# Patient Record
Sex: Female | Born: 1985
Health system: Southern US, Community
[De-identification: ages and names within clinical notes are randomized; demographics above are authoritative.]

## PROBLEM LIST (undated history)

## (undated) ENCOUNTER — Inpatient Hospital Stay (HOSPITAL_COMMUNITY): Payer: Self-pay

## (undated) DIAGNOSIS — E282 Polycystic ovarian syndrome: Secondary | ICD-10-CM

## (undated) DIAGNOSIS — D509 Iron deficiency anemia, unspecified: Secondary | ICD-10-CM

## (undated) DIAGNOSIS — D649 Anemia, unspecified: Secondary | ICD-10-CM

## (undated) DIAGNOSIS — Z9889 Other specified postprocedural states: Secondary | ICD-10-CM

## (undated) DIAGNOSIS — K5090 Crohn's disease, unspecified, without complications: Secondary | ICD-10-CM

## (undated) DIAGNOSIS — Z789 Other specified health status: Secondary | ICD-10-CM

## (undated) DIAGNOSIS — Z5189 Encounter for other specified aftercare: Secondary | ICD-10-CM

## (undated) DIAGNOSIS — F419 Anxiety disorder, unspecified: Secondary | ICD-10-CM

## (undated) HISTORY — PX: WISDOM TOOTH EXTRACTION: SHX21

## (undated) HISTORY — DX: Encounter for other specified aftercare: Z51.89

## (undated) HISTORY — DX: Anemia, unspecified: D64.9

## (undated) HISTORY — PX: DILATION AND CURETTAGE OF UTERUS: SHX78

## (undated) HISTORY — DX: Anxiety disorder, unspecified: F41.9

---
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
## 2003-05-03 ENCOUNTER — Inpatient Hospital Stay: Admission: AD | Admit: 2003-05-03 | Discharge: 2003-05-03 | Payer: Self-pay | Admitting: Obstetrics

## 2004-04-07 ENCOUNTER — Inpatient Hospital Stay (HOSPITAL_COMMUNITY): Admission: AD | Admit: 2004-04-07 | Discharge: 2004-04-07 | Payer: Self-pay | Admitting: Obstetrics

## 2005-12-08 ENCOUNTER — Emergency Department (HOSPITAL_COMMUNITY): Admission: EM | Admit: 2005-12-08 | Discharge: 2005-12-08 | Payer: Self-pay | Admitting: Family Medicine

## 2006-01-25 ENCOUNTER — Emergency Department (HOSPITAL_COMMUNITY): Admission: EM | Admit: 2006-01-25 | Discharge: 2006-01-25 | Payer: Self-pay | Admitting: Family Medicine

## 2008-01-28 ENCOUNTER — Other Ambulatory Visit: Payer: Self-pay | Admitting: Family Medicine

## 2008-01-28 ENCOUNTER — Other Ambulatory Visit: Payer: Self-pay | Admitting: Emergency Medicine

## 2008-01-28 ENCOUNTER — Inpatient Hospital Stay (HOSPITAL_COMMUNITY): Admission: AD | Admit: 2008-01-28 | Discharge: 2008-01-28 | Payer: Self-pay | Admitting: Obstetrics & Gynecology

## 2008-02-09 ENCOUNTER — Inpatient Hospital Stay (HOSPITAL_COMMUNITY): Admission: AD | Admit: 2008-02-09 | Discharge: 2008-02-09 | Payer: Self-pay | Admitting: Obstetrics & Gynecology

## 2008-04-25 ENCOUNTER — Ambulatory Visit (HOSPITAL_COMMUNITY): Admission: RE | Admit: 2008-04-25 | Discharge: 2008-04-25 | Payer: Self-pay | Admitting: Obstetrics

## 2008-05-21 ENCOUNTER — Inpatient Hospital Stay (HOSPITAL_COMMUNITY): Admission: AD | Admit: 2008-05-21 | Discharge: 2008-05-21 | Payer: Self-pay | Admitting: Obstetrics

## 2008-07-06 ENCOUNTER — Inpatient Hospital Stay (HOSPITAL_COMMUNITY): Admission: AD | Admit: 2008-07-06 | Discharge: 2008-07-06 | Payer: Self-pay | Admitting: Obstetrics & Gynecology

## 2008-07-06 ENCOUNTER — Ambulatory Visit: Payer: Self-pay | Admitting: Advanced Practice Midwife

## 2008-09-24 ENCOUNTER — Inpatient Hospital Stay (HOSPITAL_COMMUNITY): Admission: RE | Admit: 2008-09-24 | Discharge: 2008-09-29 | Payer: Self-pay | Admitting: Obstetrics

## 2009-11-20 ENCOUNTER — Encounter: Admission: RE | Admit: 2009-11-20 | Discharge: 2009-11-20 | Payer: Self-pay | Admitting: Infectious Diseases

## 2010-05-22 LAB — CROSSMATCH
ABO/RH(D): AB POS
Antibody Screen: NEGATIVE

## 2010-05-22 LAB — COMPREHENSIVE METABOLIC PANEL
ALT: 27 U/L (ref 0–35)
Alkaline Phosphatase: 140 U/L — ABNORMAL HIGH (ref 39–117)
BUN: 8 mg/dL (ref 6–23)
CO2: 20 mEq/L (ref 19–32)
GFR calc non Af Amer: >60 mL/min (ref >60)
Glucose, Bld: 95 mg/dL (ref 70–99)
Potassium: 4.2 mEq/L (ref 3.5–5.1)
Sodium: 137 mEq/L (ref 135–145)
Total Bilirubin: 0.5 mg/dL (ref 0.3–1.2)

## 2010-05-22 LAB — CBC
HCT: 21.2 % — ABNORMAL LOW (ref 36.0–46.0)
HCT: 34.1 % — ABNORMAL LOW (ref 36.0–46.0)
Hemoglobin: 11.5 g/dL — ABNORMAL LOW (ref 12.0–15.0)
Hemoglobin: 5.5 g/dL — CL (ref 12.0–15.0)
MCHC: 33.4 g/dL (ref 30.0–36.0)
MCHC: 34 g/dL (ref 30.0–36.0)
MCV: 89 fL (ref 78.0–100.0)
MCV: 86.8 fL (ref 78.0–100.0)
Platelets: 193 10*3/uL (ref 150–400)
Platelets: 187 10*3/uL (ref 150–400)
Platelets: 216 10*3/uL (ref 150–400)
RBC: 2.39 MIL/uL — ABNORMAL LOW (ref 3.87–5.11)
RBC: 1.88 MIL/uL — ABNORMAL LOW (ref 3.87–5.11)
RDW: 14.3 % (ref 11.5–15.5)
WBC: 13.6 10*3/uL — ABNORMAL HIGH (ref 4.0–10.5)
WBC: 15 10*3/uL — ABNORMAL HIGH (ref 4.0–10.5)
WBC: 6.8 10*3/uL (ref 4.0–10.5)

## 2010-05-22 LAB — APTT: aPTT: 26 seconds (ref 24–37)

## 2010-05-22 LAB — PROTIME-INR
INR: 0.9 (ref 0.00–1.49)
Prothrombin Time: 12.5 seconds (ref 11.6–15.2)

## 2010-05-22 LAB — LACTATE DEHYDROGENASE: LDH: 250 U/L (ref 94–250)

## 2010-05-22 LAB — URIC ACID: Uric Acid, Serum: 3.4 mg/dL (ref 2.4–7.0)

## 2010-05-25 LAB — URINALYSIS, ROUTINE W REFLEX MICROSCOPIC
Glucose, UA: NEGATIVE mg/dL
Protein, ur: NEGATIVE mg/dL
Specific Gravity, Urine: 1.01 (ref 1.005–1.030)
Urobilinogen, UA: 0.2 mg/dL (ref 0.0–1.0)

## 2010-05-25 LAB — WET PREP, GENITAL

## 2010-05-26 LAB — URINALYSIS, ROUTINE W REFLEX MICROSCOPIC
Ketones, ur: NEGATIVE mg/dL
Nitrite: NEGATIVE
Protein, ur: NEGATIVE mg/dL

## 2010-07-02 NOTE — Op Note (Signed)
[facility]  Patient:    Michelle Pacheco                       MRN: [id number] Attending:  Kathreen Cosier, M.D.                           Operative Report  PREOPERATIVE DIAGNOSIS:       Fetal demise at 8 weeks.  POSTOPERATIVE DIAGNOSIS:  PROCEDURE:                    Dilatation and evacuation.  SURGEON:                      Kathreen Cosier, M.D.  ANESTHESIA:                   MAC.  DESCRIPTION OF PROCEDURE:     Using MAC with the patient in lithotomy position, the perineum was prepped and draped and the bladder emptied with a straight catheter. Bimanual exam revealed the uterus to be 8-week size. A weighted speculum was placed in the vagina. The cervix was injected with 1% Xylocaine with a total of 7 cc at 3, 9, and 12 oclock. Anterior lip of the cervix was grasped with a tenaculum. The cervix was dilated to #30 Shawnie Pons and a #9 suction used to aspirate uterine contents until cavity was clean. The patient tolerated the procedure well and taken to the recovery room in good condition. DD:  05/16/00 TD:  05/16/00 Job: [id number]

## 2010-11-18 LAB — HCG, QUANTITATIVE, PREGNANCY: hCG, Beta Chain, Quant, S: 60824 m[IU]/mL — ABNORMAL HIGH (ref <5)

## 2010-11-18 LAB — WET PREP, GENITAL
Clue Cells Wet Prep HPF POC: NONE SEEN
Trich, Wet Prep: NONE SEEN

## 2010-11-18 LAB — GC/CHLAMYDIA PROBE AMP, GENITAL: GC Probe Amp, Genital: NEGATIVE

## 2010-11-18 LAB — CBC
MCHC: 33.9 g/dL (ref 30.0–36.0)
Platelets: 218 10*3/uL (ref 150–400)
RDW: 14.1 % (ref 11.5–15.5)

## 2010-11-19 LAB — HCG, QUANTITATIVE, PREGNANCY: hCG, Beta Chain, Quant, S: 59101 m[IU]/mL — ABNORMAL HIGH (ref <5)

## 2010-11-19 LAB — POCT URINALYSIS DIP (DEVICE)
Glucose, UA: NEGATIVE mg/dL
Nitrite: NEGATIVE
Urobilinogen, UA: 0.2 mg/dL (ref 0.0–1.0)

## 2010-11-19 LAB — GC/CHLAMYDIA PROBE AMP, GENITAL: GC Probe Amp, Genital: NEGATIVE

## 2010-11-19 LAB — POCT PREGNANCY, URINE: Preg Test, Ur: POSITIVE

## 2010-11-19 LAB — POCT I-STAT, CHEM 8
Chloride: 103 mEq/L (ref 96–112)
Glucose, Bld: 83 mg/dL (ref 70–99)
HCT: 45 % (ref 36.0–46.0)
Potassium: 4 mEq/L (ref 3.5–5.1)

## 2010-11-19 LAB — WET PREP, GENITAL

## 2011-03-10 ENCOUNTER — Other Ambulatory Visit (HOSPITAL_COMMUNITY)
Admission: RE | Admit: 2011-03-10 | Discharge: 2011-03-10 | Disposition: A | Discharge status: Home / Self Care | Payer: Commercial Managed Care - PPO | Source: Ambulatory Visit | Attending: Obstetrics and Gynecology | Admitting: Obstetrics and Gynecology

## 2011-03-10 ENCOUNTER — Other Ambulatory Visit: Payer: Self-pay | Admitting: Nurse Practitioner

## 2011-03-10 DIAGNOSIS — Z1159 Encounter for screening for other viral diseases: Secondary | ICD-10-CM | POA: Insufficient documentation

## 2011-03-10 DIAGNOSIS — Z113 Encounter for screening for infections with a predominantly sexual mode of transmission: Secondary | ICD-10-CM | POA: Insufficient documentation

## 2011-03-10 DIAGNOSIS — Z01419 Encounter for gynecological examination (general) (routine) without abnormal findings: Secondary | ICD-10-CM | POA: Insufficient documentation

## 2011-09-26 LAB — OB RESULTS CONSOLE HEPATITIS B SURFACE ANTIGEN: Hepatitis B Surface Ag: NEGATIVE

## 2011-10-31 ENCOUNTER — Other Ambulatory Visit: Payer: Self-pay | Admitting: Obstetrics

## 2011-10-31 DIAGNOSIS — Z0489 Encounter for examination and observation for other specified reasons: Secondary | ICD-10-CM

## 2011-10-31 DIAGNOSIS — IMO0002 Reserved for concepts with insufficient information to code with codable children: Secondary | ICD-10-CM

## 2011-11-14 ENCOUNTER — Ambulatory Visit (HOSPITAL_COMMUNITY)
Admission: RE | Admit: 2011-11-14 | Discharge: 2011-11-14 | Disposition: A | Discharge status: Home / Self Care | Payer: Commercial Managed Care - PPO | Source: Ambulatory Visit | Attending: Obstetrics | Admitting: Obstetrics

## 2011-11-14 DIAGNOSIS — Z1389 Encounter for screening for other disorder: Secondary | ICD-10-CM | POA: Insufficient documentation

## 2011-11-14 DIAGNOSIS — IMO0002 Reserved for concepts with insufficient information to code with codable children: Secondary | ICD-10-CM

## 2011-11-14 DIAGNOSIS — Z0489 Encounter for examination and observation for other specified reasons: Secondary | ICD-10-CM

## 2011-11-14 DIAGNOSIS — O358XX0 Maternal care for other (suspected) fetal abnormality and damage, not applicable or unspecified: Secondary | ICD-10-CM | POA: Insufficient documentation

## 2011-11-14 DIAGNOSIS — Z363 Encounter for antenatal screening for malformations: Secondary | ICD-10-CM | POA: Insufficient documentation

## 2012-01-23 ENCOUNTER — Other Ambulatory Visit: Payer: Self-pay | Admitting: Obstetrics

## 2012-01-23 DIAGNOSIS — IMO0002 Reserved for concepts with insufficient information to code with codable children: Secondary | ICD-10-CM

## 2012-01-23 DIAGNOSIS — O359XX0 Maternal care for (suspected) fetal abnormality and damage, unspecified, not applicable or unspecified: Secondary | ICD-10-CM

## 2012-01-30 ENCOUNTER — Ambulatory Visit (HOSPITAL_COMMUNITY): Payer: Commercial Managed Care - PPO

## 2012-02-06 ENCOUNTER — Ambulatory Visit (HOSPITAL_COMMUNITY)
Admission: RE | Admit: 2012-02-06 | Discharge: 2012-02-06 | Disposition: A | Discharge status: Home / Self Care | Payer: Commercial Managed Care - PPO | Source: Ambulatory Visit | Attending: Obstetrics | Admitting: Obstetrics

## 2012-02-06 DIAGNOSIS — O358XX0 Maternal care for other (suspected) fetal abnormality and damage, not applicable or unspecified: Secondary | ICD-10-CM | POA: Insufficient documentation

## 2012-02-06 DIAGNOSIS — IMO0002 Reserved for concepts with insufficient information to code with codable children: Secondary | ICD-10-CM

## 2012-02-06 DIAGNOSIS — O3660X0 Maternal care for excessive fetal growth, unspecified trimester, not applicable or unspecified: Secondary | ICD-10-CM | POA: Insufficient documentation

## 2012-02-06 DIAGNOSIS — O09299 Supervision of pregnancy with other poor reproductive or obstetric history, unspecified trimester: Secondary | ICD-10-CM | POA: Insufficient documentation

## 2012-02-15 NOTE — L&D Delivery Note (Signed)
Delivery Note At 11:55 PM a viable female was delivered via Vaginal, Spontaneous Delivery (Presentation: ROA ;  ).  APGAR: 9, 9; weight: 3250 grams.   Placenta status: Intact, Spontaneous.  Cord: 3 vessels with the following complications: None.  Cord pH: none  Anesthesia: None Episiotomy: None Lacerations: None Suture Repair: none Est. Blood Loss (mL): 400  Mom to postpartum.  Baby to nursery-stable.  HARPER,CHARLES A 04/18/2012, 12:29 AM

## 2012-03-09 LAB — OB RESULTS CONSOLE GBS: GBS: POSITIVE

## 2012-04-09 ENCOUNTER — Encounter (HOSPITAL_COMMUNITY): Payer: Self-pay | Admitting: *Deleted

## 2012-04-09 ENCOUNTER — Inpatient Hospital Stay (HOSPITAL_COMMUNITY)
Admission: AD | Admit: 2012-04-09 | Discharge: 2012-04-09 | Disposition: A | Discharge status: Home / Self Care | Payer: Commercial Managed Care - PPO | Source: Ambulatory Visit | Attending: Obstetrics & Gynecology | Admitting: Obstetrics & Gynecology

## 2012-04-09 DIAGNOSIS — O99891 Other specified diseases and conditions complicating pregnancy: Secondary | ICD-10-CM | POA: Insufficient documentation

## 2012-04-09 HISTORY — DX: Other specified health status: Z78.9

## 2012-04-09 NOTE — MAU Note (Signed)
Per Dorrene German, RN. Patient arrived in MAU with saturated clothing with SROM of clear fluid. Patient will be sent to 168. Report given to Earle Gell, CNM

## 2012-04-09 NOTE — Progress Notes (Signed)
Dr. Tamela Oddi notified that pt amni-sure negative.  Instructed to send pt home with labor precautions.

## 2012-04-11 ENCOUNTER — Telehealth (HOSPITAL_COMMUNITY): Payer: Self-pay | Admitting: *Deleted

## 2012-04-11 NOTE — Telephone Encounter (Signed)
Preadmission screen  

## 2012-04-12 ENCOUNTER — Encounter (HOSPITAL_COMMUNITY): Payer: Self-pay | Admitting: *Deleted

## 2012-04-12 ENCOUNTER — Telehealth (HOSPITAL_COMMUNITY): Payer: Self-pay | Admitting: *Deleted

## 2012-04-12 NOTE — Telephone Encounter (Signed)
Preadmission screen  

## 2012-04-17 ENCOUNTER — Inpatient Hospital Stay (HOSPITAL_COMMUNITY)
Admission: RE | Admit: 2012-04-17 | Discharge: 2012-04-19 | DRG: 775 | Disposition: A | Discharge status: Home / Self Care | Payer: Commercial Managed Care - PPO | Source: Ambulatory Visit | Attending: Obstetrics | Admitting: Obstetrics

## 2012-04-17 ENCOUNTER — Encounter (HOSPITAL_COMMUNITY): Payer: Self-pay

## 2012-04-17 DIAGNOSIS — Z22330 Carrier of Group B streptococcus: Secondary | ICD-10-CM

## 2012-04-17 DIAGNOSIS — O99892 Other specified diseases and conditions complicating childbirth: Secondary | ICD-10-CM | POA: Diagnosis present

## 2012-04-17 DIAGNOSIS — O480 Post-term pregnancy: Principal | ICD-10-CM | POA: Diagnosis present

## 2012-04-17 LAB — TYPE AND SCREEN: ABO/RH(D): AB POS

## 2012-04-17 LAB — CBC
MCH: 27.1 pg (ref 26.0–34.0)
MCHC: 32.9 g/dL (ref 30.0–36.0)
Platelets: 192 10*3/uL (ref 150–400)
RBC: 4.31 MIL/uL (ref 3.87–5.11)
RDW: 14.8 % (ref 11.5–15.5)

## 2012-04-17 LAB — RPR: RPR Ser Ql: NONREACTIVE

## 2012-04-17 MED ORDER — OXYTOCIN 40 UNITS IN LACTATED RINGERS INFUSION - SIMPLE MED
1.0000 m[IU]/min | INTRAVENOUS | Status: DC
  Administered 2012-04-17: 1 m[IU]/min via INTRAVENOUS
  Filled 2012-04-17: qty 1000

## 2012-04-17 MED ORDER — TERBUTALINE SULFATE 1 MG/ML IJ SOLN: 0.2500 mg | Freq: Once | INTRAMUSCULAR | Status: AC | PRN

## 2012-04-17 MED ORDER — LACTATED RINGERS IV SOLN: 500.0000 mL | INTRAVENOUS | Status: DC | PRN

## 2012-04-17 MED ORDER — CITRIC ACID-SODIUM CITRATE 334-500 MG/5ML PO SOLN: 30.0000 mL | ORAL | Status: DC | PRN

## 2012-04-17 MED ORDER — IBUPROFEN 600 MG PO TABS
600.0000 mg | ORAL_TABLET | Freq: Four times a day (QID) | ORAL | Status: DC | PRN
  Administered 2012-04-18: 600 mg via ORAL
  Filled 2012-04-17: qty 1

## 2012-04-17 MED ORDER — ONDANSETRON HCL 4 MG/2ML IJ SOLN: 4.0000 mg | Freq: Four times a day (QID) | INTRAMUSCULAR | Status: DC | PRN

## 2012-04-17 MED ORDER — OXYTOCIN 40 UNITS IN LACTATED RINGERS INFUSION - SIMPLE MED
62.5000 mL/h | INTRAVENOUS | Status: DC
  Administered 2012-04-18: 62.5 mL/h via INTRAVENOUS

## 2012-04-17 MED ORDER — BUTORPHANOL TARTRATE 1 MG/ML IJ SOLN
1.0000 mg | INTRAMUSCULAR | Status: DC | PRN
  Filled 2012-04-17: qty 1

## 2012-04-17 MED ORDER — HYDROXYZINE HCL 50 MG/ML IM SOLN: 50.0000 mg | Freq: Four times a day (QID) | INTRAMUSCULAR | Status: DC | PRN

## 2012-04-17 MED ORDER — OXYTOCIN BOLUS FROM INFUSION
500.0000 mL | INTRAVENOUS | Status: DC
  Administered 2012-04-18: 500 mL via INTRAVENOUS

## 2012-04-17 MED ORDER — DEXTROSE 5 % IV SOLN
5.0000 10*6.[IU] | Freq: Once | INTRAVENOUS | Status: AC
  Administered 2012-04-17: 5 10*6.[IU] via INTRAVENOUS
  Filled 2012-04-17: qty 5

## 2012-04-17 MED ORDER — LACTATED RINGERS IV SOLN
INTRAVENOUS | Status: DC
  Administered 2012-04-17: 125 mL via INTRAVENOUS

## 2012-04-17 MED ORDER — HYDROXYZINE HCL 50 MG PO TABS: 50.0000 mg | ORAL_TABLET | Freq: Four times a day (QID) | ORAL | Status: DC | PRN

## 2012-04-17 MED ORDER — ACETAMINOPHEN 325 MG PO TABS: 650.0000 mg | ORAL_TABLET | ORAL | Status: DC | PRN

## 2012-04-17 MED ORDER — MISOPROSTOL 25 MCG QUARTER TABLET
25.0000 ug | ORAL_TABLET | ORAL | Status: DC | PRN
  Administered 2012-04-17: 25 ug via VAGINAL
  Filled 2012-04-17: qty 0.25

## 2012-04-17 MED ORDER — LIDOCAINE HCL (PF) 1 % IJ SOLN
30.0000 mL | INTRAMUSCULAR | Status: DC | PRN
  Filled 2012-04-17: qty 30

## 2012-04-17 MED ORDER — PENICILLIN G POTASSIUM 5000000 UNITS IJ SOLR
2.5000 10*6.[IU] | INTRAVENOUS | Status: DC
  Administered 2012-04-17 (×3): 2.5 10*6.[IU] via INTRAVENOUS
  Filled 2012-04-17 (×8): qty 2.5

## 2012-04-17 MED ORDER — OXYCODONE-ACETAMINOPHEN 5-325 MG PO TABS
1.0000 | ORAL_TABLET | ORAL | Status: DC | PRN
  Filled 2012-04-17: qty 1

## 2012-04-17 NOTE — Progress Notes (Signed)
Told pt dr. Clearance Coots recommended starting low dose pitocin not to exceed 6mu for progression of labor. Pt. Would like to wait another hour or so and see if contractions increase in strength and frequency on their own, if not she is in agreement for pitocin.

## 2012-04-17 NOTE — Progress Notes (Signed)
Michelle Pacheco is a 27 y.o. [id number] at [redacted]w[redacted]d by LMP admitted for induction of labor due to Post dates. Due date 04-15-12.  Subjective:   Objective: BP 128/78  Pulse 80  Temp(Src) 98 F (36.7 C) (Oral)  Resp 20  Ht 5\' 3"  (1.6 m)  Wt 228 lb (103.42 kg)  BMI 40.4 kg/m2      FHT:  FHR: 150 bpm, variability: moderate,  accelerations:  Present,  decelerations:  Absent UC:   regular, every 3-4 minutes SVE:   Dilation: 5.5 Effacement (%): 70 Station: -1 Exam by:: T. Sprauge RN  Labs: Lab Results  Component Value Date   WBC 5.4 04/17/2012   HGB 11.7* 04/17/2012   HCT 35.6* 04/17/2012   MCV 82.6 04/17/2012   PLT 192 04/17/2012    Assessment / Plan: Induction of labor due to postdates,  progressing well on pitocin  Labor: Progressing normally Preeclampsia:  n/a Fetal Wellbeing:  Category I Pain Control:  Nubain I/D:  n/a Anticipated MOD:  NSVD  HARPER,CHARLES A 04/17/2012, 11:09 PM

## 2012-04-17 NOTE — Progress Notes (Signed)
Discussed options for progression of labor per pt request.  Discussed use of cytotec after SROM and the chance it may not work effectively due to leaking of fluid. Also discussed use of low dose pitocin to increase frequency and strength of uc's.  Will discuss poc with dr. Clearance Coots .

## 2012-04-17 NOTE — Progress Notes (Signed)
Michelle Pacheco is a 27 y.o. [id number] at [redacted]w[redacted]d by LMP admitted for induction of labor due to Post dates. Due date 04-15-12.  Subjective:   Objective: BP 123/69  Pulse 74  Temp(Src) 98.6 F (37 C) (Oral)  Ht 5\' 3"  (1.6 m)  Wt 228 lb (103.42 kg)  BMI 40.4 kg/m2      FHT:  FHR: 150-160 bpm, variability: moderate,  accelerations:  Present,  decelerations:  Absent UC:   regular, every 2-5 minutes SVE:   Dilation: 2.5 Effacement (%): 60 Station: -3 Exam by:: Algis Greenhouse, RN   Labs: Lab Results  Component Value Date   WBC 5.4 04/17/2012   HGB 11.7* 04/17/2012   HCT 35.6* 04/17/2012   MCV 82.6 04/17/2012   PLT 192 04/17/2012    Assessment / Plan: Induction of labor due to postdates,  progressing well on pitocin  Labor: Progressing normally Preeclampsia:  n/a Fetal Wellbeing:  Category I Pain Control:  Nubain I/D:  n/a Anticipated MOD:  NSVD  HARPER,CHARLES A 04/17/2012, 5:35 PM

## 2012-04-17 NOTE — H&P (Signed)
Michelle Pacheco is a 27 y.o. female presenting for IOL. Maternal Medical History:  Reason for admission: 43 y o G4 P42.  EDC  04-15-12.  Presents for IOL for postdates.  Fetal activity: Perceived fetal activity is normal.   Last perceived fetal movement was within the past hour.    Prenatal complications: no prenatal complications Prenatal Complications - Diabetes: none.    OB History   Grav Para Term Preterm Abortions TAB SAB Ect Mult Living   4 1 1  0 2     1     Past Medical History  Diagnosis Date  . Medical history non-contributory   . Blood transfusion without reported diagnosis    Past Surgical History  Procedure Laterality Date  . Dilation and curettage of uterus     Family History: family history includes Asthma in her brother; Cancer in her maternal grandfather; Diabetes in her father and maternal grandfather; Hypertension in her maternal grandfather; and Multiple sclerosis in her mother. Social History:  reports that she quit smoking about 7 months ago. She has never used smokeless tobacco. She reports that she does not drink alcohol or use illicit drugs.   Prenatal Transfer Tool  Maternal Diabetes: No Genetic Screening: Normal Maternal Ultrasounds/Referrals: Normal Fetal Ultrasounds or other Referrals:  None Maternal Substance Abuse:  No Significant Maternal Medications:  PNV Significant Maternal Lab Results:  GBS positive Other Comments:  None  ROS    Blood pressure 125/69, pulse 89. Maternal Exam:  Abdomen: Patient reports no abdominal tenderness. Fetal presentation: vertex  Introitus: Normal vulva. Normal vagina.  Pelvis: adequate for delivery.      Physical Exam  Nursing note and vitals reviewed. Constitutional: She is oriented to person, place, and time. She appears well-developed and well-nourished.  HENT:  Head: Normocephalic and atraumatic.  Eyes: Conjunctivae are normal. Pupils are equal, round, and reactive to light.  Neck: Normal range  of motion. Neck supple.  Cardiovascular: Normal rate and regular rhythm.   Respiratory: Effort normal and breath sounds normal.  GI: Soft.  Genitourinary: Vagina normal and uterus normal.  Musculoskeletal: Normal range of motion.  Neurological: She is alert and oriented to person, place, and time.  Skin: Skin is warm and dry.  Psychiatric: She has a normal mood and affect. Her behavior is normal. Judgment and thought content normal.    Prenatal labs: ABO, Rh:   Antibody:   Rubella: Immune (08/12 0000) RPR: Nonreactive (08/12 0000)  HBsAg: Negative (08/12 0000)  HIV: Non-reactive (08/12 0000)  GBS: Positive (01/24 0000)   Assessment/Plan: 40.2 weeks.  IOL.  Cytotec started.   HARPER,CHARLES A 04/17/2012, 9:45 AM

## 2012-04-17 NOTE — Progress Notes (Signed)
Michelle Pacheco is a 27 y.o. [id number] at [redacted]w[redacted]d by LMP admitted for induction of labor due to Post dates. Due date 04-15-12.  Subjective:   Objective: BP 125/69  Pulse 89      FHT:  FHR: 150 bpm, variability: moderate,  accelerations:  Present,  decelerations:  Absent UC:   none SVE:   Dilation: 2.5 Effacement (%): 60 Station: -3 Exam by:: Michelle Greenhouse, RN   Labs: Lab Results  Component Value Date   WBC 5.4 04/17/2012   HGB 11.7* 04/17/2012   HCT 35.6* 04/17/2012   MCV 82.6 04/17/2012   PLT 192 04/17/2012    Assessment / Plan: Postdates.  IOL.  Cytotec cervical ripening.  Labor: none Preeclampsia:  n/a Fetal Wellbeing:  Category I Pain Control:  Nubain I/D:  n/a Anticipated MOD:  NSVD  Michelle Pacheco A 04/17/2012, 9:56 AM

## 2012-04-18 ENCOUNTER — Encounter (HOSPITAL_COMMUNITY): Payer: Self-pay

## 2012-04-18 LAB — CBC
MCV: 82.2 fL (ref 78.0–100.0)
Platelets: 194 10*3/uL (ref 150–400)
RBC: 4.15 MIL/uL (ref 3.87–5.11)
RDW: 14.7 % (ref 11.5–15.5)
WBC: 10.1 10*3/uL (ref 4.0–10.5)

## 2012-04-18 LAB — CCBB MATERNAL DONOR DRAW

## 2012-04-18 MED ORDER — LANOLIN HYDROUS EX OINT: TOPICAL_OINTMENT | CUTANEOUS | Status: DC | PRN

## 2012-04-18 MED ORDER — TETANUS-DIPHTH-ACELL PERTUSSIS 5-2.5-18.5 LF-MCG/0.5 IM SUSP: 0.5000 mL | Freq: Once | INTRAMUSCULAR | Status: DC

## 2012-04-18 MED ORDER — METHYLERGONOVINE MALEATE 0.2 MG/ML IJ SOLN: 0.2000 mg | Freq: Three times a day (TID) | INTRAMUSCULAR | Status: DC

## 2012-04-18 MED ORDER — WITCH HAZEL-GLYCERIN EX PADS: 1.0000 "application " | MEDICATED_PAD | CUTANEOUS | Status: DC | PRN

## 2012-04-18 MED ORDER — SENNOSIDES-DOCUSATE SODIUM 8.6-50 MG PO TABS
2.0000 | ORAL_TABLET | Freq: Every day | ORAL | Status: DC
  Administered 2012-04-18: 2 via ORAL

## 2012-04-18 MED ORDER — ONDANSETRON HCL 4 MG PO TABS: 4.0000 mg | ORAL_TABLET | ORAL | Status: DC | PRN

## 2012-04-18 MED ORDER — DIPHENHYDRAMINE HCL 25 MG PO CAPS: 25.0000 mg | ORAL_CAPSULE | Freq: Four times a day (QID) | ORAL | Status: DC | PRN

## 2012-04-18 MED ORDER — OXYCODONE-ACETAMINOPHEN 5-325 MG PO TABS: 1.0000 | ORAL_TABLET | ORAL | Status: DC | PRN

## 2012-04-18 MED ORDER — SIMETHICONE 80 MG PO CHEW: 80.0000 mg | CHEWABLE_TABLET | ORAL | Status: DC | PRN

## 2012-04-18 MED ORDER — BENZOCAINE-MENTHOL 20-0.5 % EX AERO: 1.0000 "application " | INHALATION_SPRAY | CUTANEOUS | Status: DC | PRN

## 2012-04-18 MED ORDER — ONDANSETRON HCL 4 MG/2ML IJ SOLN: 4.0000 mg | INTRAMUSCULAR | Status: DC | PRN

## 2012-04-18 MED ORDER — ZOLPIDEM TARTRATE 5 MG PO TABS: 5.0000 mg | ORAL_TABLET | Freq: Every evening | ORAL | Status: DC | PRN

## 2012-04-18 MED ORDER — METHYLERGONOVINE MALEATE 0.2 MG/ML IJ SOLN
0.2000 mg | Freq: Once | INTRAMUSCULAR | Status: AC
  Administered 2012-04-18: 0.2 mg via INTRAMUSCULAR

## 2012-04-18 MED ORDER — OXYTOCIN 40 UNITS IN LACTATED RINGERS INFUSION - SIMPLE MED
62.5000 mL/h | INTRAVENOUS | Status: DC | PRN
  Administered 2012-04-18: 62.5 mL/h via INTRAVENOUS
  Filled 2012-04-18: qty 1000

## 2012-04-18 MED ORDER — METHYLERGONOVINE MALEATE 0.2 MG/ML IJ SOLN
INTRAMUSCULAR | Status: AC
  Filled 2012-04-18: qty 1

## 2012-04-18 MED ORDER — PRENATAL MULTIVITAMIN CH
1.0000 | ORAL_TABLET | Freq: Every day | ORAL | Status: DC
  Administered 2012-04-18 – 2012-04-19 (×2): 1 via ORAL
  Filled 2012-04-18 (×2): qty 1

## 2012-04-18 MED ORDER — DIBUCAINE 1 % RE OINT: 1.0000 "application " | TOPICAL_OINTMENT | RECTAL | Status: DC | PRN

## 2012-04-18 MED ORDER — METHYLERGONOVINE MALEATE 0.2 MG PO TABS
0.2000 mg | ORAL_TABLET | Freq: Three times a day (TID) | ORAL | Status: DC
  Administered 2012-04-18 (×3): 0.2 mg via ORAL
  Filled 2012-04-18 (×3): qty 1

## 2012-04-18 MED ORDER — IBUPROFEN 600 MG PO TABS
600.0000 mg | ORAL_TABLET | Freq: Four times a day (QID) | ORAL | Status: DC
  Administered 2012-04-18 – 2012-04-19 (×6): 600 mg via ORAL
  Filled 2012-04-18 (×5): qty 1

## 2012-04-19 MED ORDER — DOCUSATE SODIUM 283 MG RE ENEM: 1.0000 | ENEMA | Freq: Once | RECTAL | Status: DC

## 2012-04-19 MED ORDER — OXYCODONE-ACETAMINOPHEN 5-325 MG PO TABS: 1.0000 | ORAL_TABLET | ORAL | Status: DC | PRN

## 2012-04-19 MED ORDER — DIPHENHYDRAMINE HCL 25 MG PO CAPS: 25.0000 mg | ORAL_CAPSULE | Freq: Four times a day (QID) | ORAL | Status: DC | PRN

## 2012-04-19 MED ORDER — MAGNESIUM HYDROXIDE 400 MG/5ML PO SUSP
30.0000 mL | Freq: Once | ORAL | Status: AC
  Administered 2012-04-19: 30 mL via ORAL
  Filled 2012-04-19: qty 30

## 2012-04-19 MED ORDER — BISACODYL 10 MG RE SUPP: 10.0000 mg | Freq: Once | RECTAL | Status: DC

## 2012-04-19 NOTE — Progress Notes (Signed)
Post Partum Day 2 Subjective: no complaints  Objective: Blood pressure 127/83, pulse 66, temperature 97.2 F (36.2 C), temperature source Oral, resp. rate 18, height 5\' 3"  (1.6 m), weight 228 lb (103.42 kg), unknown if currently breastfeeding.  Physical Exam:  General: alert and no distress Lochia: appropriate Uterine Fundus: firm Incision: none DVT Evaluation: No evidence of DVT seen on physical exam.   Recent Labs  04/17/12 0917 04/18/12 0545  HGB 11.7* 11.6*  HCT 35.6* 34.1*    Assessment/Plan: Discharge home   LOS: 2 days   Michelle Pacheco A 04/19/2012, 8:41 AM

## 2012-04-20 NOTE — Discharge Summary (Signed)
Obstetric Discharge Summary Reason for Admission: induction of labor Prenatal Procedures: ultrasound Intrapartum Procedures: spontaneous vaginal delivery Postpartum Procedures: none Complications-Operative and Postpartum: none Hemoglobin  Date Value Range Status  04/18/2012 11.6* 12.0 - 15.0 g/dL Final     HCT  Date Value Range Status  04/18/2012 34.1* 36.0 - 46.0 % Final    Physical Exam:  General: alert and no distress Lochia: appropriate Uterine Fundus: firm Incision: healing well DVT Evaluation: No evidence of DVT seen on physical exam.  Discharge Diagnoses: Term Pregnancy-delivered  Discharge Information: Date: 04/20/2012 Activity: pelvic rest Diet: routine Medications: PNV, Ibuprofen, Colace and Percocet Condition: stable Instructions: refer to practice specific booklet Discharge to: home Follow-up Information   Follow up with HARPER,CHARLES A, MD. Schedule an appointment as soon as possible for a visit in 6 weeks.   Contact information:   [address] [place] [address] [phone]       Newborn Data: Live born female  Birth Weight: 7 lb 2.6 oz (3250 g) APGAR: 9, 9  Home with mother.  HARPER,CHARLES A 04/20/2012, 9:17 AM

## 2012-05-02 ENCOUNTER — Ambulatory Visit (HOSPITAL_COMMUNITY)
Admission: RE | Admit: 2012-05-02 | Discharge: 2012-05-02 | Disposition: A | Discharge status: Home / Self Care | Payer: Commercial Managed Care - PPO | Source: Ambulatory Visit | Attending: Obstetrics | Admitting: Obstetrics

## 2012-05-02 NOTE — Lactation Note (Signed)
Adult Lactation Consultation Outpatient Visit Note  Patient Name: Michelle Pacheco(mother)   BABY: [name] Date of Birth: 12[birth year]13/1983                                 DOB: [birth year] Gestational Age at Delivery: [redacted]w[redacted]d             BIRTH WEIGHT: 7-2.6 Type of Delivery: NVD                                  DISCHARGE WEIGHT: 6-13.2  WEIGHT 04/23/12 6-5                                                                       WEIGHT TODAY: 6-13.5 Breastfeeding History: Frequency of Breastfeeding: successful latch 2 times per day/attempts at other feedings Length of Feeding:  Voids: QS Stools: QS  Supplementing / Method:BOTTLE/EBM 2- 3 OZ EVERY 3 HOURS Pumping:  Type of Pump:PUMP IN STYLE   Frequency:EVERY 3 HOURS  Volume:  2 OZ FROM EACH BREAST  Comments:    Consultation Evaluation:  Mom and 77 week old baby here for feeding assist.  Mom states her milk came in on day 3 and she became very engorged and needed to start pumping.  Once baby started taking EBM in bottle she started having difficulty with latch and baby fighting at breast with attempts.  Positioned baby in football hold and mom and FOB shown how good breast compression will assist in latch.  Baby opened wide and latched easily.  Baby nursed actively x 20 minutes with audible swallows.  Baby finished first breast and mom independently latched baby to right breast with ease.  A lot of basic teaching done and questions answered.  Baby came off second side content and relaxed.  Mom very pleased and states this is the most content the baby has been after breastfeeding.  Encouraged to call [facility] office prn and attend support group if possible.  Initial Feeding Assessment:20 MINUTES ON BOTH RIGHT AND LEFT BREAST Pre-feed Weight:3105 Post-feed [id number] Amount Transferred:63 MLS Comments:mom pumped breasts 1 hour prior to appointment  Additional Feeding Assessment: Pre-feed Weight: Post-feed Weight: Amount  Transferred: Comments:  Additional Feeding Assessment: Pre-feed Weight: Post-feed Weight: Amount Transferred: Comments:  Total Breast milk Transferred this Visit: 63 MLS Total Supplement Given: NONE  Additional Interventions:   Follow-Up  WILL CALL PRN      Hansel Feinstein 05/02/2012, 3:02 PM

## 2012-05-07 ENCOUNTER — Telehealth: Payer: Self-pay | Admitting: *Deleted

## 2012-05-07 NOTE — Telephone Encounter (Signed)
Call requesting delivery date and type from insurance company- claim # [id number]. Phone contact # [phone]. Call to insurance information relayed. Vaginal delivery- 04/17/2012.

## 2012-05-22 ENCOUNTER — Ambulatory Visit: Payer: Self-pay | Admitting: Obstetrics

## 2012-05-29 ENCOUNTER — Ambulatory Visit (INDEPENDENT_AMBULATORY_CARE_PROVIDER_SITE_OTHER): Payer: Commercial Managed Care - PPO | Admitting: Obstetrics

## 2012-05-29 ENCOUNTER — Encounter: Payer: Self-pay | Admitting: Obstetrics

## 2012-05-29 VITALS — BP 113/73 | HR 67 | Temp 97.2°F | Wt 205.0 lb

## 2012-05-29 DIAGNOSIS — Z3202 Encounter for pregnancy test, result negative: Secondary | ICD-10-CM

## 2012-05-29 DIAGNOSIS — Z309 Encounter for contraceptive management, unspecified: Secondary | ICD-10-CM

## 2012-05-29 LAB — POCT URINE PREGNANCY: Preg Test, Ur: NEGATIVE

## 2012-05-29 MED ORDER — OB COMPLETE PETITE 35-5-1-200 MG PO CAPS: 1.0000 | ORAL_CAPSULE | Freq: Every day | ORAL | Status: DC

## 2012-05-29 MED ORDER — IBUPROFEN 800 MG PO TABS: 800.0000 mg | ORAL_TABLET | Freq: Three times a day (TID) | ORAL | Status: DC | PRN

## 2012-05-29 NOTE — Patient Instructions (Signed)
Management of hip pain.

## 2012-05-29 NOTE — Progress Notes (Signed)
Subjective:     Michelle Pacheco is a 27 y.o. female who presents for a postpartum visit. She is 6 weeks postpartum following a spontaneous vaginal delivery. I have fully reviewed the prenatal and intrapartum course. The delivery was at 40 gestational weeks. Outcome: spontaneous vaginal delivery. Anesthesia: none. Postpartum course has been normal. Baby's course has been normal. Baby is feeding by breast. Bleeding pink. Bowel function is abnormal: constipation. Bladder function is normal. Patient is sexually active. Contraception method is condoms. Postpartum depression screening: Negative.  The following portions of the patient's history were reviewed and updated as appropriate: allergies, current medications, past family history, past medical history, past social history, past surgical history and problem list.  Review of Systems Pertinent items are noted in HPI.  Objective:    BP 113/73  Pulse 67  Temp(Src) 97.2 F (36.2 C) (Oral)  Wt 205 lb (92.987 kg)  BMI 36.32 kg/m2  Breastfeeding? Yes  General:  alert and no distress   Breasts:  inspection negative, no nipple discharge or bleeding, no masses or nodularity palpable  Lungs: not examined  Heart:  not examined  Abdomen: normal findings: soft, non-tender   Vulva:  normal  Vagina: normal vagina  Cervix:  no lesions  Corpus: normal size, contour, position, consistency, mobility, non-tender  Adnexa:  normal adnexa  Rectal Exam: Not performed.       Assessment:   Normal  postpartum exam. Pap smear not done at today's visit.  Hip pain.  Plan:   1. Contraception: condoms 2. F/U in 6 weeks. 3. Ibuprofen Rx

## 2012-06-04 ENCOUNTER — Ambulatory Visit: Payer: Self-pay | Admitting: Obstetrics

## 2012-06-05 ENCOUNTER — Encounter: Payer: Self-pay | Admitting: Obstetrics

## 2012-07-05 ENCOUNTER — Ambulatory Visit: Payer: Commercial Managed Care - PPO | Admitting: Obstetrics

## 2012-07-23 ENCOUNTER — Other Ambulatory Visit: Payer: Self-pay | Admitting: *Deleted

## 2012-07-23 DIAGNOSIS — Z3480 Encounter for supervision of other normal pregnancy, unspecified trimester: Secondary | ICD-10-CM

## 2012-07-23 MED ORDER — OB COMPLETE PETITE 35-5-1-200 MG PO CAPS: 1.0000 | ORAL_CAPSULE | Freq: Every day | ORAL | Status: DC

## 2012-07-23 NOTE — Telephone Encounter (Signed)
Patient requested refill of PNV

## 2012-12-10 ENCOUNTER — Ambulatory Visit: Payer: 59 | Admitting: Obstetrics

## 2013-01-08 IMAGING — US US OB FOLLOW-UP
1 of 2 series · 12 of 28 positions shown · non-contrast
Comparison: none

[Series 1: us ob follow up · 12 of 55 slices shown]
[im 3/55]
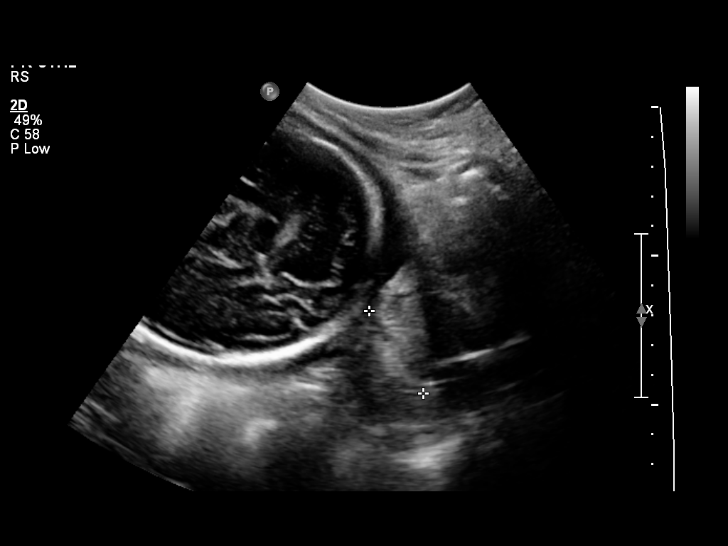
[im 7/55]
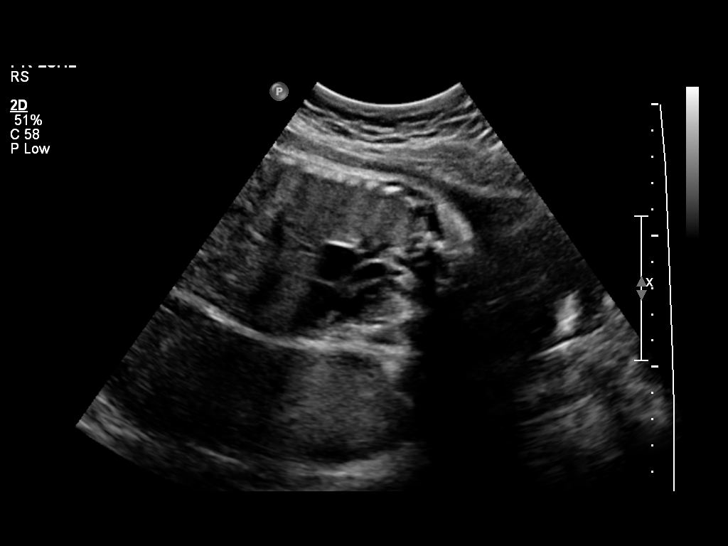
[im 11/55]
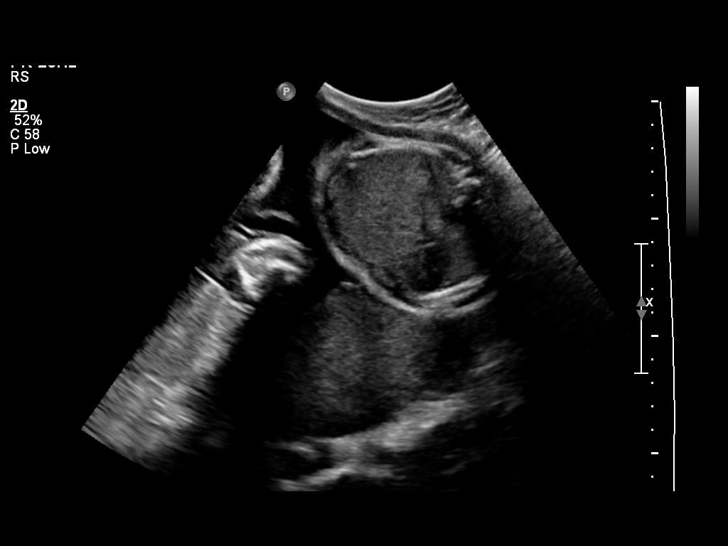
[im 17/55]
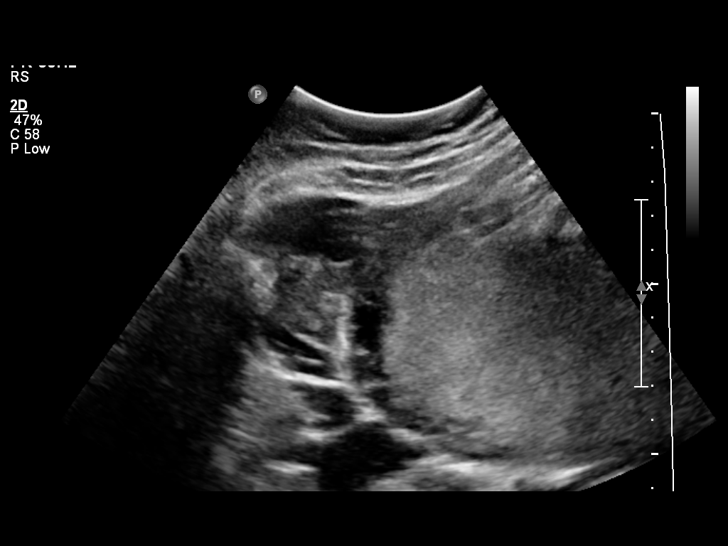
[im 21/55]
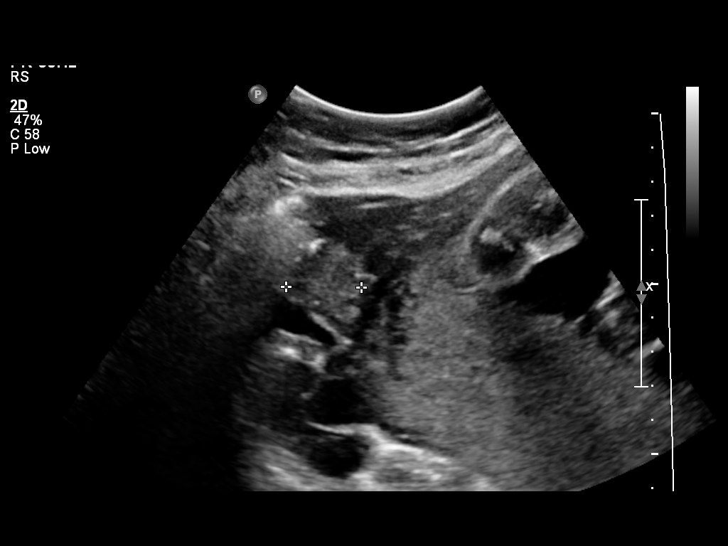
[im 25/55]
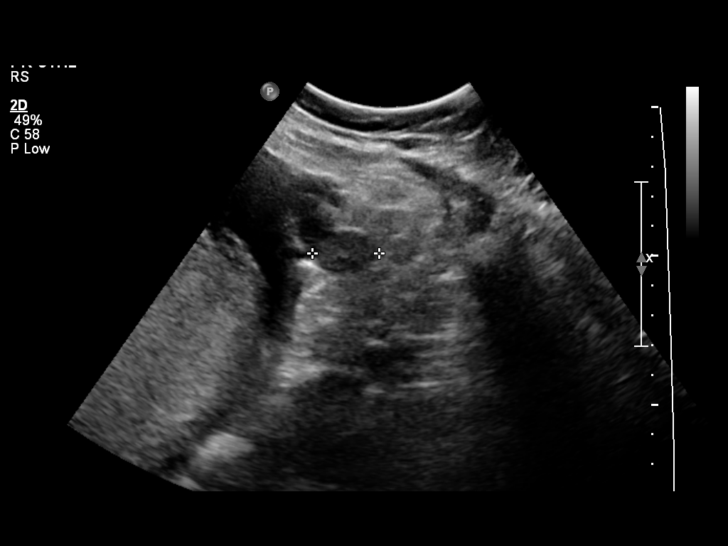
[im 32/55]
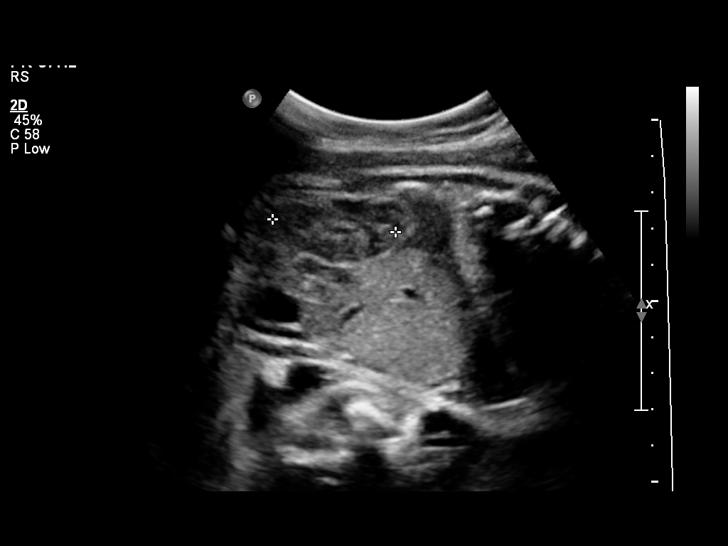
[im 36/55]
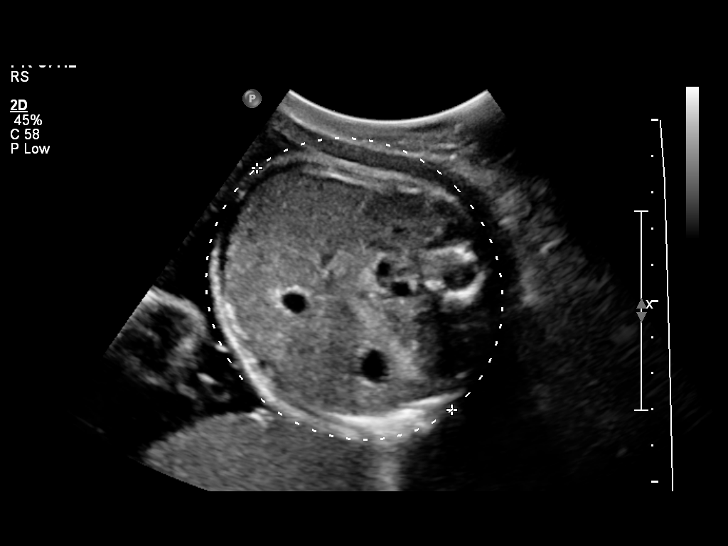
[im 40/55]
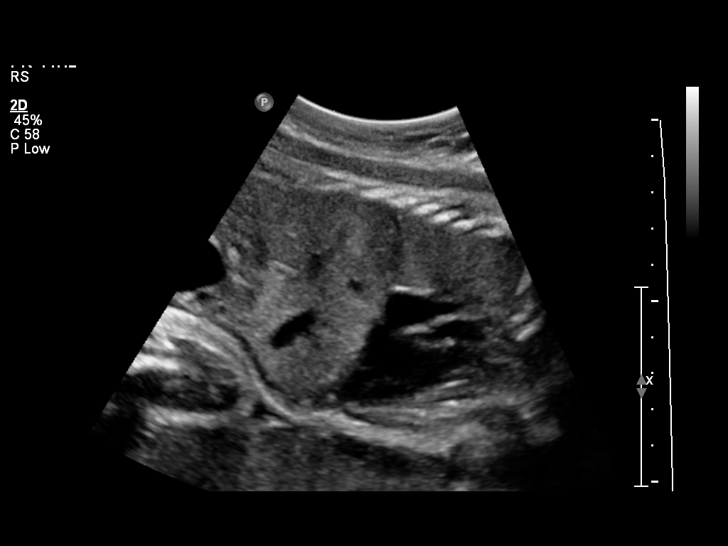
[im 46/55]
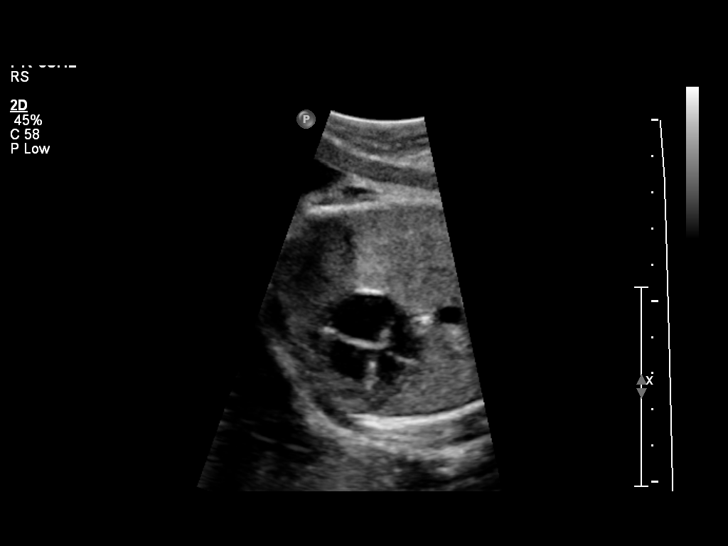
[im 50/55]
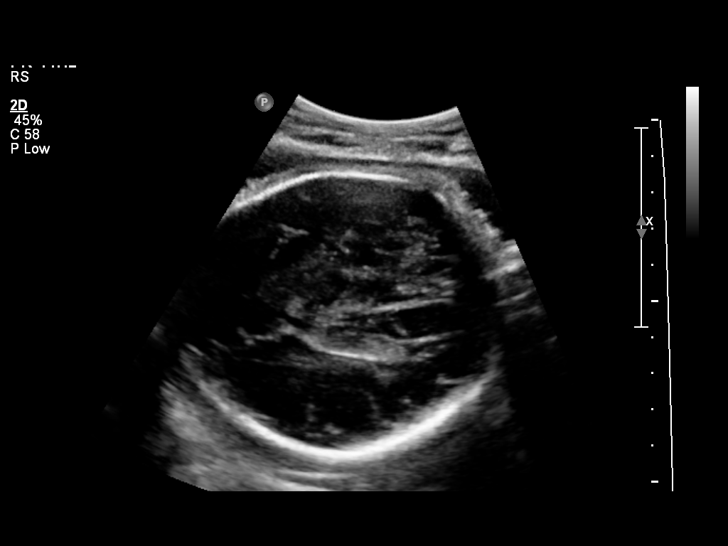
[im 55/55]
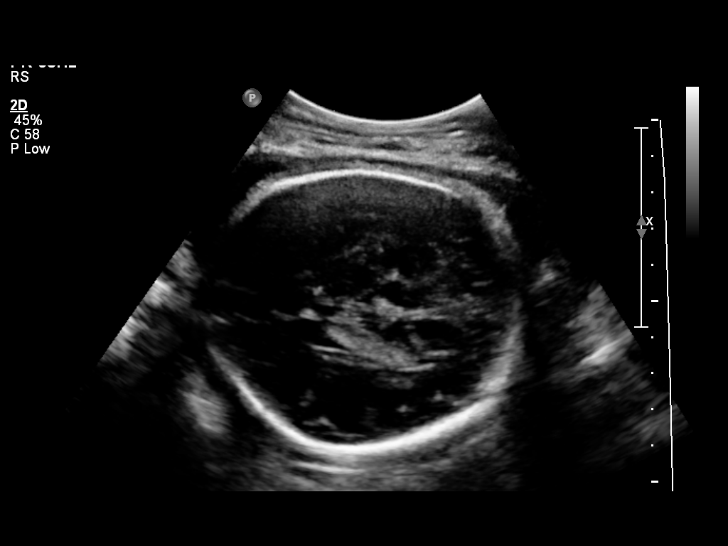

[12 of 28 positions shown; findings below may reference images not displayed]

OBSTETRICS REPORT
                      (Signed Final 02/06/2012 [DATE])

Service(s) Provided

 US OB FOLLOW UP                                       76816.1
Indications

 Size greater than dates (Large for gestational [AGE]
 Echogenic intracardiac focus of the heart  (EIF)
 Poor obstetric history: Previous preeclampsia
Fetal Evaluation

 Num Of Fetuses:    1
 Fetal Heart Rate:  132                         bpm
 Cardiac Activity:  Observed
 Presentation:      Cephalic
 Placenta:          Posterior, above cervical
                    os
 P. Cord            Previously Visualized
 Insertion:

 Amniotic Fluid
 AFI FV:      Subjectively within normal limits
 AFI Sum:     16.74   cm      61   %Tile     Larg Pckt:   4.94   cm
 RUQ:   3.27   cm    RLQ:    4.21   cm    LUQ:   4.32    cm   LLQ:    4.94   cm
Biometry

 BPD:     72.6  mm    G. Age:   29w 1d                CI:         68.7   70 - 86
                                                      FL/HC:      20.8   19.2 -

 HC:     279.9  mm    G. Age:   30w 5d       29  %    HC/AC:      1.08   0.99 -

 AC:     259.9  mm    G. Age:   30w 1d       45  %    FL/BPD:     80.0   71 - 87
 FL:      58.1  mm    G. Age:   30w 2d       42  %    FL/AC:      22.4   20 - 24
 HUM:     51.5  mm    G. Age:   30w 1d       52  %

 Est. FW:    3473  gm      3 lb 6 oz     54  %
Gestational Age

 LMP:           30w 1d       Date:   07/10/11                 EDD:   04/15/12
 U/S Today:     30w 0d                                        EDD:   04/16/12
 Best:          30w 1d    Det. By:   LMP  (07/10/11)          EDD:   04/15/12
Anatomy
 Cranium:          Appears normal         Aortic Arch:      Previously seen
 Fetal Cavum:      Previously seen        Ductal Arch:      Not well visualized
 Ventricles:       Appears normal         Diaphragm:        Appears normal
 Choroid Plexus:   Previously seen        Stomach:          Appears normal
 Cerebellum:       Previously seen        Abdomen:          Appears normal
 Posterior Fossa:  Previously seen        Abdominal Wall:   Previously seen
 Nuchal Fold:      Previously seen        Cord Vessels:     Previously seen
 Face:             Orbits appear          Kidneys:          Appear normal
                   normal
 Lips:             Previously seen        Bladder:          Appears normal
 Heart:            Appears normal         Spine:            Previously seen
                   (4CH, axis, and
                   situs)
 RVOT:             Previously seen        Lower             Previously seen
                                          Extremities:
 LVOT:             Previously seen        Upper             Previously seen
                                          Extremities:

 Other:  Heels and 5th digit previously visualized. Parents having gender
         reveal party on [REDACTED]. Fetus appears to be a female. patient
         given images in folder.
Targeted Anatomy

 Fetal Central Nervous System
 Lat. Ventricles:
Cervix Uterus Adnexa

 Cervical Length:   3.3       cm

 Cervix:       Normal appearance by transabdominal scan. Closed.
 Left Ovary:   Size(cm) L: 3.05 x W: 2.24 x H: 1.65  Volume(cc):
 Right Ovary:  Size(cm) L: 2.13 x W: 2.2 x H: 1.71  Volume(cc):
 Adnexa:     No abnormality visualized.
Impression

 Single live IUP in cephalic presentation.  Concordant
 measurements/assigned GA by LMP.
 No late-developing anomaly in visualized structures above.

## 2013-04-29 ENCOUNTER — Encounter: Payer: Self-pay | Admitting: Dietician

## 2013-04-29 ENCOUNTER — Encounter: Payer: Commercial Managed Care - PPO | Attending: Obstetrics and Gynecology | Admitting: Dietician

## 2013-04-29 VITALS — Ht 63.5 in | Wt 219.7 lb

## 2013-04-29 DIAGNOSIS — Z713 Dietary counseling and surveillance: Secondary | ICD-10-CM | POA: Insufficient documentation

## 2013-04-29 DIAGNOSIS — E669 Obesity, unspecified: Secondary | ICD-10-CM | POA: Insufficient documentation

## 2013-04-29 NOTE — Patient Instructions (Addendum)
-  Eat consistently; try not to skip meals -Drink more water -Mindful eating: eat slowly, enjoy your food and your family! -Exercise: walking around the neighborhood with the family, taking walks at work -Keep some healthy snacks handy: grapes, raw veggies with guacamole or hummus, light string cheese, yogurt -At restaurants: ask for the dressing on the side or ask for 1/2 dressing with salads; look up nutrition info before; get half your meal to go -Find your balance with carbohydrates   Motivation: -Energy for kids -Feeling comfortable in skin -Having fun shopping and trying on clothes -Diabetes prevention

## 2013-04-29 NOTE — Progress Notes (Signed)
  Medical Nutrition Therapy:  Appt start time: 1030 end time:  1130.   Assessment:  Primary concerns today: Michelle Pacheco is here today with her mom. She states she wants to lose weight, as she has had difficulty losing weight from her second pregnancy 1 year ago. She reports that she wants to change her lifestyle and wishes to discuss realistic goals. Michelle Pacheco lives with her fiance, 30-year old son, and 30-year old daughter. Michelle Pacheco is not sure if her fiance will be supportive of healthy lifestyle changes, although he has also recently gained some weight. 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 She reports that she rarely brings her own food to work. Drug reps often bring in junk food and hearty meals. She and her coworkers often go out to lunch where she will have pasta or salads with ranch or caesar dressing. Michelle Pacheco has recently started back to school to earn her [employer]. She reports that she will occasionally have regular Pepsi or Coke in the evenings while studying.    Preferred Learning Style:  No preference indicated   Learning Readiness:   Contemplating   MEDICATIONS: see list   DIETARY INTAKE:  Avoided foods include citrus and peanuts.    24-hr recall:  B ( AM): Microwavable Jimmy Dean Malaw[employer]urkey sausage breakfast sandwich and coffee  Snk ( AM): Nutrigrain bar L ( PM): Leftovers from the night before; Pharm rep food; [employer] salads with bread Snk ( PM): sometimes: fruit or granola D ( PM): baked chicken and vegetables and macaroni and bread Snk ( PM): none  Beverages: water, occasionally regular soda  Usual physical activity: Walking around the parking lot or neighborhood when she can  Estimated energy needs: 1500-2000 calories   Progress Towards Goal(s):  No progress.   Nutritional Diagnosis:  [place]-3.3 Overweight/obesity As related to excessive energy intake and physical inactivity.  As evidenced by BMI 38.    Intervention:  Nutrition counseling  provided. -Eat consistently; try not to skip meals -Drink more water -Mindful eating: eat slowly, enjoy your food and your family! -Exercise: walking around the neighborhood with the family, taking walks at work -Keep some healthy snacks handy: grapes, raw veggies with guacamole or hummus, light string cheese, yogurt -At restaurants: ask for the dressing on the side or ask for 1/2 dressing with salads; look up nutrition info before; get half your meal to go -Find your balance with carbohydrates (stick to 1-2 servings per meal)  Motivation: -Energy for kids -Feeling comfortable in skin -Having fun shopping and trying on clothes -Diabetes prevention  Teaching Method Utilized:  Visual Auditory Hands on  Handouts given during visit include:  15g CHO+protein snacks  MyPlate  Barriers to learning/adherence to lifestyle change: food preferences  Demonstrated degree of understanding via:  Teach Back   Monitoring/Evaluation:  Dietary intake, exercise, and body weight in 3 month(s).

## 2013-05-30 DIAGNOSIS — Z9109 Other allergy status, other than to drugs and biological substances: Secondary | ICD-10-CM | POA: Insufficient documentation

## 2013-07-29 ENCOUNTER — Ambulatory Visit: Payer: Commercial Managed Care - PPO | Admitting: Dietician

## 2013-09-09 ENCOUNTER — Ambulatory Visit: Payer: Commercial Managed Care - PPO | Admitting: Dietician

## 2013-11-21 DIAGNOSIS — Z833 Family history of diabetes mellitus: Secondary | ICD-10-CM | POA: Insufficient documentation

## 2013-11-21 DIAGNOSIS — E282 Polycystic ovarian syndrome: Secondary | ICD-10-CM | POA: Insufficient documentation

## 2013-12-16 ENCOUNTER — Encounter: Payer: Self-pay | Admitting: Dietician

## 2014-11-06 ENCOUNTER — Encounter (HOSPITAL_COMMUNITY): Payer: Self-pay | Admitting: Emergency Medicine

## 2014-11-06 ENCOUNTER — Emergency Department (HOSPITAL_COMMUNITY)
Admission: EM | Admit: 2014-11-06 | Discharge: 2014-11-06 | Discharge status: Against Medical Advice | Payer: Commercial Managed Care - PPO | Attending: Emergency Medicine | Admitting: Emergency Medicine

## 2014-11-06 DIAGNOSIS — R109 Unspecified abdominal pain: Secondary | ICD-10-CM | POA: Insufficient documentation

## 2014-11-06 LAB — COMPREHENSIVE METABOLIC PANEL
ALBUMIN: 3.8 g/dL (ref 3.5–5.0)
ALK PHOS: 45 U/L (ref 38–126)
ALT: 17 U/L (ref 14–54)
AST: 21 U/L (ref 15–41)
Anion gap: 8 (ref 5–15)
BUN: 10 mg/dL (ref 6–20)
CALCIUM: 9.1 mg/dL (ref 8.9–10.3)
CHLORIDE: 106 mmol/L (ref 101–111)
CO2: 25 mmol/L (ref 22–32)
CREATININE: 0.84 mg/dL (ref 0.44–1.00)
GFR calc Af Amer: >60 mL/min (ref >60)
GFR calc non Af Amer: >60 mL/min (ref >60)
GLUCOSE: 121 mg/dL — AB (ref 65–99)
Potassium: 3.9 mmol/L (ref 3.5–5.1)
SODIUM: 139 mmol/L (ref 135–145)
Total Bilirubin: 0.1 mg/dL — ABNORMAL LOW (ref 0.3–1.2)
Total Protein: 7.3 g/dL (ref 6.5–8.1)

## 2014-11-06 LAB — CBC
HCT: 35.8 % — ABNORMAL LOW (ref 36.0–46.0)
HEMOGLOBIN: 11.6 g/dL — AB (ref 12.0–15.0)
MCH: 27.5 pg (ref 26.0–34.0)
MCHC: 32.4 g/dL (ref 30.0–36.0)
MCV: 84.8 fL (ref 78.0–100.0)
PLATELETS: 256 10*3/uL (ref 150–400)
RBC: 4.22 MIL/uL (ref 3.87–5.11)
RDW: 14.6 % (ref 11.5–15.5)
WBC: 16.1 10*3/uL — ABNORMAL HIGH (ref 4.0–10.5)

## 2014-11-06 NOTE — ED Notes (Signed)
Pt denies N/V, c/o diarrhea. Tolerating PO fluids.

## 2014-11-06 NOTE — ED Notes (Signed)
Per pt, states she is being treated for strep throat-states she started having abdominal pain yesterday-called [facility] and they told her to come here

## 2014-11-06 NOTE — ED Notes (Signed)
pt LWBS. Stated she had to take children to their doctors appointment.

## 2014-11-06 NOTE — ED Notes (Signed)
Pt states she is unable to wait to be seen and needs to take children to doctor. Pt left without being seen by ED Provider.

## 2015-12-29 DIAGNOSIS — Z01419 Encounter for gynecological examination (general) (routine) without abnormal findings: Secondary | ICD-10-CM | POA: Diagnosis not present

## 2015-12-29 DIAGNOSIS — Z3041 Encounter for surveillance of contraceptive pills: Secondary | ICD-10-CM | POA: Diagnosis not present

## 2016-03-25 ENCOUNTER — Encounter: Payer: Self-pay | Admitting: Gastroenterology

## 2016-03-28 ENCOUNTER — Encounter (INDEPENDENT_AMBULATORY_CARE_PROVIDER_SITE_OTHER): Payer: Self-pay

## 2016-03-28 ENCOUNTER — Ambulatory Visit (INDEPENDENT_AMBULATORY_CARE_PROVIDER_SITE_OTHER): Payer: 59 | Admitting: Gastroenterology

## 2016-03-28 ENCOUNTER — Encounter: Payer: Self-pay | Admitting: Gastroenterology

## 2016-03-28 VITALS — BP 110/76 | HR 76 | Ht 63.25 in | Wt 196.0 lb

## 2016-03-28 DIAGNOSIS — K6289 Other specified diseases of anus and rectum: Secondary | ICD-10-CM

## 2016-03-28 DIAGNOSIS — K641 Second degree hemorrhoids: Secondary | ICD-10-CM

## 2016-03-28 NOTE — Progress Notes (Signed)
[facility] Consult Note:  History: Michelle Pacheco 03/28/2016  Referring physician: Vista Deck, NP  Reason for consult/chief complaint: Hemorrhoids (for 9 days); Rectal Pain (itching and discomfort); and change in bowel habits (diarrhea alternating with constipation, ? if from Sushi)   Subjective  HPI:  This is a 31 year old woman referred by primary care for recent onset rectal discomfort and suspected hemorrhoids. She was well until about 10 days ago, when she had the acute onset of diarrhea that she believes may have occurred after eating sushi. She then had prolapse of what she believed to be hemorrhoid tissue with pain and burning and itching. She was trying various creams and late last week sitz baths. Over this past weekend things seem to have subsided quite a lot. She still having some intermittent left lower quadrant pain that is nonradiating and her bowel habits are normalizing. She denies rectal bleeding.   ROS:  Review of Systems  Constitutional: Negative for appetite change and unexpected weight change.  HENT: Negative for mouth sores and voice change.   Eyes: Negative for pain and redness.  Respiratory: Negative for cough and shortness of breath.   Cardiovascular: Negative for chest pain and palpitations.  Genitourinary: Negative for dysuria and hematuria.  Musculoskeletal: Negative for arthralgias and myalgias.  Skin: Negative for pallor and rash.  Neurological: Negative for weakness and headaches.  Hematological: Negative for adenopathy.     Past Medical History: Past Medical History:  Diagnosis Date  . Anemia   . Anxiety   . Blood transfusion without reported diagnosis   . Medical history non-contributory      Past Surgical History: Past Surgical History:  Procedure Laterality Date  . DILATION AND CURETTAGE OF UTERUS    . WISDOM TOOTH EXTRACTION       Family History: Family History  Problem Relation Age of Onset  .  Multiple sclerosis Mother   . Diabetes Father   . Asthma Brother   . Diabetes Maternal Grandfather   . Hypertension Maternal Grandfather   . Prostate cancer Maternal Grandfather     Social History: Social History   Social History  . Marital status: Single    Spouse name: N/A  . Number of children: 2  . Years of education: N/A   Occupational History  . REGISTRATION [employer]   Social History Main Topics  . Smoking status: Former Smoker    Types: Cigarettes    Quit date: 09/07/2011  . Smokeless tobacco: Never Used  . Alcohol use Yes     Comment: rarely  . Drug use: No  . Sexual activity: Yes    Partners: Male   Other Topics Concern  . None   Social History Narrative  . None    Allergies: Allergies  Allergen Reactions  . Peanuts [Peanut Oil] Swelling  . Citrus Hives    Outpatient Meds: Current Outpatient Prescriptions  Medication Sig Dispense Refill  . cetirizine (ZYRTEC) 10 MG tablet Take 10 mg by mouth daily.    . SPRINTEC 28 0.25-35 MG-MCG tablet Take 1 tablet by mouth daily.    Marland Kitchen EPINEPHrine 0.3 mg/0.3 mL IJ SOAJ injection Use As Directed In Need of Emergency.     No current facility-administered medications for this visit.       ___________________________________________________________________ Objective   Exam:  BP 110/76 (BP Location: Left Arm, Patient Position: Sitting, Cuff Size: Normal)   Pulse 76   Ht 5' 3.25" (1.607 m) Comment: height measured without shoes  Wt 196  lb (88.9 kg)   LMP 03/03/2016   BMI 34.45 kg/m    General: this is a(n) Well-appearing woman   Eyes: sclera anicteric, no redness  ENT: oral mucosa moist without lesions, no cervical or supraclavicular lymphadenopathy, good dentition  CV: RRR without murmur, S1/S2, no JVD, no peripheral edema  Resp: clear to auscultation bilaterally, normal RR and effort noted  GI: soft, mild LLQ tenderness, with active bowel sounds. No guarding or palpable  organomegaly noted.  Skin; warm and dry, no rash or jaundice noted  Neuro: awake, alert and oriented x 3. Normal gross motor function and fluent speech Rectal: Normal external exam, no anal fissure, no tenderness or palpable internal lesions. Anoscopy reveals noninflamed internal hemorrhoids.  Exam chaperoned by our MA Sheralyn Boatman   Assessment: Encounter Diagnoses  Name Primary?  . Grade II internal hemorrhoids Yes  . Rectal or anal pain     She seems to have developed prolapsed internal hemorrhoids after an episode of diarrhea, all of which has improved at this point.  Plan:  She will continue to use witch hazel toilet wipes She does not appear to need hemorrhoid suppositories at this point She was instructed to call us in 2 weeks with an update in her condition if not improved, colonoscopy is most likely next step.   Thank you for the courtesy of this consult.  Please call me with any questions or concerns.  Charlie Pitter III  CC: Vista Deck, NP

## 2016-03-28 NOTE — Patient Instructions (Signed)
Follow up as needed.   If you are age 33 or older, your body mass index should be between 23-30. Your Body mass index is 34.45 kg/m. If this is out of the aforementioned range listed, please consider follow up with your Primary Care Provider.  If you are age 25 or younger, your body mass index should be between 19-25. Your Body mass index is 34.45 kg/m. If this is out of the aformentioned range listed, please consider follow up with your Primary Care Provider.   Thank you for choosing [facility]  Dr Amada Jupiter III

## 2016-03-30 ENCOUNTER — Ambulatory Visit: Payer: Commercial Managed Care - PPO | Admitting: Gastroenterology

## 2016-05-03 ENCOUNTER — Ambulatory Visit: Payer: Commercial Managed Care - PPO | Admitting: Gastroenterology

## 2016-09-09 DIAGNOSIS — K5900 Constipation, unspecified: Secondary | ICD-10-CM | POA: Diagnosis not present

## 2016-09-09 DIAGNOSIS — K649 Unspecified hemorrhoids: Secondary | ICD-10-CM | POA: Diagnosis not present

## 2016-09-09 DIAGNOSIS — M6208 Separation of muscle (nontraumatic), other site: Secondary | ICD-10-CM | POA: Diagnosis not present

## 2016-09-09 DIAGNOSIS — K429 Umbilical hernia without obstruction or gangrene: Secondary | ICD-10-CM | POA: Diagnosis not present

## 2016-09-23 ENCOUNTER — Emergency Department (HOSPITAL_BASED_OUTPATIENT_CLINIC_OR_DEPARTMENT_OTHER)
Admission: EM | Admit: 2016-09-23 | Discharge: 2016-09-23 | Disposition: A | Discharge status: Home / Self Care | Payer: 59 | Attending: Emergency Medicine | Admitting: Emergency Medicine

## 2016-09-23 ENCOUNTER — Emergency Department (HOSPITAL_BASED_OUTPATIENT_CLINIC_OR_DEPARTMENT_OTHER): Payer: 59

## 2016-09-23 ENCOUNTER — Encounter (HOSPITAL_BASED_OUTPATIENT_CLINIC_OR_DEPARTMENT_OTHER): Payer: Self-pay | Admitting: Emergency Medicine

## 2016-09-23 DIAGNOSIS — S93602A Unspecified sprain of left foot, initial encounter: Secondary | ICD-10-CM | POA: Insufficient documentation

## 2016-09-23 DIAGNOSIS — Y92019 Unspecified place in single-family (private) house as the place of occurrence of the external cause: Secondary | ICD-10-CM | POA: Insufficient documentation

## 2016-09-23 DIAGNOSIS — Z87891 Personal history of nicotine dependence: Secondary | ICD-10-CM | POA: Insufficient documentation

## 2016-09-23 DIAGNOSIS — Z79899 Other long term (current) drug therapy: Secondary | ICD-10-CM | POA: Insufficient documentation

## 2016-09-23 DIAGNOSIS — M25572 Pain in left ankle and joints of left foot: Secondary | ICD-10-CM | POA: Diagnosis not present

## 2016-09-23 DIAGNOSIS — Y9301 Activity, walking, marching and hiking: Secondary | ICD-10-CM | POA: Insufficient documentation

## 2016-09-23 DIAGNOSIS — Z91010 Allergy to peanuts: Secondary | ICD-10-CM | POA: Insufficient documentation

## 2016-09-23 DIAGNOSIS — Y999 Unspecified external cause status: Secondary | ICD-10-CM | POA: Diagnosis not present

## 2016-09-23 DIAGNOSIS — M79672 Pain in left foot: Secondary | ICD-10-CM | POA: Diagnosis not present

## 2016-09-23 DIAGNOSIS — W109XXA Fall (on) (from) unspecified stairs and steps, initial encounter: Secondary | ICD-10-CM | POA: Insufficient documentation

## 2016-09-23 DIAGNOSIS — S99912A Unspecified injury of left ankle, initial encounter: Secondary | ICD-10-CM | POA: Diagnosis not present

## 2016-09-23 DIAGNOSIS — S99922A Unspecified injury of left foot, initial encounter: Secondary | ICD-10-CM | POA: Diagnosis not present

## 2016-09-23 MED ORDER — IBUPROFEN 600 MG PO TABS: 600.0000 mg | ORAL_TABLET | Freq: Four times a day (QID) | ORAL | 0 refills | Status: DC | PRN

## 2016-09-23 MED ORDER — HYDROCODONE-ACETAMINOPHEN 5-325 MG PO TABS
1.0000 | ORAL_TABLET | Freq: Once | ORAL | Status: AC
  Administered 2016-09-23: 1 via ORAL
  Filled 2016-09-23: qty 1

## 2016-09-23 MED ORDER — IBUPROFEN 400 MG PO TABS
600.0000 mg | ORAL_TABLET | Freq: Once | ORAL | Status: AC
  Administered 2016-09-23: 600 mg via ORAL
  Filled 2016-09-23: qty 1

## 2016-09-23 MED ORDER — HYDROCODONE-ACETAMINOPHEN 5-325 MG PO TABS: 1.0000 | ORAL_TABLET | ORAL | 0 refills | Status: DC | PRN

## 2016-09-23 NOTE — ED Provider Notes (Signed)
[facility] Provider Note   CSN: [id number] Arrival date & time: 09/23/16  1905     History   Chief Complaint No chief complaint on file.   HPI Michelle Pacheco is a 31 y.o. female.  Pt presents to the ED today with left foot pain.  Pt just moved into a new home and is not used to the steps.  She fell and twisted her left foot.  She said it hurts to walk on it.      Past Medical History:  Diagnosis Date  . Anemia   . Anxiety   . Blood transfusion without reported diagnosis   . Medical history non-contributory     There are no active problems to display for this patient.   Past Surgical History:  Procedure Laterality Date  . DILATION AND CURETTAGE OF UTERUS    . WISDOM TOOTH EXTRACTION      OB History    Gravida Para Term Preterm AB Living   4 2 2  0 2 2   SAB TAB Ectopic Multiple Live Births           2       Home Medications    Prior to Admission medications   Medication Sig Start Date End Date Taking? Authorizing Provider  cetirizine (ZYRTEC) 10 MG tablet Take 10 mg by mouth daily.    [provider]  EPINEPHrine 0.3 mg/0.3 mL IJ SOAJ injection Use As Directed In Need of Emergency. 08/07/15   [provider]  HYDROcodone-acetaminophen (NORCO/VICODIN) 5-325 MG tablet Take 1 tablet by mouth every 4 (four) hours as needed. 09/23/16   Jacalyn Lefevre, MD  ibuprofen (ADVIL,MOTRIN) 600 MG tablet Take 1 tablet (600 mg total) by mouth every 6 (six) hours as needed. 09/23/16   Jacalyn Lefevre, MD  SPRINTEC 28 0.25-35 MG-MCG tablet Take 1 tablet by mouth daily. 03/11/16   [provider]    Family History Family History  Problem Relation Age of Onset  . Multiple sclerosis Mother   . Diabetes Father   . Asthma Brother   . Diabetes Maternal Grandfather   . Hypertension Maternal Grandfather   . Prostate cancer Maternal Grandfather     Social History Social History  Substance Use Topics  . Smoking status: Former Smoker      Types: Cigarettes    Quit date: 09/07/2011  . Smokeless tobacco: Never Used  . Alcohol use Yes     Comment: rarely     Allergies   Peanuts [peanut oil] and Citrus   Review of Systems Review of Systems  Musculoskeletal:       Left foot pain  All other systems reviewed and are negative.    Physical Exam Updated Vital Signs Pulse (!) 101   Temp 98.4 F (36.9 C) (Oral)   Resp 18   Ht 5\' 3"  (1.6 m)   Wt 94.3 kg (208 lb)   LMP 08/31/2016 (Within Days)   SpO2 100%   BMI 36.85 kg/m   Physical Exam  Constitutional: She is oriented to person, place, and time. She appears well-developed and well-nourished.  HENT:  Head: Normocephalic and atraumatic.  Right Ear: External ear normal.  Left Ear: External ear normal.  Nose: Nose normal.  Mouth/Throat: Oropharynx is clear and moist.  Eyes: Pupils are equal, round, and reactive to light. EOM are normal.  Neck: Normal range of motion. Neck supple.  Cardiovascular: Normal rate, regular rhythm, normal heart sounds and intact distal pulses.   Pulmonary/Chest:  Effort normal and breath sounds normal.  Abdominal: Soft. Bowel sounds are normal.  Musculoskeletal:       Feet:  Neurological: She is alert and oriented to person, place, and time.  Skin: Skin is warm.  Psychiatric: She has a normal mood and affect. Her behavior is normal. Judgment and thought content normal.  Nursing note and vitals reviewed.    ED Treatments / Results  Labs (all labs ordered are listed, but only abnormal results are displayed) Labs Reviewed - No data to display  EKG  EKG Interpretation None       Radiology Dg Ankle Complete Left  Result Date: 09/23/2016 CLINICAL DATA:  Lateral left foot and ankle pain following a fall today. EXAM: LEFT ANKLE COMPLETE - 3+ VIEW COMPARISON:  Left foot radiographs obtained at the same time. FINDINGS: Small inferior calcaneal spur. No fracture, dislocation or effusion. IMPRESSION: No fracture. Electronically  Signed   By: Beckie Salts M.D.   On: 09/23/2016 20:15   Dg Foot Complete Left  Result Date: 09/23/2016 CLINICAL DATA:  Lateral left foot and ankle pain and swelling following a fall today. EXAM: LEFT FOOT - COMPLETE 3+ VIEW COMPARISON:  Left ankle radiographs obtained at the same time. FINDINGS: Small inferior calcaneal spur.  No fracture or dislocation. IMPRESSION: No fracture. Electronically Signed   By: Beckie Salts M.D.   On: 09/23/2016 20:15    Procedures Procedures (including critical care time)  Medications Ordered in ED Medications  ibuprofen (ADVIL,MOTRIN) tablet 600 mg (not administered)  HYDROcodone-acetaminophen (NORCO/VICODIN) 5-325 MG per tablet 1 tablet (not administered)     Initial Impression / Assessment and Plan / ED Course  I have reviewed the triage vital signs and the nursing notes.  Pertinent labs & imaging results that were available during my care of the patient were reviewed by me and considered in my medical decision making (see chart for details).     Pt given post op shoe and crutches.  She is given the number of ortho to f/u.  She knows to return if worse.  Final Clinical Impressions(s) / ED Diagnoses   Final diagnoses:  Foot sprain, left, initial encounter    New Prescriptions New Prescriptions   HYDROCODONE-ACETAMINOPHEN (NORCO/VICODIN) 5-325 MG TABLET    Take 1 tablet by mouth every 4 (four) hours as needed.   IBUPROFEN (ADVIL,MOTRIN) 600 MG TABLET    Take 1 tablet (600 mg total) by mouth every 6 (six) hours as needed.     Jacalyn Lefevre, MD 09/23/16 2124

## 2016-09-23 NOTE — ED Triage Notes (Signed)
Patient states that she was moving furniture and fell down stairs and hurt her left foot

## 2016-09-23 NOTE — ED Notes (Signed)
Pt teaching provided on medications that may cause drowsiness. Pt instructed not to drive or operate heavy machinery while taking the prescribed medication. Pt verbalized understanding.   

## 2016-11-17 DIAGNOSIS — J301 Allergic rhinitis due to pollen: Secondary | ICD-10-CM | POA: Diagnosis not present

## 2016-11-17 DIAGNOSIS — J3081 Allergic rhinitis due to animal (cat) (dog) hair and dander: Secondary | ICD-10-CM | POA: Diagnosis not present

## 2016-11-17 DIAGNOSIS — J3089 Other allergic rhinitis: Secondary | ICD-10-CM | POA: Diagnosis not present

## 2016-11-17 DIAGNOSIS — R21 Rash and other nonspecific skin eruption: Secondary | ICD-10-CM | POA: Diagnosis not present

## 2017-02-14 NOTE — L&D Delivery Note (Signed)
Delivery Note  First Stage: Labor onset: 12/25/17 @ 2326 Augmentation : Pitocin and AROM Analgesia /Anesthesia intrapartum: none SROM noted at 0120, then AROM forebag at 0641AM for clear fluid  Second Stage: Complete dilation at 0650 Involuntary pushing at 0645AM FHR second stage Cat 2 - baseline 120bpm, occasional variable decels to 90 bpm with pushing and good return to baseline. She pushed for 13 minutes over an intact perineum.   Delivery of a viable, healthy female "Michelle Pacheco at [phone] by Carlean Jews, CNM in compound presentation LOA with right hand Loose nuchal cord x1 reduced over head prior to delivery of the body  Cord double clamped after cessation of pulsation, cut by FOB Cord blood sample collected   Third Stage: Retained placenta for 61 minutes. Initially, I performed active management with gentle uterine massage and gentle cord traction applied.  I discussed with Dr. Cherly Hensen and orders received to start Pitocin IV bolus given after 20 minutes. Placenta was still intact and adherent to the uterus and no bleeding was noted. Dr. Amado Nash in route to assess for placenta removal.  At 7:56am, partial separation was noted at the cord insertion, but unable to feel clear margins. The bleeding started to become brisk, and I asked [facility] attending MD, Dr. Alysia Penna to attend while Dr. Amado Nash was in route to assess. Anesthesia was also notified at the time to prepare for Nitroglycerin, however, it was removed before they arrived. Dr. Alysia Penna provided fundal massage and cord traction and the placenta delivered via Tomasa Blase intact with 3 VC @ 0804. rectal Cytotec given due to postpartum hemorrhage.  Blood loss at the time was and 2nd stage postpartum hemorrhage protocol was initiated. Uterus was firm at the umbilicus and bleeding was minimal. CBC was drawn after initial blood loss and Hgb was 11.4/ Hct 33.6. Vital signs were stable. At 9:00-9:06am, I was notified by  secretary to come to 166 because patient "passed out" and they were calling a Code Hemorrhage.  They notified me that Dr. Adrian Blackwater with [facility] was in the room managing.  See his note for details.  Dr. Amado Nash also notified to attend bedside. I arrived shortly after call, and pt. Was receiving IVF bolus, TXA, and starting 1 unit of PRBCs.  She was s/p Methergine 0.25mg  x1. Updated by Dr. Adrian Blackwater. Pt. Also Receiving phenylephrine PRN and improving BPs. Pt. Alert when I walked into the room.  Placenta disposition: hospital disposal   No laceration identified  Anesthesia for repair: Fentanyl given IVP for placental expulsion  Total Est. Blood Loss (mL):  Complications: postpartum hemorrhage   Mom to postpartum.  Baby to Couplet care / Skin to Skin.  Newborn: Birth Weight: 6#13.7oz Apgar Scores: 8, 9 Feeding planned: Breast  Carlean Jews, MSN, CNM [facility]

## 2017-02-20 ENCOUNTER — Ambulatory Visit (INDEPENDENT_AMBULATORY_CARE_PROVIDER_SITE_OTHER): Payer: 59 | Admitting: Family Medicine

## 2017-02-20 ENCOUNTER — Encounter: Payer: Self-pay | Admitting: Family Medicine

## 2017-02-20 ENCOUNTER — Other Ambulatory Visit: Payer: Self-pay

## 2017-02-20 VITALS — BP 129/86 | HR 102 | Temp 99.5°F | Resp 17 | Ht 65.0 in | Wt 223.0 lb

## 2017-02-20 DIAGNOSIS — M62838 Other muscle spasm: Secondary | ICD-10-CM

## 2017-02-20 MED ORDER — CYCLOBENZAPRINE HCL 5 MG PO TABS: 5.0000 mg | ORAL_TABLET | Freq: Three times a day (TID) | ORAL | 1 refills | Status: DC | PRN

## 2017-02-20 NOTE — Progress Notes (Signed)
Chief Complaint  Patient presents with  . New Patient (Initial Visit)    right shoulder pain x 1 week, pain radiating into neck and down arm, pain level 7/10, taking ibuprofen and tylenol, pain is sharp and throbbing.  Pain is in the shoulder blade.    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 She has pain with external rotation and reaching behind.  She reports that she was sick with a cough the week before Christmas.    Past Medical History:  Diagnosis Date  . Anemia   . Anxiety   . Blood transfusion without reported diagnosis   . Medical history non-contributory     Current Outpatient Medications  Medication Sig Dispense Refill  . cetirizine (ZYRTEC) 10 MG tablet Take 10 mg by mouth daily.    Marland Kitchen EPINEPHrine 0.3 mg/0.3 mL IJ SOAJ injection Use As Directed In Need of Emergency.    Marland Kitchen ibuprofen (ADVIL,MOTRIN) 600 MG tablet Take 1 tablet (600 mg total) by mouth every 6 (six) hours as needed. 30 tablet 0  . cyclobenzaprine (FLEXERIL) 5 MG tablet Take 1 tablet (5 mg total) by mouth 3 (three) times daily as needed for muscle spasms. 30 tablet 1  . HYDROcodone-acetaminophen (NORCO/VICODIN) 5-325 MG tablet Take 1 tablet by mouth every 4 (four) hours as needed. (Patient not taking: Reported on 02/20/2017)  10 tablet 0  . SPRINTEC 28 0.25-35 MG-MCG tablet Take 1 tablet by mouth daily.     No current facility-administered medications for this visit.     Allergies:  Allergies  Allergen Reactions  . Peanuts [Peanut Oil] Swelling  . Citrus Hives    Past Surgical History:  Procedure Laterality Date  . DILATION AND CURETTAGE OF UTERUS    . WISDOM TOOTH EXTRACTION      Social History   Socioeconomic History  . Marital status: Married    Spouse name: None  . Number of children: 2  . Years of education: None  . Highest education level: None  Social Needs  . Financial resource strain: None  . Food insecurity - worry: None  . Food insecurity - inability: None  . Transportation needs - medical: None  . Transportation needs - non-medical: None  Occupational History  . Occupation: REGISTRATION    Employer: [employer]  Tobacco Use  . Smoking status: Former Smoker    Types: Cigarettes    Last attempt to quit: 09/07/2011    Years since quitting: 5.4  . Smokeless tobacco: Never Used  Substance and Sexual Activity  . Alcohol use: Yes    Comment: rarely  . Drug use: No  . Sexual activity: Yes    Partners:  Male  Other Topics Concern  . None  Social History Narrative  . None    Family History  Problem Relation Age of Onset  . Multiple sclerosis Mother   . Diabetes Father   . Asthma Brother   . Diabetes Maternal Grandfather   . Hypertension Maternal Grandfather   . Prostate cancer Maternal Grandfather      ROS Review of Systems See HPI Constitution: No fevers or chills No malaise No diaphoresis Skin: No rash or itching Eyes: no blurry vision, no double vision GU: no dysuria or hematuria Neuro: no dizziness or headaches  all others reviewed and negative   Objective: Vitals:   02/20/17 1701  BP: 129/86  Pulse: (!) 102  Resp: 17  Temp: 99.5 F (37.5 C)  TempSrc: Oral  SpO2: 100%  Weight: 223 lb (101.2 kg)  Height: 5\' 5"  (1.651 m)     Physical Exam  Constitutional: She is oriented to person, place, and time. She appears well-developed and well-nourished.  HENT:  Head: Normocephalic and atraumatic.  Eyes: Conjunctivae and EOM are normal.  Cardiovascular: Normal rate, regular rhythm and normal heart sounds.  No murmur heard. Pulmonary/Chest: Effort normal and breath sounds normal. No stridor. No respiratory distress. She has no wheezes.  Musculoskeletal:       Right shoulder: She exhibits spasm. She exhibits normal range of motion, no tenderness, no bony tenderness, no swelling, no effusion, no crepitus, no deformity, no laceration, no pain, normal pulse and normal strength.  Neurological: She is alert and oriented to person, place, and time.  Skin: Skin is warm. Capillary refill takes less than 2 seconds.  Psychiatric: She has a normal mood and affect. Her behavior is normal. Judgment and thought content normal.  right trapezius muscle with spasm   Assessment and Plan Michelle Pacheco was seen today for new patient (initial visit).  Diagnoses and all orders for this visit:  Trapezius muscle spasm- discussed that she has muscle spasm She should avoid lifting Go to PT Continue nsaid Also use flexeril And topical aspercreme -     Ambulatory referral to Physical Therapy -     cyclobenzaprine (FLEXERIL) 5 MG tablet; Take 1 tablet (5 mg total) by mouth 3 (three) times daily as needed for muscle spasms.     Michelle Pacheco A Charletta Voight

## 2017-02-20 NOTE — Patient Instructions (Addendum)
[facility] [phone] Please call Julieanne Cotton, Office Manager to set up an appointment Office Hours: Weekdays [address] [place], [place] [address] (Inside the [facility])     Also try Aspercreme with lidocaine     IF you received an x-ray today, you will receive an invoice from [facility]. Please contact [facility] at [phone] with questions or concerns regarding your invoice.   IF you received labwork today, you will receive an invoice from [facility]. Please contact [facility] at [phone] with questions or concerns regarding your invoice.   Our billing staff will not be able to assist you with questions regarding bills from these companies.  You will be contacted with the lab results as soon as they are available. The fastest way to get your results is to activate your My Chart account. Instructions are located on the last page of this paperwork. If you have not heard from [place] regarding the results in 2 weeks, please contact this office.     Muscle Cramps and Spasms Muscle cramps and spasms occur when a muscle or muscles tighten and you have no control over this tightening (involuntary muscle contraction). They are a common problem and can develop in any muscle. The most common place is in the calf muscles of the leg. Muscle cramps and muscle spasms are both involuntary muscle contractions, but there are some differences between the two:  Muscle cramps are painful. They come and go and may last a few seconds to 15 minutes. Muscle cramps are often more forceful and last longer than muscle spasms.  Muscle spasms may or may not be painful. They may also last just a few seconds or much longer.  Certain medical conditions, such as diabetes or Parkinson disease, can make it more likely to develop cramps or spasms. However, cramps or spasms are usually not caused by a serious underlying problem. Common causes  include:  Overexertion.  Overuse from repetitive motions, or doing the same thing over and over.  Remaining in a certain position for a long period of time.  Improper preparation, form, or technique while playing a sport or doing an activity.  Dehydration.  Injury.  Side effects of some medicines.  Abnormally low levels of the salts and ions in your blood (electrolytes), especially potassium and calcium. This could happen if you are taking water pills (diuretics) or if you are pregnant.  In many cases, the cause of muscle cramps or spasms is unknown. Follow these instructions at home:  Stay well hydrated. Drink enough fluid to keep your urine clear or pale yellow.  Try massaging, stretching, and relaxing the affected muscle.  If directed, apply heat to tight or tense muscles as often as told by your health care provider. Use the heat source that your health care provider recommends, such as a moist heat pack or a heating pad. ? Place a towel between your skin and the heat source. ? Leave the heat on for 20-30 minutes. ? Remove the heat if your skin turns bright red. This is especially important if you are unable to feel pain, heat, or cold. You may have a greater risk of getting burned.  If directed, put ice on the affected area. This may help if you are sore or have pain after a cramp or spasm. ? Put ice in a plastic bag. ? Place a towel between your skin and the bag. ? Leavethe ice on for 20 minutes, 2-3 times a day.  Take over-the-counter and  prescription medicines only as told by your health care provider.  Pay attention to any changes in your symptoms. Contact a health care provider if:  Your cramps or spasms get more severe or happen more often.  Your cramps or spasms do not improve over time. This information is not intended to replace advice given to you by your health care provider. Make sure you discuss any questions you have with your health care  provider. Document Released: 07/23/2001 Document Revised: 03/04/2015 Document Reviewed: 11/04/2014 Elsevier Interactive Patient Education  2018 [employer].

## 2017-02-23 ENCOUNTER — Telehealth: Payer: Self-pay | Admitting: Family Medicine

## 2017-02-23 NOTE — Telephone Encounter (Signed)
Returned call from [name] and left a vm giving them pt's phone num so they can contact pt.Michelle Pacheco

## 2017-02-24 ENCOUNTER — Telehealth: Payer: Self-pay | Admitting: Family Medicine

## 2017-02-24 MED ORDER — PREDNISONE 20 MG PO TABS: 40.0000 mg | ORAL_TABLET | Freq: Every day | ORAL | 0 refills | Status: AC

## 2017-02-24 MED ORDER — MELOXICAM 7.5 MG PO TABS: 7.5000 mg | ORAL_TABLET | Freq: Every day | ORAL | 0 refills | Status: DC

## 2017-02-24 MED ORDER — HYDROCODONE-ACETAMINOPHEN 5-325 MG PO TABS: 1.0000 | ORAL_TABLET | ORAL | 0 refills | Status: DC | PRN

## 2017-02-24 NOTE — Telephone Encounter (Signed)
Copied from CRM [id number]. Topic: Quick Communication - See Telephone Encounter >> Feb 24, 2017  8:47 AM Diana Eves B wrote: CRM for notification. See Telephone encounter for:  Pt seen Dr. Eldred Manges on 02/20/17 and is wondering if something could be called in for pain that she can during the day. Pt states pain was about a 7 and now its about a 9 she doesn't know if she has made it worse being at work. She is having trouble getting through her daily routine 02/24/17.

## 2017-02-24 NOTE — Telephone Encounter (Signed)
Please notify the patient that I have sent in 2 medications.  One is predisone to help the inflammation in the muscle. She should take that with food starting today with lunch.  The other is a refill of norco which she has taken in the past. This should only be taking for pain that is 9/10 or greater. Follow up with physical therapy

## 2017-02-24 NOTE — Telephone Encounter (Signed)
Noted! Thank you

## 2017-02-24 NOTE — Telephone Encounter (Signed)
Patient was informed.

## 2017-02-28 ENCOUNTER — Ambulatory Visit: Payer: 59 | Attending: Family Medicine | Admitting: Physical Therapy

## 2017-02-28 ENCOUNTER — Encounter: Payer: Self-pay | Admitting: Physical Therapy

## 2017-02-28 DIAGNOSIS — M542 Cervicalgia: Secondary | ICD-10-CM | POA: Diagnosis not present

## 2017-02-28 DIAGNOSIS — M62838 Other muscle spasm: Secondary | ICD-10-CM | POA: Diagnosis not present

## 2017-02-28 NOTE — Therapy (Addendum)
[place], [place], [address] Phone: [phone]   Fax:  [phone]  Physical Therapy Evaluation/ Discharge  Patient Details  Name: Michelle Pacheco MRN: [id number] Date of Birth: 1985/08/25 Referring Provider: Dr Delia Chimes    Encounter Date: 02/28/2017  PT End of Session - 02/28/17 1436    Visit Number  1    Number of Visits  16    Date for PT Re-Evaluation  04/25/17    Authorization Type  [facility]     PT Start Time  3383    PT Stop Time  1240    PT Time Calculation (min)  55 min    Activity Tolerance  Patient tolerated treatment well    Behavior During Therapy  [facility] for tasks assessed/performed       Past Medical History:  Diagnosis Date  . Anemia   . Anxiety   . Blood transfusion without reported diagnosis   . Medical history non-contributory     Past Surgical History:  Procedure Laterality Date  . DILATION AND CURETTAGE OF UTERUS    . WISDOM TOOTH EXTRACTION      There were no vitals filed for this visit.   Subjective Assessment - 02/28/17 1200    Subjective  Patient reports around Christmas she was sick with a cough. After Christmas she began to have pain in her neck. The pain started on the right but now goes actross her shoulders. She has pain at times into her right arm.     Pertinent History  anxiety     Limitations  Standing;Walking    Patient Stated Goals  to have less pain in her neck     Currently in Pain?  Yes    Pain Score  5     Pain Location  Neck    Pain Orientation  Right    Pain Descriptors / Indicators  Aching    Pain Type  Acute pain    Pain Onset  1 to 4 weeks ago    Pain Frequency  Constant    Aggravating Factors   turning the head    Pain Relieving Factors  prednisone;     Effect of Pain on Daily Activities  pain with daily activity          [facility] PT Assessment - 02/28/17 0001      Assessment   Medical Diagnosis  Neck pain     Referring Provider  Dr  Delia Chimes     Onset Date/Surgical Date  -- Around December 25th     Next MD Visit  No follow up     Prior Therapy  None       Precautions   Precautions  None      Restrictions   Weight Bearing Restrictions  No      Balance Screen   Has the patient fallen in the past 6 months  No    Has the patient had a decrease in activity level because of a fear of falling?   No    Is the patient reluctant to leave their home because of a fear of falling?   No      Home Environment   Additional Comments  Has a 34 and an 34 year old       Prior Function   Level of Independence  Independent    Vocation  Full time employment    Biomedical scientist  Works for [employer]  Leisure  Would like to o to the gym       Observation/Other Assessments   Focus on Therapeutic Outcomes (FOTO)   54% limitation       Sensation   Additional Comments  radiating pain into the right upper extrmity       Coordination   Gross Motor Movements are Fluid and Coordinated  Yes    Fine Motor Movements are Fluid and Coordinated  Yes      Posture/Postural Control   Posture Comments  rounded shoulder; forward head mild       ROM / Strength   AROM / PROM / Strength  AROM;PROM;Strength      AROM   AROM Assessment Site  Cervical    Cervical Flexion  20    Cervical Extension  30    Cervical - Right Side Bend  Paint 50% restricted movement     Cervical - Right Rotation  38     Cervical - Left Rotation  50       Strength   Strength Assessment Site  Shoulder    Right/Left Shoulder  Right;Left    Right Shoulder Flexion  4/5    Right Shoulder External Rotation  4/5    Right Shoulder Horizontal ABduction  4+/5    Left Shoulder Flexion  5/5    Left Shoulder Internal Rotation  5/5    Left Shoulder External Rotation  5/5      Palpation   Palpation comment  significant spasming of the right upper trap. Mild in the left              Objective measurements completed on examination: See above findings.       [facility] PT Treatment/Exercise - 02/28/17 0001      Neck Exercises: Seated   Other Seated Exercise  scpa retraction x10;     Other Seated Exercise  cervical rotation x3 each way with cuing       Neck Exercises: Stretches   Upper Trapezius Stretch Limitations  2x20 sec hold for right     Levator Stretch Limitations  2x20 sec hold for right              PT Education - 02/28/17 1435    Education provided  Yes    Education Details  HEP; symptom management     Person(s) Educated  Patient    Methods  Explanation;Demonstration;Tactile cues;Verbal cues    Comprehension  Verbalized understanding;Returned demonstration;Verbal cues required;Tactile cues required       PT Short Term Goals - 02/28/17 1451      PT SHORT TERM GOAL #1   Title  Patient will increase cervical rotation to the right by 20 degrees     Time  4    Period  Weeks    Status  New    Target Date  03/28/17      PT SHORT TERM GOAL #2   Title  Patient will increase cervical extension by 10 degrees without pain     Time  4    Period  Weeks    Status  New    Target Date  03/28/17      PT SHORT TERM GOAL #3   Title  Patient will report decreased tenderness to palpation of the upper trap     Time  4    Period  Weeks    Status  New    Target Date  03/28/17  PT Long Term Goals - 02/28/17 1452      PT LONG TERM GOAL #1   Title  Patient will increase cervical rotation to 75 degrees bilateral without pain in order to perfrom work tasks     Time  8    Period  Weeks    Status  New    Target Date  04/25/17      PT LONG TERM GOAL #2   Title  Patient will demostreate 5/5 bilateral upper extremity strength in order to perfrom work tasks     Time  8    Period  Weeks    Status  New    Target Date  04/25/17      PT LONG TERM GOAL #3   Title  Patient will demsotrate a 31% limitation on FOTO     Time  8    Period  Weeks    Status  New    Target Date  04/25/17             Plan -  02/28/17 1437    Clinical Impression Statement  Patient is a 32 year old fmale who presents with cervical pain and upper trap tightness R> L. Sings and symptoms are consitent with facet joint dysfucntion. She has limited rotation and sidebending to the  right. She had improved motion with manual therapy. She would benefit from skilled therapy to improve her ability to function at work and improve her safety with driving.     History and Personal Factors relevant to plan of care:  anxiety     Clinical Presentation  Evolving    Clinical Decision Making  Moderate    Rehab Potential  Good    PT Frequency  2x / week    PT Duration  8 weeks    PT Treatment/Interventions  ADLs/Self Care Home Management;Cryotherapy;Electrical Stimulation;Iontophoresis 2m/ml Dexamethasone;Moist Heat;Ultrasound;Therapeutic exercise;Therapeutic activities;Gait training;Stair training;Neuromuscular re-education;Patient/family education;Passive range of motion;Manual techniques;Taping;Dry needling    PT Next Visit Plan  consider TPDN; continue with manual therapy for manual taction and upper trap trigger point release; consdier iastym add resisted retraction and extension.     PT Home Exercise Plan  cervical rotation; scpa retraction; upper trap stretch; levator stretch    Consulted and Agree with Plan of Care  Patient       Patient will benefit from skilled therapeutic intervention in order to improve the following deficits and impairments:  Pain, Increased muscle spasms, Decreased range of motion, Impaired UE functional use, Decreased activity tolerance, Decreased strength, Postural dysfunction, Improper body mechanics  Visit Diagnosis: Cervicalgia - Plan: PT plan of care cert/re-cert  Other muscle spasm - Plan: PT plan of care cert/re-cert   PHYSICAL THERAPY DISCHARGE SUMMARY  Visits from Start of Care: 1  Current functional level related to goals / functional outcomes: Did not come for follow up    Remaining  deficits: Did not come for follow up     Education / Equipment: Did not come for follow up  Plan: Patient agrees to discharge.  Patient goals were not met. Patient is being discharged due to not returning since the last visit.  ?????       Problem List There are no active problems to display for this patient.   DCarney LivingPT DPT  02/28/2017, 3:06 PM  [facility] [address]Phone: [phone]  Fax:  [phone] Name: CORLENE SALMONSMRN: [id number] of  Birth: 11-19-1985

## 2017-03-07 ENCOUNTER — Ambulatory Visit: Payer: 59

## 2017-03-09 ENCOUNTER — Encounter: Payer: 59 | Admitting: Physical Therapy

## 2017-03-14 ENCOUNTER — Ambulatory Visit: Payer: 59 | Admitting: Physical Therapy

## 2017-03-21 ENCOUNTER — Encounter: Payer: 59 | Admitting: Physical Therapy

## 2017-03-23 ENCOUNTER — Other Ambulatory Visit: Payer: Self-pay | Admitting: Family Medicine

## 2017-03-30 DIAGNOSIS — Z131 Encounter for screening for diabetes mellitus: Secondary | ICD-10-CM | POA: Diagnosis not present

## 2017-03-30 DIAGNOSIS — N920 Excessive and frequent menstruation with regular cycle: Secondary | ICD-10-CM | POA: Diagnosis not present

## 2017-03-30 DIAGNOSIS — Z114 Encounter for screening for human immunodeficiency virus [HIV]: Secondary | ICD-10-CM | POA: Diagnosis not present

## 2017-03-30 DIAGNOSIS — Z6838 Body mass index (BMI) 38.0-38.9, adult: Secondary | ICD-10-CM | POA: Diagnosis not present

## 2017-03-30 DIAGNOSIS — Z113 Encounter for screening for infections with a predominantly sexual mode of transmission: Secondary | ICD-10-CM | POA: Diagnosis not present

## 2017-03-30 DIAGNOSIS — L659 Nonscarring hair loss, unspecified: Secondary | ICD-10-CM | POA: Diagnosis not present

## 2017-03-30 DIAGNOSIS — Z1159 Encounter for screening for other viral diseases: Secondary | ICD-10-CM | POA: Diagnosis not present

## 2017-03-30 DIAGNOSIS — R5383 Other fatigue: Secondary | ICD-10-CM | POA: Diagnosis not present

## 2017-03-30 DIAGNOSIS — Z01419 Encounter for gynecological examination (general) (routine) without abnormal findings: Secondary | ICD-10-CM | POA: Diagnosis not present

## 2017-03-30 DIAGNOSIS — Z118 Encounter for screening for other infectious and parasitic diseases: Secondary | ICD-10-CM | POA: Diagnosis not present

## 2017-04-25 DIAGNOSIS — O26851 Spotting complicating pregnancy, first trimester: Secondary | ICD-10-CM | POA: Diagnosis not present

## 2017-04-25 DIAGNOSIS — Z3201 Encounter for pregnancy test, result positive: Secondary | ICD-10-CM | POA: Diagnosis not present

## 2017-04-25 DIAGNOSIS — O26859 Spotting complicating pregnancy, unspecified trimester: Secondary | ICD-10-CM | POA: Diagnosis not present

## 2017-04-25 DIAGNOSIS — Z3A01 Less than 8 weeks gestation of pregnancy: Secondary | ICD-10-CM | POA: Diagnosis not present

## 2017-04-27 DIAGNOSIS — Z3201 Encounter for pregnancy test, result positive: Secondary | ICD-10-CM | POA: Diagnosis not present

## 2017-05-12 DIAGNOSIS — Z3689 Encounter for other specified antenatal screening: Secondary | ICD-10-CM | POA: Diagnosis not present

## 2017-05-12 DIAGNOSIS — Z3481 Encounter for supervision of other normal pregnancy, first trimester: Secondary | ICD-10-CM | POA: Diagnosis not present

## 2017-05-12 LAB — OB RESULTS CONSOLE ANTIBODY SCREEN: ANTIBODY SCREEN: NEGATIVE

## 2017-05-12 LAB — OB RESULTS CONSOLE RUBELLA ANTIBODY, IGM: RUBELLA: IMMUNE

## 2017-05-12 LAB — OB RESULTS CONSOLE HIV ANTIBODY (ROUTINE TESTING): HIV: NONREACTIVE

## 2017-05-12 LAB — OB RESULTS CONSOLE RPR: RPR: NONREACTIVE

## 2017-05-12 LAB — OB RESULTS CONSOLE HEPATITIS B SURFACE ANTIGEN: Hepatitis B Surface Ag: NEGATIVE

## 2017-05-12 LAB — OB RESULTS CONSOLE ABO/RH: RH Type: POSITIVE

## 2017-05-24 DIAGNOSIS — Z3481 Encounter for supervision of other normal pregnancy, first trimester: Secondary | ICD-10-CM | POA: Diagnosis not present

## 2017-05-24 DIAGNOSIS — Z3689 Encounter for other specified antenatal screening: Secondary | ICD-10-CM | POA: Diagnosis not present

## 2017-05-24 LAB — OB RESULTS CONSOLE GC/CHLAMYDIA
Chlamydia: NEGATIVE
Gonorrhea: NEGATIVE

## 2017-06-09 MED FILL — CITRANATAL 90 DHA COMBO PAC: 90-1 & 300 | 30 days supply | Qty: 60 | Fill #0

## 2017-06-14 DIAGNOSIS — O418X10 Other specified disorders of amniotic fluid and membranes, first trimester, not applicable or unspecified: Secondary | ICD-10-CM | POA: Diagnosis not present

## 2017-06-14 DIAGNOSIS — Z3A13 13 weeks gestation of pregnancy: Secondary | ICD-10-CM | POA: Diagnosis not present

## 2017-07-05 DIAGNOSIS — R5383 Other fatigue: Secondary | ICD-10-CM | POA: Diagnosis not present

## 2017-07-05 DIAGNOSIS — Z361 Encounter for antenatal screening for raised alphafetoprotein level: Secondary | ICD-10-CM | POA: Diagnosis not present

## 2017-07-25 DIAGNOSIS — Z363 Encounter for antenatal screening for malformations: Secondary | ICD-10-CM | POA: Diagnosis not present

## 2017-07-27 ENCOUNTER — Encounter: Payer: 59 | Attending: Obstetrics and Gynecology | Admitting: Registered"

## 2017-07-27 ENCOUNTER — Encounter: Payer: Self-pay | Admitting: Registered"

## 2017-07-27 DIAGNOSIS — O2600 Excessive weight gain in pregnancy, unspecified trimester: Secondary | ICD-10-CM | POA: Diagnosis not present

## 2017-07-27 DIAGNOSIS — Z713 Dietary counseling and surveillance: Secondary | ICD-10-CM | POA: Diagnosis not present

## 2017-07-27 NOTE — Progress Notes (Signed)
Medical Nutrition Therapy:  Appt start time: 1620 end time:  1705.  Assessment:  Primary concerns today: Patient states she started gaining weight recently (before becoming pregnant) and wasn't sure why since she had pretty much the same eating routine as the year before when she was losing weight.  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 Has a hard time turning mind off.  Patient states she has a hard time drinking enough water, has tried setting alarm to drink water.   Pt states she has had a hard time eating enough food and first trimester was hard with nausea; it's getting better but still doesn't always eat enough. Pt states yesterday all she had was a smoothie she drank throughout the day and that happens often on the weekends because she is very busy taking care of 2 young children. Dr. Cherly Hensen recommended she use a journal make her aware of eating habits and to avoid skipping meals.   Pt states she doesn't get much exercise and work is sedentary. Pt states she was taking birth control to control periods and as soon as she came off she got pregnant. Pt reports that pregnancy was not planned and first trimester was difficult with nausea and had to be careful to not exert too much, could not do more than some walking.  Pt states she has had chronic constipation and anemic for a long time. Pt takes prenatal vitamin with iron and stool softeners. Pt states she also has vitamin D deficiency but didn't want to take another pill so has not been taking recently.  Preferred Learning Style:   No preference indicated   Learning Readiness:   Ready  MEDICATIONS: reviewed   DIETARY INTAKE:  Avoided foods include beef for health reasons husband recently dx HTN, pork religious.   Allergies: Peanuts, citrus  24-hr  recall:  B ( AM): oatmeal, banana OR smoothie- premier shake with frozen fruit Snk ( AM): fruit OR yogurt L ( PM): salad OR left overs Snk ( PM): cheezits OR goldfish OR apple D ( PM): chicken or fish (whiting, salmon, shrimp) 4x last week, vegetable, starch (rice, pasta or potato) Snk ( PM): none Beverages: water (not a fan), 1/2 c coffee   Usual physical activity: no restrictions. ADLs   Estimated energy needs: 2400 calories  Progress Towards Goal(s):  New goals.   Nutritional Diagnosis:  NI-5.10.1 Inadequate mineral intake (specify): Iron As related to not enough dietary iron to compensate for anemia.  As evidenced by inadequate caloric intake and fatigue     Intervention:  Nutrition Education. Discussed iron food sources and role of vit C in absorption. Began discussion of managing constipation which may be a side effect of iron supplementation. Discussed many causes of weight gain including PCOS.  Plan: Consider resuming the vitamin D supplementation as your doctor advised. Consider eating foods rich in iron paired with foods high in vitamin C to help absorption. Continue to find strategies to drink more water.  Teaching Method Utilized:  Visual Auditory  Handouts given during visit include:  Iron AND handout  Sleep Hygiene, self-assessment  Barriers to learning/adherence to lifestyle change: none  Demonstrated degree of understanding via:  Teach Back   Monitoring/Evaluation:  Dietary intake, exercise, energy level, and body weight in 2 week(s).

## 2017-07-27 NOTE — Patient Instructions (Addendum)
Consider resuming the vitamin D supplementation as your doctor advised. Consider eating foods rich in iron paired with foods high in vitamin C to help absorption. Continue to find strategies to drink more water.

## 2017-08-14 ENCOUNTER — Encounter: Payer: 59 | Attending: Obstetrics and Gynecology | Admitting: Registered"

## 2017-08-14 DIAGNOSIS — Z713 Dietary counseling and surveillance: Secondary | ICD-10-CM | POA: Insufficient documentation

## 2017-08-14 DIAGNOSIS — O2600 Excessive weight gain in pregnancy, unspecified trimester: Secondary | ICD-10-CM | POA: Diagnosis not present

## 2017-08-14 MED FILL — CITRANATAL 90 DHA COMBO PAC: 90-1 & 300 | 30 days supply | Qty: 60 | Fill #1

## 2017-08-14 NOTE — Patient Instructions (Addendum)
Ovasitol supplement can be taken during pregnancy Constipation: Sunsweet prunes come individually wrapped, apples, kiwis, pears, figs. Consider getting your Vitamin D prescription filled When packing snacks and water for your kids, pack for yourself. Add more veggies in your diet Protein with your breakfast is important

## 2017-08-14 NOTE — Progress Notes (Signed)
Medical Nutrition Therapy:  Appt start time: 1150 end time:  1225.  Follow-up Assessment:  Primary concerns today: Patient states it was hard to make sure she was eating often enough and has felt sick often when she skips meals or snacks. Pt states they are closing on their house and have been working on packing.   Pt states her doctor, Iran Planas, prescribed metformin for PCOS, but she was not able to tolerate it. Patient states she was breastfeeding with prior pregnancies but had a hard time producing enough milk. RD discussed that metformin has been shown to help with breastfeeding, but since she cannot tolerate it, inositol may have similar effect and can be taken during pregnancy.   Patient states she still is working on drinking enough water, has tried setting alarm to drink water, had a water container with her during visit.   Patient states she has not filled her vitamin D prescription yet, but will make it a priority.  Relevant Problem list:  PCOS, fatigue, hair loss, vit D deficiency, anemia, Hx pre-eclampsia, Labs 03/30/2017: A1c 5.2% (WNL), Glu 81 (WNL); Vit D 9.1 (L)  A1c 5.7% 3 yrs ago  Work: 8:30-5 - sendentary Sleep: about 6 hrs on a good day. Has a hard time turning mind off.  Preferred Learning Style:   No preference indicated   Learning Readiness:   Ready  MEDICATIONS: reviewed   DIETARY INTAKE:  Avoided foods include beef for health reasons husband recently dx HTN, pork religious.   Allergies: Peanuts, citrus  24-hr recall:  B ( AM): steel cut oatmeal  Snk ( AM): none L ( PM): bagel egg, diulted coffee Snk ( PM): none (busy yesterday)  D (8:30 PM): fried chicken thigh, mac & coleslaw,  Snk ( PM): none Beverages: water (not a fan), 1/2 c coffee   Usual physical activity: no restrictions. ADLs   Estimated energy needs: 2400 calories  Progress Towards Goal(s):  New goals.   Nutritional Diagnosis:  NI-5.10.1 Inadequate mineral intake (specify): Iron  As related to not enough dietary iron to compensate for anemia.  As evidenced by inadequate caloric intake and fatigue     Intervention:  Nutrition Education. Discussed inositol supplement. Discussed strategies for managing constipation Dicussed importance to include protein with meals, especially with breakfast.  Plan: Ovasitol supplement can be taken during pregnancy Constipation: Sunsweet prunes come individually wrapped, apples, kiwis, pears, figs. Consider getting your Vitamin D prescription filled When packing snacks and water for your kids, pack for yourself. Add more veggies in your diet Protein with your breakfast is important  Teaching Method Utilized:  Visual Auditory  Handouts given during visit include:  What is Inositol  Barriers to learning/adherence to lifestyle change: none  Demonstrated degree of understanding via:  Teach Back   Monitoring/Evaluation:  Dietary intake, exercise, energy level, and body weight prn.

## 2017-08-25 DIAGNOSIS — Z3A23 23 weeks gestation of pregnancy: Secondary | ICD-10-CM | POA: Diagnosis not present

## 2017-08-25 DIAGNOSIS — O26892 Other specified pregnancy related conditions, second trimester: Secondary | ICD-10-CM | POA: Diagnosis not present

## 2017-08-25 DIAGNOSIS — N898 Other specified noninflammatory disorders of vagina: Secondary | ICD-10-CM | POA: Diagnosis not present

## 2017-08-30 DIAGNOSIS — Z362 Encounter for other antenatal screening follow-up: Secondary | ICD-10-CM | POA: Diagnosis not present

## 2017-09-26 DIAGNOSIS — Z3689 Encounter for other specified antenatal screening: Secondary | ICD-10-CM | POA: Diagnosis not present

## 2017-10-24 MED FILL — CITRANATAL 90 DHA COMBO PAC: 90-1 & 300 | 30 days supply | Qty: 60 | Fill #2

## 2017-10-25 DIAGNOSIS — O3663X0 Maternal care for excessive fetal growth, third trimester, not applicable or unspecified: Secondary | ICD-10-CM | POA: Diagnosis not present

## 2017-10-25 DIAGNOSIS — Z23 Encounter for immunization: Secondary | ICD-10-CM | POA: Diagnosis not present

## 2017-10-25 DIAGNOSIS — Z3A32 32 weeks gestation of pregnancy: Secondary | ICD-10-CM | POA: Diagnosis not present

## 2017-11-01 DIAGNOSIS — Z3483 Encounter for supervision of other normal pregnancy, third trimester: Secondary | ICD-10-CM | POA: Diagnosis not present

## 2017-11-01 DIAGNOSIS — Z3482 Encounter for supervision of other normal pregnancy, second trimester: Secondary | ICD-10-CM | POA: Diagnosis not present

## 2017-11-14 DIAGNOSIS — Z23 Encounter for immunization: Secondary | ICD-10-CM | POA: Diagnosis not present

## 2017-11-14 DIAGNOSIS — Z3685 Encounter for antenatal screening for Streptococcus B: Secondary | ICD-10-CM | POA: Diagnosis not present

## 2017-11-14 LAB — OB RESULTS CONSOLE GBS: GBS: POSITIVE

## 2017-12-18 MED FILL — CITRANATAL 90 DHA COMBO PAC: 90-1 & 300 | 30 days supply | Qty: 60 | Fill #3

## 2017-12-25 ENCOUNTER — Inpatient Hospital Stay (HOSPITAL_COMMUNITY)
Admission: AD | Admit: 2017-12-25 | Discharge: 2017-12-28 | DRG: 806 | Disposition: A | Discharge status: Home / Self Care | Payer: 59 | Attending: Obstetrics and Gynecology | Admitting: Obstetrics and Gynecology

## 2017-12-25 ENCOUNTER — Encounter (HOSPITAL_COMMUNITY): Payer: Self-pay

## 2017-12-25 DIAGNOSIS — Z3A41 41 weeks gestation of pregnancy: Secondary | ICD-10-CM

## 2017-12-25 DIAGNOSIS — O9081 Anemia of the puerperium: Secondary | ICD-10-CM | POA: Diagnosis not present

## 2017-12-25 DIAGNOSIS — O326XX0 Maternal care for compound presentation, not applicable or unspecified: Secondary | ICD-10-CM | POA: Diagnosis present

## 2017-12-25 DIAGNOSIS — O480 Post-term pregnancy: Principal | ICD-10-CM | POA: Diagnosis present

## 2017-12-25 DIAGNOSIS — O99824 Streptococcus B carrier state complicating childbirth: Secondary | ICD-10-CM | POA: Diagnosis present

## 2017-12-25 DIAGNOSIS — Z87891 Personal history of nicotine dependence: Secondary | ICD-10-CM | POA: Diagnosis not present

## 2017-12-25 DIAGNOSIS — N852 Hypertrophy of uterus: Secondary | ICD-10-CM | POA: Diagnosis not present

## 2017-12-25 DIAGNOSIS — D62 Acute posthemorrhagic anemia: Secondary | ICD-10-CM | POA: Diagnosis not present

## 2017-12-25 DIAGNOSIS — O3663X0 Maternal care for excessive fetal growth, third trimester, not applicable or unspecified: Secondary | ICD-10-CM | POA: Diagnosis not present

## 2017-12-25 LAB — CBC
HCT: 39.4 % (ref 36.0–46.0)
HEMOGLOBIN: 12.7 g/dL (ref 12.0–15.0)
MCH: 27.9 pg (ref 26.0–34.0)
MCHC: 32.2 g/dL (ref 30.0–36.0)
MCV: 86.4 fL (ref 80.0–100.0)
Platelets: 212 10*3/uL (ref 150–400)
RBC: 4.56 MIL/uL (ref 3.87–5.11)
RDW: 14.8 % (ref 11.5–15.5)
WBC: 6.1 10*3/uL (ref 4.0–10.5)
nRBC: 0 % (ref 0.0–0.2)

## 2017-12-25 MED ORDER — OXYTOCIN 40 UNITS IN LACTATED RINGERS INFUSION - SIMPLE MED
1.0000 m[IU]/min | INTRAVENOUS | Status: DC
  Administered 2017-12-25: 2 m[IU]/min via INTRAVENOUS
  Filled 2017-12-25: qty 1000

## 2017-12-25 MED ORDER — SOD CITRATE-CITRIC ACID 500-334 MG/5ML PO SOLN: 30.0000 mL | ORAL | Status: DC | PRN

## 2017-12-25 MED ORDER — ONDANSETRON HCL 4 MG/2ML IJ SOLN
4.0000 mg | Freq: Four times a day (QID) | INTRAMUSCULAR | Status: DC | PRN
  Administered 2017-12-26: 4 mg via INTRAVENOUS
  Filled 2017-12-25: qty 2

## 2017-12-25 MED ORDER — OXYCODONE-ACETAMINOPHEN 5-325 MG PO TABS: 1.0000 | ORAL_TABLET | ORAL | Status: DC | PRN

## 2017-12-25 MED ORDER — ACETAMINOPHEN 325 MG PO TABS: 650.0000 mg | ORAL_TABLET | ORAL | Status: DC | PRN

## 2017-12-25 MED ORDER — LIDOCAINE HCL (PF) 1 % IJ SOLN
30.0000 mL | INTRAMUSCULAR | Status: DC | PRN
  Filled 2017-12-25: qty 30

## 2017-12-25 MED ORDER — OXYTOCIN 40 UNITS IN LACTATED RINGERS INFUSION - SIMPLE MED: 2.5000 [IU]/h | INTRAVENOUS | Status: DC

## 2017-12-25 MED ORDER — OXYTOCIN BOLUS FROM INFUSION
500.0000 mL | Freq: Once | INTRAVENOUS | Status: AC
  Administered 2017-12-26: 500 mL via INTRAVENOUS

## 2017-12-25 MED ORDER — PENICILLIN G 3 MILLION UNITS IVPB - SIMPLE MED
3.0000 10*6.[IU] | INTRAVENOUS | Status: DC
  Administered 2017-12-26 (×2): 3 10*6.[IU] via INTRAVENOUS
  Filled 2017-12-25 (×3): qty 100

## 2017-12-25 MED ORDER — TERBUTALINE SULFATE 1 MG/ML IJ SOLN
0.2500 mg | Freq: Once | INTRAMUSCULAR | Status: DC | PRN
  Filled 2017-12-25: qty 1

## 2017-12-25 MED ORDER — LACTATED RINGERS IV SOLN
INTRAVENOUS | Status: DC
  Administered 2017-12-25 – 2017-12-26 (×2): via INTRAVENOUS

## 2017-12-25 MED ORDER — OXYTOCIN 10 UNIT/ML IJ SOLN: 10.0000 [IU] | Freq: Once | INTRAMUSCULAR | Status: DC

## 2017-12-25 MED ORDER — LACTATED RINGERS IV SOLN: 500.0000 mL | INTRAVENOUS | Status: DC | PRN

## 2017-12-25 MED ORDER — OXYCODONE-ACETAMINOPHEN 5-325 MG PO TABS: 2.0000 | ORAL_TABLET | ORAL | Status: DC | PRN

## 2017-12-25 MED ORDER — SODIUM CHLORIDE 0.9 % IV SOLN
5.0000 10*6.[IU] | Freq: Once | INTRAVENOUS | Status: AC
  Administered 2017-12-25: 5 10*6.[IU] via INTRAVENOUS
  Filled 2017-12-25: qty 5

## 2017-12-25 NOTE — Anesthesia Pain Management Evaluation Note (Signed)
  CRNA Pain Management Visit Note  Patient: Michelle Pacheco, 32 y.o., female  "Hello I am a member of the anesthesia team at [facility]. We have an anesthesia team available at all times to provide care throughout the hospital, including epidural management and anesthesia for C-section. I don't know your plan for the delivery whether it a natural birth, water birth, IV sedation, nitrous supplementation, doula or epidural, but we want to meet your pain goals."   1.Was your pain managed to your expectations on prior hospitalizations?   Yes   2.What is your expectation for pain management during this hospitalization?     Labor support without medications  3.How can we help you reach that goal? unsure  Record the patient's initial score and the patient's pain goal.   Pain: 2  Pain Goal: 10 [facility] wants you to be able to say your pain was always managed very well.  Cephus Shelling 12/25/2017

## 2017-12-25 NOTE — Progress Notes (Signed)
Pt done walking in hallway

## 2017-12-25 NOTE — Progress Notes (Signed)
Pt done walking in hallway, back in bed

## 2017-12-26 ENCOUNTER — Encounter (HOSPITAL_COMMUNITY): Payer: Self-pay | Admitting: *Deleted

## 2017-12-26 ENCOUNTER — Inpatient Hospital Stay (HOSPITAL_COMMUNITY): Payer: 59

## 2017-12-26 ENCOUNTER — Other Ambulatory Visit: Payer: Self-pay

## 2017-12-26 DIAGNOSIS — D62 Acute posthemorrhagic anemia: Secondary | ICD-10-CM

## 2017-12-26 LAB — CBC
HCT: 33.6 % — ABNORMAL LOW (ref 36.0–46.0)
HEMATOCRIT: 31.8 % — AB (ref 36.0–46.0)
HEMATOCRIT: 35 % — AB (ref 36.0–46.0)
HEMOGLOBIN: 11.4 g/dL — AB (ref 12.0–15.0)
HEMOGLOBIN: 10.5 g/dL — AB (ref 12.0–15.0)
Hemoglobin: 11.7 g/dL — ABNORMAL LOW (ref 12.0–15.0)
MCH: 28.7 pg (ref 26.0–34.0)
MCH: 28.4 pg (ref 26.0–34.0)
MCH: 28.3 pg (ref 26.0–34.0)
MCHC: 33.9 g/dL (ref 30.0–36.0)
MCHC: 33 g/dL (ref 30.0–36.0)
MCHC: 33.4 g/dL (ref 30.0–36.0)
MCV: 84.6 fL (ref 80.0–100.0)
MCV: 85.9 fL (ref 80.0–100.0)
MCV: 84.7 fL (ref 80.0–100.0)
NRBC: 0 % (ref 0.0–0.2)
Platelets: 199 10*3/uL (ref 150–400)
Platelets: 200 10*3/uL (ref 150–400)
Platelets: 169 10*3/uL (ref 150–400)
RBC: 3.97 MIL/uL (ref 3.87–5.11)
RBC: 3.7 MIL/uL — ABNORMAL LOW (ref 3.87–5.11)
RBC: 4.13 MIL/uL (ref 3.87–5.11)
RDW: 14.8 % (ref 11.5–15.5)
RDW: 14.7 % (ref 11.5–15.5)
RDW: 14.6 % (ref 11.5–15.5)
WBC: 5.9 10*3/uL (ref 4.0–10.5)
WBC: 8.7 10*3/uL (ref 4.0–10.5)
WBC: 13.5 10*3/uL — AB (ref 4.0–10.5)
nRBC: 0 % (ref 0.0–0.2)
nRBC: 0 % (ref 0.0–0.2)

## 2017-12-26 LAB — POCT I-STAT EG7
Acid-base deficit: 7 mmol/L — ABNORMAL HIGH (ref 0.0–2.0)
Bicarbonate: 19.4 mmol/L — ABNORMAL LOW (ref 20.0–28.0)
Calcium, Ion: 1.21 mmol/L (ref 1.15–1.40)
HCT: 33 % — ABNORMAL LOW (ref 36.0–46.0)
Hemoglobin: 11.2 g/dL — ABNORMAL LOW (ref 12.0–15.0)
O2 Saturation: 47 %
PCO2 VEN: 40.4 mmHg — AB (ref 44.0–60.0)
PO2 VEN: 29 mmHg — AB (ref 32.0–45.0)
Potassium: 3.9 mmol/L (ref 3.5–5.1)
Sodium: 137 mmol/L (ref 135–145)
TCO2: 21 mmol/L — ABNORMAL LOW (ref 22–32)
pH, Ven: 7.289 (ref 7.250–7.430)

## 2017-12-26 LAB — COMPREHENSIVE METABOLIC PANEL
ALBUMIN: 2.7 g/dL — AB (ref 3.5–5.0)
ALT: 23 U/L (ref 0–44)
ANION GAP: 7 (ref 5–15)
AST: 26 U/L (ref 15–41)
Alkaline Phosphatase: 74 U/L (ref 38–126)
BUN: 7 mg/dL (ref 6–20)
CALCIUM: 8.6 mg/dL — AB (ref 8.9–10.3)
CO2: 22 mmol/L (ref 22–32)
Chloride: 108 mmol/L (ref 98–111)
Creatinine, Ser: 0.59 mg/dL (ref 0.44–1.00)
Glucose, Bld: 90 mg/dL (ref 70–99)
Potassium: 4.2 mmol/L (ref 3.5–5.1)
Sodium: 137 mmol/L (ref 135–145)
Total Bilirubin: 0.9 mg/dL (ref 0.3–1.2)
Total Protein: 5.1 g/dL — ABNORMAL LOW (ref 6.5–8.1)

## 2017-12-26 LAB — DIC (DISSEMINATED INTRAVASCULAR COAGULATION)PANEL
D-Dimer, Quant: 1.55 ug/mL-FEU — ABNORMAL HIGH (ref 0.00–0.50)
Fibrinogen: 215 mg/dL (ref 210–475)
INR: 1.27
Prothrombin Time: 15.8 seconds — ABNORMAL HIGH (ref 11.4–15.2)
Smear Review: NONE SEEN

## 2017-12-26 LAB — POSTPARTUM HEMORRHAGE PROTOCOL (BB NOTIFICATION)

## 2017-12-26 LAB — URIC ACID: URIC ACID, SERUM: 2.7 mg/dL (ref 2.5–7.1)

## 2017-12-26 LAB — RPR: RPR Ser Ql: NONREACTIVE

## 2017-12-26 LAB — DIC (DISSEMINATED INTRAVASCULAR COAGULATION) PANEL
APTT: 26 s (ref 24–36)
PLATELETS: 104 10*3/uL — AB (ref 150–400)

## 2017-12-26 MED ORDER — METHYLERGONOVINE MALEATE 0.2 MG/ML IJ SOLN: 0.2000 mg | INTRAMUSCULAR | Status: DC | PRN

## 2017-12-26 MED ORDER — TRANEXAMIC ACID-NACL 1000-0.7 MG/100ML-% IV SOLN
1000.0000 mg | Freq: Once | INTRAVENOUS | Status: AC
  Administered 2017-12-26: 1000 mg via INTRAVENOUS

## 2017-12-26 MED ORDER — BENZOCAINE-MENTHOL 20-0.5 % EX AERO
1.0000 "application " | INHALATION_SPRAY | CUTANEOUS | Status: DC | PRN
  Administered 2017-12-26: 1 via TOPICAL
  Filled 2017-12-26: qty 56

## 2017-12-26 MED ORDER — ZOLPIDEM TARTRATE 5 MG PO TABS: 5.0000 mg | ORAL_TABLET | Freq: Every evening | ORAL | Status: DC | PRN

## 2017-12-26 MED ORDER — METHYLERGONOVINE MALEATE 0.2 MG PO TABS
0.2000 mg | ORAL_TABLET | ORAL | Status: DC
  Administered 2017-12-26 – 2017-12-27 (×5): 0.2 mg via ORAL
  Filled 2017-12-26 (×5): qty 1

## 2017-12-26 MED ORDER — METHYLERGONOVINE MALEATE 0.2 MG PO TABS: 0.2000 mg | ORAL_TABLET | ORAL | Status: DC | PRN

## 2017-12-26 MED ORDER — PRENATAL MULTIVITAMIN CH
1.0000 | ORAL_TABLET | Freq: Every day | ORAL | Status: DC
  Administered 2017-12-27: 1 via ORAL
  Filled 2017-12-26: qty 1

## 2017-12-26 MED ORDER — DIBUCAINE 1 % RE OINT: 1.0000 "application " | TOPICAL_OINTMENT | RECTAL | Status: DC | PRN

## 2017-12-26 MED ORDER — METHYLERGONOVINE MALEATE 0.2 MG/ML IJ SOLN: 0.2000 mg | INTRAMUSCULAR | Status: DC

## 2017-12-26 MED ORDER — LACTATED RINGERS IV BOLUS: 1000.0000 mL | Freq: Once | INTRAVENOUS | Status: DC

## 2017-12-26 MED ORDER — SIMETHICONE 80 MG PO CHEW: 80.0000 mg | CHEWABLE_TABLET | ORAL | Status: DC | PRN

## 2017-12-26 MED ORDER — METHYLERGONOVINE MALEATE 0.2 MG/ML IJ SOLN
0.2000 mg | Freq: Once | INTRAMUSCULAR | Status: AC
  Administered 2017-12-26: 0.2 mg via INTRAMUSCULAR

## 2017-12-26 MED ORDER — IBUPROFEN 600 MG PO TABS
600.0000 mg | ORAL_TABLET | Freq: Four times a day (QID) | ORAL | Status: DC
  Administered 2017-12-26 – 2017-12-28 (×6): 600 mg via ORAL
  Filled 2017-12-26 (×6): qty 1

## 2017-12-26 MED ORDER — ACETAMINOPHEN 325 MG PO TABS
650.0000 mg | ORAL_TABLET | ORAL | Status: DC | PRN
  Administered 2017-12-27: 650 mg via ORAL
  Filled 2017-12-26 (×2): qty 2

## 2017-12-26 MED ORDER — SENNOSIDES-DOCUSATE SODIUM 8.6-50 MG PO TABS
2.0000 | ORAL_TABLET | ORAL | Status: DC
  Administered 2017-12-27 (×2): 2 via ORAL
  Filled 2017-12-26 (×2): qty 2

## 2017-12-26 MED ORDER — ONDANSETRON HCL 4 MG/2ML IJ SOLN: 4.0000 mg | INTRAMUSCULAR | Status: DC | PRN

## 2017-12-26 MED ORDER — FENTANYL CITRATE (PF) 100 MCG/2ML IJ SOLN
50.0000 ug | Freq: Once | INTRAMUSCULAR | Status: AC
  Administered 2017-12-26: 100 ug via INTRAVENOUS
  Filled 2017-12-26: qty 2

## 2017-12-26 MED ORDER — ONDANSETRON HCL 4 MG PO TABS: 4.0000 mg | ORAL_TABLET | ORAL | Status: DC | PRN

## 2017-12-26 MED ORDER — LACTATED RINGERS IV SOLN: INTRAVENOUS | Status: DC

## 2017-12-26 MED ORDER — COCONUT OIL OIL
1.0000 "application " | TOPICAL_OIL | Status: DC | PRN
  Administered 2017-12-26: 1 via TOPICAL
  Filled 2017-12-26: qty 120

## 2017-12-26 MED ORDER — WITCH HAZEL-GLYCERIN EX PADS: 1.0000 "application " | MEDICATED_PAD | CUTANEOUS | Status: DC | PRN

## 2017-12-26 MED ORDER — SODIUM CHLORIDE 0.9 % IV SOLN
INTRAVENOUS | Status: DC
  Administered 2017-12-26: 09:00:00 via INTRAVENOUS

## 2017-12-26 MED ORDER — PHENYLEPHRINE 40 MCG/ML (10ML) SYRINGE FOR IV PUSH (FOR BLOOD PRESSURE SUPPORT)
PREFILLED_SYRINGE | INTRAVENOUS | Status: AC
  Administered 2017-12-26: 120 ug
  Filled 2017-12-26: qty 10

## 2017-12-26 MED ORDER — FERROUS SULFATE 325 (65 FE) MG PO TABS: 325.0000 mg | ORAL_TABLET | Freq: Two times a day (BID) | ORAL | Status: DC

## 2017-12-26 MED ORDER — TRANEXAMIC ACID-NACL 1000-0.7 MG/100ML-% IV SOLN
INTRAVENOUS | Status: AC
  Filled 2017-12-26: qty 100

## 2017-12-26 MED ORDER — DIPHENHYDRAMINE HCL 25 MG PO CAPS: 25.0000 mg | ORAL_CAPSULE | Freq: Four times a day (QID) | ORAL | Status: DC | PRN

## 2017-12-26 MED ORDER — EPHEDRINE 5 MG/ML INJ
INTRAVENOUS | Status: AC
  Filled 2017-12-26: qty 8

## 2017-12-26 MED ORDER — MISOPROSTOL 200 MCG PO TABS
ORAL_TABLET | ORAL | Status: AC
  Administered 2017-12-26: 800 ug
  Filled 2017-12-26: qty 4

## 2017-12-26 MED ORDER — FERROUS SULFATE 325 (65 FE) MG PO TABS
325.0000 mg | ORAL_TABLET | Freq: Two times a day (BID) | ORAL | Status: DC
  Administered 2017-12-26 – 2017-12-28 (×4): 325 mg via ORAL
  Filled 2017-12-26 (×4): qty 1

## 2017-12-26 MED ORDER — LACTATED RINGERS IV SOLN
INTRAVENOUS | Status: DC
  Administered 2017-12-26: 10:00:00 via INTRAVENOUS

## 2017-12-26 NOTE — Lactation Note (Signed)
This note was copied from a baby's chart. Lactation Consultation Note  Patient Name: Michelle Pacheco Date: 12/26/2017 Reason for consult: Initial assessment;Term;Maternal endocrine disorder Type of Endocrine Disorder?: PCOS  10 hours old FT female who is being exclusively BF by his mother, she's a P3 and experienced BF. She was able to BF her first child for 5 months and her second one for 10 months before he milk supply started to dwindle after she went back to work. Mom has a Lansinoh DEBP at home; she used to have Medela pumps in the past but they're too expensive now, so she decided on Lansinoh. She's familiar with hand expression, per mom and also per RN Michelle Pacheco mom has been able to get some colostrum.  Mom is aware that her PCOS condition had "something to do" with her low supplies issues in the past, and she really wants to BF for as long as she can this time. LC set her up with a DEBP, pump instructions, cleaning and storage were reviewed. Mom was concerns about getting sore from all that "pumping". LC requested RN Michelle Pacheco some coconut oil, and she'll be bringing it to the room. Michelle Pacheco present and supportive, he wanted to learn everything about the pump parts. Parents were very engaged during [facility] consultation and had lots of questions.   Offered assistance with latch but mom politely declined stating that baby already fed, he was asleep and not showing any feeding cues. Asked mom to call for assistance when needed. Discussed the onset of lactogenesis II, cluster feeding and baby sleeping cycle. Mom also requested some information about galactagogues, and discussed some options with LC.  Feeding plan:  1. Encouraged mom to feed baby STS at least 8-12 times/24 hours or sooner if feeding cues are present 2. Mom will start pumping every 3 hours; a total of 6-8 pumping sessions/24 hours. Will apply coconut oil on nipple/areola complex prior pumping 3. Hand expression and spoon feeding was also  encouraged  BF brochure, BF resources, feeding diary, fenugreek and lactation cookies handout were discussed. Mom also asked for a card to schedule a LC OP appt if needed after her discharge. Parents reported all questions and concerns were answered, they're both aware of LC services and will call PRN.  Maternal Data Formula Feeding for Exclusion: No Has patient been taught Hand Expression?: Yes Does the patient have breastfeeding experience prior to this delivery?: Yes  Feeding Feeding Type: Breast Fed  Interventions Interventions: Breast feeding basics reviewed;DEBP  Lactation Tools Discussed/Used Tools: Pump;Coconut oil Breast pump type: Double-Electric Breast Pump WIC Program: No Pump Review: Setup, frequency, and cleaning Initiated by:: Michelle Pacheco Date initiated:: 12/26/17   Consult Status Consult Status: Follow-up Date: 12/27/17 Follow-up type: In-patient    Boden Stucky Venetia Constable 12/26/2017, 5:43 PM

## 2017-12-26 NOTE — Progress Notes (Signed)
RN spoke with MD Cousins letting her know the pt is 9cm/BBOW and involuntary pushing. MD will notify CNM and update RN afterwards.

## 2017-12-26 NOTE — Progress Notes (Signed)
Pt in bathroom from [phone]

## 2017-12-26 NOTE — Progress Notes (Signed)
RN received 2 units of blood from blood bank.  .  Unable to scan the unit of blood due to emergent release.  Blood bank called to determine reason and unable to clarify.  Patient educated about blood, VS taken, and blood was given.   Unit 1: RBCs [id number] A Rh negative expiration 01/07/18.  First unit was verified by Erven Colla, RNC and Sharon Seller, RNC.  Unit 1 was started at 0915 and completed at 0935 with no S/S of reaction.  Unit 2: RBCs [id number] A Rh negative expiration 01/07/18.  Second unit was verified by Herma Carson, RN and Filiberto Pinks, RNC.  Unit 2 was started under pressure bag at 0935 and completed at 0952 with no S/S of reaction.

## 2017-12-26 NOTE — Progress Notes (Signed)
Patient ID: Michelle Pacheco, female   DOB: April 14, 1985, 32 y.o.   MRN: [id number]  Responded to emergency call from patient's room. Patient had become unresponsive. Blood pressure was low 77/14. Fundal massage resulted in of clot and blood. Trailing membrane also removed. Code hemorrhage called. Second IV started, methergine given, TXA started with second liter of fluid. Anesthesia arrived after code hemorrhage called - phenylephrine given with improvement of BP. After trailing membrane removed, bleeding improved. Due to QBL from delivery of > , called for 2 units of PRBCs. Patient's CNM arrived. Patient stable at that point and care turned over to her.  Levie Heritage, DO 12/26/2017 9:24 AM

## 2017-12-26 NOTE — Progress Notes (Signed)
POC discussed with pt. Pt verbalizes understanding and will notify RN of discomfort, pain, ROM, and bleeding.   

## 2017-12-26 NOTE — Progress Notes (Signed)
S ; c/o leaking fluid Has been ambulating in hall  O: BP 116/67 (BP Location: Right Arm)   Pulse 87   Temp 97.8 F (36.6 C) (Oral)   Resp 17   Ht 5\' 4"  (1.626 m)   Wt 111.9 kg   LMP 03/13/2017   Breastfeeding? Unknown   BMI 42.33 kg/m  VE deferred  Tracing: baseline 120 Ctx q 3-5 mins  IMP: Postdates GBS cx (+) on IV PCN P0 continue present mgmt

## 2017-12-26 NOTE — H&P (Signed)
Michelle Pacheco is a 32 y.o. female presenting for IOL 2nd to postdates. GBS cx (+). sono today: BPP 8/8, nl fluid, 8lb 2 oz. NST NR min variability OB History    Gravida  5   Para  2   Term  2   Preterm  0   AB  2   Living  2     SAB      TAB      Ectopic      Multiple      Live Births  2          Past Medical History:  Diagnosis Date  . Anemia   . Anxiety   . Blood transfusion without reported diagnosis   . Medical history non-contributory    Past Surgical History:  Procedure Laterality Date  . DILATION AND CURETTAGE OF UTERUS    . WISDOM TOOTH EXTRACTION     Family History: family history includes Asthma in her brother; Diabetes in her father and maternal grandfather; Hypertension in her maternal grandfather; Multiple sclerosis in her mother; Prostate cancer in her maternal grandfather. Social History:  reports that she quit smoking about 6 years ago. Her smoking use included cigarettes. She has never used smokeless tobacco. She reports that she drinks alcohol. She reports that she does not use drugs.     Maternal Diabetes: No Genetic Screening: Normal Maternal Ultrasounds/Referrals: Normal Fetal Ultrasounds or other Referrals:  None Maternal Substance Abuse:  No Significant Maternal Medications:  None Significant Maternal Lab Results:  Lab values include: Group B Strep positive Other Comments:  None  ROS Maternal Medical History:  Fetal activity: Perceived fetal activity is decreased.    Prenatal complications: no prenatal complications Prenatal Complications - Diabetes: none.    Dilation: 4 Effacement (%): 60 Station: -3 Exam by:: Belinda Fisher, RN Blood pressure 116/67, pulse 87, temperature 97.8 F (36.6 C), temperature source Oral, resp. rate 17, height 5\' 4"  (1.626 m), weight 111.9 kg, last menstrual period 03/13/2017, unknown if currently breastfeeding. Exam Physical Exam  Constitutional: She is oriented to person, place, and time. She  appears well-developed and well-nourished.  Eyes: EOM are normal.  Neck: Neck supple.  Cardiovascular: Regular rhythm.  Respiratory: Breath sounds normal.  GI: Soft.  Musculoskeletal: She exhibits edema.  Neurological: She is alert and oriented to person, place, and time.  Skin: Skin is warm and dry.  Psychiatric: She has a normal mood and affect.    Prenatal labs: ABO, Rh: --/--/AB POS (11/11 2020) Antibody: NEG (11/11 2020) Rubella: Immune (03/29 0000) RPR: Nonreactive (03/29 0000)  HBsAg: Negative (03/29 0000)  HIV: Non-reactive (03/29 0000)  GBS: Positive (10/01 0000)   Assessment/Plan: Postdates Multiparity GBS cx (+) P) admit routine labs. IV PCN. Desires natural childbirth. pitocin induction   Michelle Pacheco 12/26/2017, 2:06 AM

## 2017-12-26 NOTE — Progress Notes (Signed)
Bleeding has remained scant and fundus firm, midline, U/1. Vitals have been stable and WDL. Attempted to ambulate to bathroom and patient became nauseous and lightheaded. Pericare and foley care performed at bedside. Will attempt again after eating dinner and resting. Will continue to monitor.

## 2017-12-26 NOTE — Progress Notes (Signed)
Called by L&D nursing staff while in surgery at [facility] - informed that patient passed out and that code hemorrhage was called; then informed that Dr Adrian Blackwater was in room and patient with 500ebl with clots expressed; CNM had also been called and was on her way; informed that I would be on my way  Arrived in room and patient stable, no active bleeding Good uterine tone CNM in room, Dr Adrian Blackwater had already left room S/p 1 dose methergine, TXA started, first unit blood being given  Patient Vitals for the past 24 hrs:  BP Temp Temp src Pulse Resp SpO2 Height Weight  12/26/17 1032 92/78 - - (!) 127 18 - - -  12/26/17 1026 - - - - - 99 % - -  12/26/17 1021 - - - - - 100 % - -  12/26/17 1017 104/65 - - 73 - - - -  12/26/17 1002 (!) 87/56 - - 79 - - - -  12/26/17 1000 (!) 103/54 - - 74 - 100 % - -  12/26/17 0957 (!) 110/54 - - 79 - - - -  12/26/17 0955 (!) 105/41 - - 76 - 100 % - -  12/26/17 0952 (!) 109/53 - - 75 - 100 % - -  12/26/17 0945 (!) 104/49 98 F (36.7 C) Oral 78 - 100 % - -  12/26/17 0940 (!) 96/58 - - (!) 286 - 100 % - -  12/26/17 0935 (!) 109/32 98.3 F (36.8 C) Oral 81 - (!) 85 % - -  12/26/17 0930 (!) 103/44 - - 83 - 100 % - -  12/26/17 0925 (!) 102/47 - - 78 - 100 % - -  12/26/17 0920 (!) 100/43 - - 78 - 100 % - -  12/26/17 0915 (!) 86/63 98.5 F (36.9 C) Oral (!) 130 - 99 % - -  12/26/17 0910 110/65 - - 69 - 98 % - -  12/26/17 0905 96/61 - - 94 - 100 % - -  12/26/17 0900 (!) 77/14 - - (!) 211 16 98 % - -  12/26/17 0846 103/65 98.6 F (37 C) Oral 89 18 - - -  12/26/17 0831 107/71 - - 88 18 - - -  12/26/17 0818 112/74 - - 84 20 - - -  12/26/17 0813 118/62 - - 89 - - - -  12/26/17 0807 112/68 - - 86 - - - -  12/26/17 0802 122/65 - - 77 - - - -  12/26/17 0753 131/70 - - 79 - - - -  12/26/17 0737 (!) 113/57 - - 81 20 - - -  12/26/17 0536 - 98.4 F (36.9 C) Axillary - - - - -  12/26/17 0513 129/69 - - 78 16 - - -  12/26/17 0350 - (!) 97.5 F (36.4 C) Oral - - - - -  12/26/17  0150 116/67 97.8 F (36.6 C) Oral 87 17 - - -  12/25/17 2340 113/64 97.6 F (36.4 C) Oral 73 18 - - -  12/25/17 2043 131/70 98.3 F (36.8 C) Oral 84 16 - 5\' 4"  (1.626 m) 111.9 kg    U/s performed with formal u/s (better quality of imaging) and endometrium clear and not thickend   Patient fatigued but alert and oriented Normal respirations Abd: soft, but fundus firm and 2cm; continued fundal massage and continued to be firm over 1 hour and no further bleeding bme performed after about 45 min after second hemorrhage episode and continued firmness and  no further bleeding  A/P  Pod 0 s/p svd with pph 1. Stable and no bleeding in >1 hr; continued good tone and good tone LUS, normal appearing endometrium; s/p 2units blood, h/h stable after transfusion, s/p cytotec,txa, methergine x1 dose, 40mu pitocin; plan repeat cbc in 6 hrs and again in am Hemorrhage, management, and current stable status all reviewed with patient and husband  Please also see notes by Dr Adrian Blackwater and Carlean Jews, CNM

## 2017-12-26 NOTE — Progress Notes (Signed)
Called by CNM that patient with retained placenta On way to evaluate and spoke with CNM again - bleeding had been stable but then she began to have heavy bleeding; plan to call faculty staff to assist until my arrival and manage hemorrhage  Arrived on unit and Dr Elroy Channel informed me that after quick uterine massage/traction the placenta removed and no manual removal; removed intact, spontaneously  Arrived in room and pt w/o bleeding Fundus firm and 2cm below umb No bleeding with massage  Pt stable; cbc pending pph but stable now, total ebl ; follow closely

## 2017-12-26 NOTE — Progress Notes (Signed)
Pt back in room after walking in hallways

## 2017-12-27 LAB — CBC
HEMATOCRIT: 30 % — AB (ref 36.0–46.0)
HEMOGLOBIN: 10 g/dL — AB (ref 12.0–15.0)
MCH: 28.2 pg (ref 26.0–34.0)
MCHC: 33.3 g/dL (ref 30.0–36.0)
MCV: 84.7 fL (ref 80.0–100.0)
Platelets: 159 10*3/uL (ref 150–400)
RBC: 3.54 MIL/uL — AB (ref 3.87–5.11)
RDW: 14.9 % (ref 11.5–15.5)
WBC: 9.8 10*3/uL (ref 4.0–10.5)
nRBC: 0 % (ref 0.0–0.2)

## 2017-12-27 NOTE — Progress Notes (Signed)
MOB was referred for history of depression/anxiety. * Referral screened out by Clinical Social Worker because none of the following criteria appear to apply: ~ History of anxiety/depression during this pregnancy, or of post-partum depression following prior delivery. Per chart review, no concerns of anxiety during pregnancy.  ~ Diagnosis of anxiety and/or depression within last 3 years OR * MOB's symptoms currently being treated with medication and/or therapy. Please contact the Clinical Social Worker if needs arise, by [facility] request, or if MOB scores greater than 9/yes to question 10 on Edinburgh Postpartum Depression Screen.  Celso Sickle, LCSWA Clinical Social Worker [facility] Cell#: [phone]

## 2017-12-27 NOTE — Progress Notes (Signed)
PPD #1, SVD with postpartum hemorrhage, baby boy "Turner Daniels"  S:  Reports feeling much better today; reports she does not remember much about after the delivery. We reviewed events that happened.  She expressed gratitude and appreciation for all staff involved that helped her.               Tolerating po/ No nausea or vomiting / Denies dizziness; occasional SOB on exertion and feels generalized weakness              Bleeding is light, no clots             Pain controlled with Motrin             Up ad lib / ambulatory / voiding QS  Newborn breast feeding with some formula supplementation, but plans to exclusively breastfeed and pump when her milk comes in  / Circumcision - planning outpatient   O:               VS: BP 114/68 (BP Location: Left Arm)   Pulse 79   Temp 98.5 F (36.9 C) (Oral)   Resp 18   Ht 5\' 4"  (1.626 m)   Wt 111.9 kg   LMP 03/13/2017   SpO2 100%   Breastfeeding? Unknown   BMI 42.33 kg/m    Patient Position (if appropriate)  SpO2  O2 Device  Weight Who             12/27/17 0500  98.5 F (36.9 C)  79  -  -  114/68  Semi-fowlers  -  -  - MH   12/26/17 2348  98.1 F (36.7 C)  85  -  18  115/73  Semi-fowlers  -  -  - MH   12/26/17 2049  98.1 F (36.7 C)  80  -  -  118/70  Semi-fowlers  -  -  - MH   12/26/17 1704  98.2 F (36.8 C)  81  -  18  119/71  Semi-fowlers  100 %  -  - CW   12/26/17 1300  98.3 F (36.8 C)  75  -  18  119/70                LABS:              Recent Labs    12/26/17 1346 12/27/17 0523  WBC 13.5* 9.8  HGB 11.7* 10.0*  PLT 169 159               Blood type: --/--/AB POS (11/11 2020)  Rubella: Immune (03/29 0000)                     I&O: Intake/Output      11/12 0701 - 11/13 0700 11/13 0701 - 11/14 0700   I.V. (mL/kg) 200 (1.8)    Blood 315    Total Intake(mL/kg) 515 (4.6)    Urine (mL/kg/hr) 3450 (1.3) 650 (1.8)   Blood 2207    Total Output 5657 650   Net -5142 -650                      Physical Exam:             Alert and  oriented X3  Lungs: Clear and unlabored  Heart: regular rate and rhythm / no murmurs  Abdomen: soft, non-tender, non-distended, umbilical hernia noted  Fundus: firm, mild tenderness, U-1  Perineum: intact, no edema  Lochia: small, no clots   Extremities: trace pedal edema, no calf pain or tenderness    A/P: PPD # 1, SVD  S/p Postpartum hemorrhage with ABL Anemia and hypovolemia    - Improved and stable    -  s/p 2units blood, h/h stable after transfusion, s/p cytotec,txa, methergine x1 dose, 40mu pitocin yesterday    - Continued Methergine 0.2mg  every 4 hrs x 6 doses - now complete   - Labs and VS stable    - Continue oral FE   - Repeat CBC in AM  Doing well - stable status  Routine post partum orders  Abdominal binder  Apply TED hose   Will likely be ready for discharge home tomorrow if continues to be stable  Carlean Jews, MSN, CNM [facility]

## 2017-12-28 LAB — BPAM RBC
BLOOD PRODUCT EXPIRATION DATE: 201911242359
BLOOD PRODUCT EXPIRATION DATE: 201911282359
BLOOD PRODUCT EXPIRATION DATE: 201911282359
Blood Product Expiration Date: 201911242359
ISSUE DATE / TIME: 201911120908
ISSUE DATE / TIME: 201911120908
UNIT TYPE AND RH: 600
UNIT TYPE AND RH: 600
UNIT TYPE AND RH: 6200
Unit Type and Rh: 6200

## 2017-12-28 LAB — TYPE AND SCREEN
ABO/RH(D): AB POS
Antibody Screen: NEGATIVE
UNIT DIVISION: 0
Unit division: 0
Unit division: 0
Unit division: 0

## 2017-12-28 LAB — CBC
HCT: 30.1 % — ABNORMAL LOW (ref 36.0–46.0)
HEMOGLOBIN: 10.1 g/dL — AB (ref 12.0–15.0)
MCH: 28.9 pg (ref 26.0–34.0)
MCHC: 33.6 g/dL (ref 30.0–36.0)
MCV: 86 fL (ref 80.0–100.0)
NRBC: 0 % (ref 0.0–0.2)
PLATELETS: 174 10*3/uL (ref 150–400)
RBC: 3.5 MIL/uL — AB (ref 3.87–5.11)
RDW: 15.2 % (ref 11.5–15.5)
WBC: 7.8 10*3/uL (ref 4.0–10.5)

## 2017-12-28 MED ORDER — FERROUS SULFATE 325 (65 FE) MG PO TABS: 325.0000 mg | ORAL_TABLET | Freq: Two times a day (BID) | ORAL | 3 refills | Status: DC

## 2017-12-28 MED ORDER — IBUPROFEN 600 MG PO TABS: 600.0000 mg | ORAL_TABLET | Freq: Four times a day (QID) | ORAL | 2 refills | Status: DC

## 2017-12-28 NOTE — Lactation Note (Signed)
This note was copied from a baby's chart. Lactation Consultation Note  Patient Name: Michelle Pacheco [id number] Date: 12/28/2017   Visited with P3 Mom on day of discharge, baby 86 hrs old.  Baby is at 3% weight loss.  Baby is breastfeeding and Mom is supplementing with formula.  DEBP set up and Mom encouraged to pump both breasts 15-20 mins every time baby is supplemented.   Mom offered an OP lactation follow-up appointment.  Mom declined and said she would call if needed. Engorgement prevention and treatment discussed. Mom aware of OP lactation support available.  Encouraged STS and feeding baby often when he cues he is hungry.  Mom to pump both breasts when baby is supplemented.   [name] 12/28/2017, 11:58 AM

## 2017-12-28 NOTE — Progress Notes (Signed)
PPD #2, SVD with postpartum hemorrhage, baby boy "Turner Daniels"  S:  Reports feeling much better             Tolerating po/ No nausea or vomiting / Denies dizziness; occasional SOB on exertion and feels generalized weakness              Bleeding is light, no clots             Pain controlled with Motrin             Up ad lib / ambulatory / voiding QS  Newborn breast feeding with some formula supplementation, but plans to exclusively breastfeed and pump when her milk comes in  / Circumcision - planning outpatient   O:               VS: BP 118/70   Pulse 79   Temp 98.3 F (36.8 C) (Oral)   Resp 15   Ht 5\' 4"  (1.626 m)   Wt 111.9 kg   LMP 03/13/2017   SpO2 100%   Breastfeeding? Unknown   BMI 42.33 kg/m    Patient Position (if appropriate)  SpO2  O2 Device  Weight Who             12/27/17 0500  98.5 F (36.9 C)  79  -  -  114/68  Semi-fowlers  -  -  - MH   12/26/17 2348  98.1 F (36.7 C)  85  -  18  115/73  Semi-fowlers  -  -  - MH   12/26/17 2049  98.1 F (36.7 C)  80  -  -  118/70  Semi-fowlers  -  -  - MH   12/26/17 1704  98.2 F (36.8 C)  81  -  18  119/71  Semi-fowlers  100 %  -  - CW   12/26/17 1300  98.3 F (36.8 C)  75  -  18  119/70                LABS:              Recent Labs    12/27/17 0523 12/28/17 0600  WBC 9.8 7.8  HGB 10.0* 10.1*  PLT 159 174               Blood type: --/--/AB POS (11/11 2020)  Rubella: Immune (03/29 0000)                     I&O: Intake/Output      11/13 0701 - 11/14 0700 11/14 0701 - 11/15 0700   I.V. (mL/kg)     Blood     Total Intake(mL/kg)     Urine (mL/kg/hr) 1050 (0.4)    Blood     Total Output 1050    Net -1050                       Physical Exam:             Alert and oriented X3  Lungs: Clear and unlabored  Heart: regular rate and rhythm / no murmurs  Abdomen: soft, non-tender, non-distended, umbilical hernia noted             Fundus: firm, mild tenderness, U-1  Perineum: intact, no edema  Lochia: small, no clots    Extremities: trace pedal edema, no calf pain or tenderness    A/P: PPD # 2 SVD  S/p Postpartum  hemorrhage with ABL Anemia and hypovolemia    - Improved and stable    -  s/p 2units blood, h/h stable after transfusion, s/p cytotec,txa, methergine x1 dose, 40mu pitocin yesterday    - Continued Methergine 0.2mg  every 4 hrs x 6 doses - now complete   - Labs and VS stable    - Continue oral FE  Doing well - stable status  Routine post partum discharge

## 2018-01-02 NOTE — Discharge Summary (Addendum)
Obstetric Discharge Summary   Patient Name: Michelle Pacheco DOB: 26-Jun-1985 MRN: [id number]  Date of Admission: 12/25/2017 Date of Discharge: 12/28/2017 Date of Delivery: 12/26/17 Gestational Age at Delivery:   Primary OB: [facility] - Dr. Cherly Hensen   Antepartum complications:  - Anemia - Hx. Of PPH with blood transfusion - GBS positive  Prenatal Labs:  ABO, Rh: --/--/AB POS (11/11 2020) Antibody: NEG (11/11 2020) Rubella: Immune (03/29 0000) RPR: Nonreactive (03/29 0000)  HBsAg: Negative (03/29 0000)  HIV: Non-reactive (03/29 0000)  GBS: Positive (10/01 0000)  Admitting Diagnosis: IOL for postdates   Secondary Diagnoses: Patient Active Problem List   Diagnosis Date Noted  . SVD (spontaneous vaginal delivery) 12/26/2017  . Postpartum care following vaginal delivery (11/12) 12/26/2017  . Postpartum hemorrhage, delivered 12/26/2017  . Acute blood loss anemia 12/26/2017  . Post-dates pregnancy 12/25/2017    Augmentation: AROM Complications: Hemorrhage>108mL  Date of Delivery: 12/26/17 Delivered By: Carlean Jews, CNM  Delivery Type: spontaneous vaginal delivery Anesthesia: none Placenta: sponatneous Laceration: none Episiotomy: none  Newborn Data: Live born female  Birth Weight: 6 lb 13.7 oz (3110 g) APGAR: 8, 9  Newborn Delivery   Birth date/time:  12/26/2017 07:03:00 Delivery type:  Vaginal, Spontaneous        Hospital/Postpartum Course  (Vaginal Delivery): Pt. Admitted in labor. See notes for details. Patient's postpartum course was complicated by retained placenta with spontaneous and manual removal resulting in postpartum hemorrhage requiring 2units blood, h/h stable after transfusion, s/p cytotec,txa, methergine x1 dose, 40mu pitocin. See notes for further details. [name], her pain was controlled on oral pain medications; she had appropriate lochia and was ambulating, voiding without difficulty and tolerating regular diet.   She was deemed stable for discharge to home.    Labs: CBC Latest Ref Rng & Units 12/28/2017 12/27/2017 12/26/2017  WBC 4.0 - 10.5 K/uL 7.8 9.8 13.5(H)  Hemoglobin 12.0 - 15.0 g/dL 10.1(L) 10.0(L) 11.7(L)  Hematocrit 36.0 - 46.0 % 30.1(L) 30.0(L) 35.0(L)  Platelets 150 - 400 K/uL 174 159 169   AB POS  Physical exam:  BP 118/70   Pulse 79   Temp 98.3 F (36.8 C) (Oral)   Resp 15   Ht 5\' 4"  (1.626 m)   Wt 111.9 kg   LMP 03/13/2017   SpO2 100%   Breastfeeding? Unknown   BMI 42.33 kg/m  General: alert and no distress Pulm: normal respiratory effort Lochia: appropriate Abdomen: soft, NT Uterine Fundus: firm, below umbilicus Perineum: healing well, no significant erythema, no significant edema Extremities: No evidence of DVT seen on physical exam. No lower extremity edema.   Disposition: stable, discharge to home Baby Feeding: breast milk Baby Disposition: home with mom  Contraception: not discussed   Rh Immune globulin given: N/A Rubella vaccine given: N/A Tdap vaccine given in AP or PP setting:  Flu vaccine given in AP or PP setting:    Plan:  Michelle Pacheco was discharged to home in good condition. Follow-up appointment at [facility] in 6 weeks.  Discharge Instructions: Per After Visit Summary. Refer to After Visit Summary and [facility] discharge booklet  Activity: Advance as tolerated. Pelvic rest for 6 weeks.   Diet: Regular, Heart Healthy Discharge Medications: Allergies as of 12/28/2017      Reactions   Peanuts [peanut Oil] Swelling   Citrus Hives   Latex Hives      Medication List    STOP taking these medications   cetirizine 10 MG  tablet Commonly known as:  ZYRTEC   CITRANATAL 90 DHA 90-1 & 300 MG Misc     TAKE these medications   ferrous sulfate 325 (65 FE) MG tablet Take 1 tablet (325 mg total) by mouth 2 (two) times daily with a meal.   ibuprofen 600 MG tablet Commonly known as:  ADVIL,MOTRIN Take 1 tablet (600 mg total) by  mouth every 6 (six) hours.      Outpatient follow up:   Signed:  Carlean Jews, MSN, CNM [facility]

## 2018-01-29 DIAGNOSIS — Z3483 Encounter for supervision of other normal pregnancy, third trimester: Secondary | ICD-10-CM | POA: Diagnosis not present

## 2018-01-29 DIAGNOSIS — Z3482 Encounter for supervision of other normal pregnancy, second trimester: Secondary | ICD-10-CM | POA: Diagnosis not present

## 2018-01-29 MED FILL — CITRANATAL 90 DHA COMBO PAC: 90-1 & 300 | 30 days supply | Qty: 60 | Fill #4

## 2018-02-05 DIAGNOSIS — Z130 Encounter for screening for diseases of the blood and blood-forming organs and certain disorders involving the immune mechanism: Secondary | ICD-10-CM | POA: Diagnosis not present

## 2018-02-05 DIAGNOSIS — Z1151 Encounter for screening for human papillomavirus (HPV): Secondary | ICD-10-CM | POA: Diagnosis not present

## 2018-03-13 MED FILL — CITRANATAL 90 DHA COMBO PAC: 90-1 & 300 | 30 days supply | Qty: 60 | Fill #5

## 2018-04-25 ENCOUNTER — Encounter: Payer: 59 | Attending: Obstetrics and Gynecology | Admitting: Registered"

## 2018-04-25 ENCOUNTER — Encounter: Payer: Self-pay | Admitting: Registered"

## 2018-04-25 ENCOUNTER — Other Ambulatory Visit: Payer: Self-pay

## 2018-04-25 VITALS — Ht 63.5 in | Wt 227.5 lb

## 2018-04-25 DIAGNOSIS — Z713 Dietary counseling and surveillance: Secondary | ICD-10-CM | POA: Diagnosis not present

## 2018-04-25 MED FILL — CITRANATAL 90 DHA COMBO PAC: 90-1 & 300 | 30 days supply | Qty: 60 | Fill #6

## 2018-04-25 NOTE — Patient Instructions (Addendum)
Before going to the grocery store create a list to help you stick to buying the foods you are after. As long as you are drinking mint tea, plantation mint has spearmint, may help with PCOS symptoms  Keep working on drinking water may help with milk production as well as keeping your bowels regular.  Aim to eat at least 15 g protein for breakfast and dinner and some for lunch. Bring you latest labs with you to your next visit

## 2018-04-25 NOTE — Progress Notes (Signed)
Medical Nutrition Therapy:  Appt start time: 1455 end time:  1540.   Assessment:  Primary concerns today: PCOS and alopecia. Low breast milk supply.   Pt states she is still very concerned about her hair falling out. Pt states she cannot take metformin due to side effects and is nervous about taking inositol while breastfeeding.  Pt states she wants to make sure she is eating enough carbs and protein for milk production. Pt states her son was born Nov 12 and would like to breastfeed as long as possible. Pt reports she has started supplementing feedings.  Pt states pregnancy went okay until delivery when she hemorrhaged requiring blood transfusions. Pt states she had dizzy spells for 2 weeks after transfusions, has since resolved. Pt states breastmilk production was delayed 11 days. Pt states she wants to eat enough to help her milk production but was concerned that she wasn't losing more weight. Pt also has questions about keto and Intermittent fasting.  RD followed-up visit sending information via email per patient's request: "I found the website below that has some legit information and I like that their resources and references are easy to identify. In addition to giving tips about milk supply and explaining why PCOS can affect it, it appears that inositol is "very low risk" but advised to use with caution I believe because there is not enough information on how much actually passes through the breastmilk. https://breastfeeding.support/polycystic-ovary-syndrome-breastfeeding/ I have reached out to a couple of other dietitians who are more specialized in the area of lactation."   Pt states her OBGYN, Dr. Cherly Hensen, is not familiar with inositol. RD will send Dr. Cherly Hensen research references/abstracts for use of inositol in PCOS.  Pt states she has gotten off track eating on a schedule and may just snack some during the day and have one big meal at night. Pt states she is eating less red meat and  after she ate a burger did not feel well for a couple of days and snacked on chips.  Pt states she has a hard time when grocery shopping and buys some things such as soda because it is on sale, but then ends up giving some things away.  Pt states her husband is the one who cooks for the family and lately just mainly eating [place], fish and chicken, big on pasta - chicken, broccoli alfredo, ground [place] spaghetti. Pt states they try to sneak vegetables in so their kids will eat more veggies.   Preferred Learning Style:  No preference indicated   Learning Readiness:   Ready   MEDICATIONS: reviewed   DIETARY INTAKE:  Avoided foods include nuts, citrus, avocado - loves these foods and sometime will eat some in small quantities, but states her doctor states she is playing with fire and to make sure she has an epi-pen.  24-hr recall:  B ( AM): 8 oz coffee, creamer  Snk ( AM): chips  L ( PM): snacking Snk ( PM):  D ( PM): crockpot [place] burger spaghetti Snank ( PM):  Beverages: ~64 oz water, coffee, soda occassionally  Usual physical activity:   Estimated energy needs: 2000 calories  Progress Towards Goal(s):  In progress.   Nutritional Diagnosis:  Inadequate fluid intake As related to increased needs during lactation.  As evidenced by dietary recall less than recommended wated intake and low breastmilk supply.    Intervention:  Nutrition Education. Importance of getting adequate nutrition and fluid, extra needed while lactating. Discussed inositol role in PCOS. Discussed anti-inflammatory  foods.  Plan: Before going to the grocery store create a list to help you stick to buying the foods you are after. As long as you are drinking mint tea, plantation mint has spearmint, may help with PCOS symptoms  Keep working on drinking water may help with milk production as well as keeping your bowels regular.  Aim to eat at least 15 g protein for breakfast and dinner and some for  lunch. Bring you latest labs with you to your next visit  Teaching Method Utilized:  Visual Auditory  Handouts given during visit include:  MyPlate Planner  Inositol for PCOS  PCOS Tips  PCOS resources  Barriers to learning/adherence to lifestyle change: none  Demonstrated degree of understanding via:  Teach Back   Monitoring/Evaluation:  Dietary intake, exercise, PCOS symptoms, and body weight in 2 month(s).

## 2018-04-26 DIAGNOSIS — Z713 Dietary counseling and surveillance: Secondary | ICD-10-CM | POA: Insufficient documentation

## 2018-06-15 ENCOUNTER — Other Ambulatory Visit: Payer: Self-pay

## 2018-06-15 DIAGNOSIS — Z299 Encounter for prophylactic measures, unspecified: Secondary | ICD-10-CM

## 2018-06-15 DIAGNOSIS — Z13220 Encounter for screening for lipoid disorders: Secondary | ICD-10-CM

## 2018-06-15 DIAGNOSIS — Z0000 Encounter for general adult medical examination without abnormal findings: Secondary | ICD-10-CM

## 2018-06-15 DIAGNOSIS — Z1329 Encounter for screening for other suspected endocrine disorder: Secondary | ICD-10-CM

## 2018-06-15 MED FILL — CITRANATAL 90 DHA COMBO PAC: 90-1 & 300 | 30 days supply | Qty: 60 | Fill #0

## 2018-06-19 ENCOUNTER — Other Ambulatory Visit: Payer: Self-pay

## 2018-06-19 ENCOUNTER — Ambulatory Visit (INDEPENDENT_AMBULATORY_CARE_PROVIDER_SITE_OTHER): Payer: 59 | Admitting: Family Medicine

## 2018-06-19 DIAGNOSIS — Z13220 Encounter for screening for lipoid disorders: Secondary | ICD-10-CM | POA: Diagnosis not present

## 2018-06-19 DIAGNOSIS — Z0000 Encounter for general adult medical examination without abnormal findings: Secondary | ICD-10-CM

## 2018-06-19 DIAGNOSIS — Z299 Encounter for prophylactic measures, unspecified: Secondary | ICD-10-CM | POA: Diagnosis not present

## 2018-06-19 DIAGNOSIS — Z1329 Encounter for screening for other suspected endocrine disorder: Secondary | ICD-10-CM | POA: Diagnosis not present

## 2018-06-19 LAB — POCT URINALYSIS DIP (MANUAL ENTRY)
Bilirubin, UA: NEGATIVE
Blood, UA: NEGATIVE
Glucose, UA: NEGATIVE mg/dL
Ketones, POC UA: NEGATIVE mg/dL
Leukocytes, UA: NEGATIVE
Nitrite, UA: NEGATIVE
Protein Ur, POC: NEGATIVE mg/dL
Spec Grav, UA: 1.025 (ref 1.010–1.025)
Urobilinogen, UA: 0.2 E.U./dL
pH, UA: 6.5 (ref 5.0–8.0)

## 2018-06-19 NOTE — Patient Instructions (Signed)
° ° ° °  If you have lab work done today you will be contacted with your lab results within the next 2 weeks.  If you have not heard from us then please contact us. The fastest way to get your results is to register for My Chart. ° ° °IF you received an x-ray today, you will receive an invoice from [facility]. Please contact [facility] at [phone] with questions or concerns regarding your invoice.  ° °IF you received labwork today, you will receive an invoice from [facility]. Please contact [facility] at [phone] with questions or concerns regarding your invoice.  ° °Our billing staff will not be able to assist you with questions regarding bills from these companies. ° °You will be contacted with the lab results as soon as they are available. The fastest way to get your results is to activate your My Chart account. Instructions are located on the last page of this paperwork. If you have not heard from us regarding the results in 2 weeks, please contact this office. °  ° ° ° °

## 2018-06-20 LAB — COMPREHENSIVE METABOLIC PANEL
ALT: 17 IU/L (ref 0–32)
AST: 19 IU/L (ref 0–40)
Albumin/Globulin Ratio: 1.6 (ref 1.2–2.2)
Albumin: 4.5 g/dL (ref 3.8–4.8)
Alkaline Phosphatase: 80 IU/L (ref 39–117)
BUN/Creatinine Ratio: 14 (ref 9–23)
BUN: 13 mg/dL (ref 6–20)
Bilirubin Total: 0.2 mg/dL (ref 0.0–1.2)
CO2: 20 mmol/L (ref 20–29)
Calcium: 9.6 mg/dL (ref 8.7–10.2)
Chloride: 103 mmol/L (ref 96–106)
Creatinine, Ser: 0.9 mg/dL (ref 0.57–1.00)
GFR calc Af Amer: 96 mL/min/{1.73_m2} (ref >59)
GFR calc non Af Amer: 84 mL/min/{1.73_m2} (ref >59)
Globulin, Total: 2.9 g/dL (ref 1.5–4.5)
Glucose: 86 mg/dL (ref 65–99)
Potassium: 4.3 mmol/L (ref 3.5–5.2)
Sodium: 140 mmol/L (ref 134–144)
Total Protein: 7.4 g/dL (ref 6.0–8.5)

## 2018-06-20 LAB — LIPID PANEL
Chol/HDL Ratio: 3 ratio (ref 0.0–4.4)
Cholesterol, Total: 182 mg/dL (ref 100–199)
HDL: 60 mg/dL (ref >39)
LDL Calculated: 107 mg/dL — ABNORMAL HIGH (ref 0–99)
Triglycerides: 77 mg/dL (ref 0–149)
VLDL Cholesterol Cal: 15 mg/dL (ref 5–40)

## 2018-06-20 LAB — TSH: TSH: 1.9 u[IU]/mL (ref 0.450–4.500)

## 2018-06-21 ENCOUNTER — Encounter: Payer: Self-pay | Admitting: Family Medicine

## 2018-06-21 ENCOUNTER — Ambulatory Visit (INDEPENDENT_AMBULATORY_CARE_PROVIDER_SITE_OTHER): Payer: 59 | Admitting: Family Medicine

## 2018-06-21 ENCOUNTER — Other Ambulatory Visit: Payer: Self-pay

## 2018-06-21 VITALS — BP 122/82 | HR 86 | Temp 98.8°F | Resp 17 | Ht 63.5 in | Wt 233.0 lb

## 2018-06-21 DIAGNOSIS — M6208 Separation of muscle (nontraumatic), other site: Secondary | ICD-10-CM

## 2018-06-21 DIAGNOSIS — D500 Iron deficiency anemia secondary to blood loss (chronic): Secondary | ICD-10-CM

## 2018-06-21 DIAGNOSIS — Z0000 Encounter for general adult medical examination without abnormal findings: Secondary | ICD-10-CM

## 2018-06-21 DIAGNOSIS — E282 Polycystic ovarian syndrome: Secondary | ICD-10-CM

## 2018-06-21 DIAGNOSIS — Z0001 Encounter for general adult medical examination with abnormal findings: Secondary | ICD-10-CM

## 2018-06-21 LAB — POCT CBC
Granulocyte percent: 60.6 %G (ref 37–80)
HCT, POC: 27.8 % — AB (ref 29–41)
Hemoglobin: 12.4 g/dL (ref 11–14.6)
Lymph, poc: 1.7 (ref 0.6–3.4)
MCH, POC: 26.9 pg — AB (ref 27–31.2)
MCHC: 32.8 g/dL (ref 31.8–35.4)
MCV: 81.8 fL (ref 76–111)
MID (cbc): 0.2 (ref 0–0.9)
MPV: 7.8 fL (ref 0–99.8)
POC Granulocyte: 2.8 (ref 2–6.9)
POC LYMPH PERCENT: 35.3 %L (ref 10–50)
POC MID %: 4.2 %M (ref 0–12)
Platelet Count, POC: 333 10*3/uL (ref 142–424)
RBC: 4.61 M/uL (ref 4.04–5.48)
RDW, POC: 15 %
WBC: 4.7 10*3/uL (ref 4.6–10.2)

## 2018-06-21 LAB — POCT GLYCOSYLATED HEMOGLOBIN (HGB A1C): Hemoglobin A1C: 5.2 % (ref 4.0–5.6)

## 2018-06-21 NOTE — Progress Notes (Signed)
Chief Complaint  Patient presents with   Annual Exam    CPE    Subjective:  Michelle Pacheco is a 32 y.o. female here for a health maintenance visit.  Patient is established pt  She is 6 months postpartum She had severe anemia due to [facility] She states that now she is with a nutritionist  She is breastfeeding and still on iron  She did not have GDM in her pregnancy   Patient Active Problem List   Diagnosis Date Noted   Nutritional counseling 04/26/2018   SVD (spontaneous vaginal delivery) 12/26/2017   Postpartum care following vaginal delivery (11/12) 12/26/2017   Postpartum hemorrhage, delivered 12/26/2017   Acute blood loss anemia 12/26/2017   Post-dates pregnancy 12/25/2017   PCOS (polycystic ovarian syndrome) 11/21/2013   Family history of diabetes mellitus 11/21/2013   Other allergy, other than to medicinal agents 05/30/2013    Past Medical History:  Diagnosis Date   Anemia    Anxiety    Blood transfusion without reported diagnosis    Medical history non-contributory     Past Surgical History:  Procedure Laterality Date   DILATION AND CURETTAGE OF UTERUS     WISDOM TOOTH EXTRACTION       Outpatient Medications Prior to Visit  Medication Sig Dispense Refill   Cetirizine HCl (ZYRTEC PO) Take by mouth.     Prenatal Vit-Fe Fumarate-FA (PRENATAL VITAMIN PO) Take by mouth.     ferrous sulfate 325 (65 FE) MG tablet Take 1 tablet (325 mg total) by mouth 2 (two) times daily with a meal. 30 tablet 3   ibuprofen (ADVIL,MOTRIN) 600 MG tablet Take 1 tablet (600 mg total) by mouth every 6 (six) hours. (Patient not taking: Reported on 06/21/2018) 30 tablet 2   Prenat w/o A-FeCbGl-DSS-FA-DHA (CITRANATAL 90 DHA) 90-1 & 300 MG MISC      No facility-administered medications prior to visit.     Allergies  Allergen Reactions   Bioflavonoids Hives   Peanuts [Peanut Oil] Swelling   Azithromycin Diarrhea   Citrus Hives   Latex Hives   Shellfish  Allergy      Family History  Problem Relation Age of Onset   Multiple sclerosis Mother    Diabetes Father    Asthma Brother    Diabetes Maternal Grandfather    Hypertension Maternal Grandfather    Prostate cancer Maternal Grandfather      Health Habits: Dental Exam: up to date Eye Exam: up to date   Social History   Socioeconomic History   Marital status: Married    Spouse name: Not on file   Number of children: 2   Years of education: Not on file   Highest education level: Not on file  Occupational History   Occupation: REGISTRATION    Employer: [employer]  Social Designer, fashion/clothing strain: Not hard at all   Food insecurity    Worry: Never true    Inability: Never true   Transportation needs    Medical: No    Non-medical: No  Tobacco Use   Smoking status: Former Smoker    Types: Cigarettes    Quit date: 09/07/2011    Years since quitting: 6.9   Smokeless tobacco: Never Used  Substance and Sexual Activity   Alcohol use: Yes    Comment: rarely   Drug use: No   Sexual activity: Yes    Partners: Male  Lifestyle   Physical activity    Days per week: 2  days    Minutes per session: 30 min   Stress: Not at all  Relationships   Social connections    Talks on phone: Three times a week    Gets together: Three times a week    Attends religious service: More than 4 times per year    Active member of club or organization: Yes    Attends meetings of clubs or organizations: More than 4 times per year    Relationship status: Married   Intimate partner violence    Fear of current or ex partner: No    Emotionally abused: No    Physically abused: No    Forced sexual activity: No  Other Topics Concern   Not on file  Social History Narrative   Not on file   Social History   Substance and Sexual Activity  Alcohol Use Yes   Comment: rarely   Social History   Tobacco Use  Smoking Status Former Smoker    Types: Cigarettes   Quit date: 09/07/2011   Years since quitting: 6.9  Smokeless Tobacco Never Used   Social History   Substance and Sexual Activity  Drug Use No    GYN: Sexual Health Menstrual status: regular menses LMP: Patient's last menstrual period was 06/13/2018 (approximate). Last pap smear: see HM section History of abnormal pap smears:  Sexually active: with female partner Current contraception: condoms  Health Maintenance: See under health Maintenance activity for review of completion dates as well. There is no immunization history for the selected administration types on file for this patient.    Depression Screen-PHQ2/9 Depression screen [facility] 2/9 06/21/2018 04/25/2018 07/27/2017  Decreased Interest 0 0 0  Down, Depressed, Hopeless 0 0 0  PHQ - 2 Score 0 0 0     Depression Severity and Treatment Recommendations:  0-4= None  5-9= Mild / Treatment: Support, educate to call if worse; return in one month  10-14= Moderate / Treatment: Support, watchful waiting; Antidepressant or Psycotherapy  15-19= Moderately severe / Treatment: Antidepressant OR Psychotherapy  >= 20 = Major depression, severe / Antidepressant AND Psychotherapy    Review of Systems   ROS  See HPI for ROS as well.   Review of Systems  Constitutional: Negative for activity change, appetite change, chills and fever.  HENT: Negative for congestion, nosebleeds, trouble swallowing and voice change.   Respiratory: Negative for cough, shortness of breath and wheezing.   Gastrointestinal: Negative for diarrhea, nausea and vomiting.  Genitourinary: Negative for difficulty urinating, dysuria, flank pain and hematuria.  Musculoskeletal: Negative for back pain, joint swelling and neck pain.  Neurological: Negative for dizziness, speech difficulty, light-headedness and numbness.  See HPI. All other review of systems negative.     Objective:   Vitals:   06/21/18 0812  BP: 122/82  Pulse: 86  Resp: 17   Temp: 98.8 F (37.1 C)  TempSrc: Oral  SpO2: 100%  Weight: 233 lb (105.7 kg)  Height: 5' 3.5" (1.613 m)    Body mass index is 40.63 kg/m.  Physical Exam Constitutional:      Appearance: Normal appearance.  HENT:     Head: Normocephalic and atraumatic.     Nose: Nose normal.     Mouth/Throat:     Mouth: Mucous membranes are moist.  Eyes:     Extraocular Movements: Extraocular movements intact.     Conjunctiva/sclera: Conjunctivae normal.  Cardiovascular:     Rate and Rhythm: Normal rate and regular rhythm.     Heart sounds:  No murmur.  Pulmonary:     Effort: Pulmonary effort is normal. No respiratory distress.     Breath sounds: Normal breath sounds. No wheezing.  Abdominal:     General: Abdomen is flat. Bowel sounds are normal.     Palpations: Abdomen is soft. There is no mass.     Comments: Palpable defect in the mid abdomen at the levels of the umbilicus  Skin:    General: Skin is warm.     Coloration: Skin is not jaundiced.     Findings: No bruising.  Neurological:     General: No focal deficit present.     Mental Status: She is alert and oriented to person, place, and time.  Psychiatric:        Mood and Affect: Mood normal.        Behavior: Behavior normal.        Thought Content: Thought content normal.        Judgment: Judgment normal.        Assessment/Plan:   Patient was seen for a health maintenance exam.  Counseled the patient on health maintenance issues. Reviewed her health mainteance schedule and ordered appropriate tests (see orders.) Counseled on regular exercise and weight management. Recommend regular eye exams and dental cleaning.   The following issues were addressed today for health maintenance:   Alezandra was seen today for annual exam.  Diagnoses and all orders for this visit:  Routine health maintenance- Women's Health Maintenance Plan Advised monthly breast exam and annual mammogram Advised dental exam every six  months Discussed stress management Discussed pap smear screening guidelines  Expressed how important exercise is to prevent diabetes and to also improve mood  Iron deficiency anemia due to chronic blood loss - hemoglobin is good  -     POCT CBC  PCOS (polycystic ovarian syndrome)- will screen for diabetes -     POCT glycosylated hemoglobin (Hb A1C)  Diastasis of rectus abdominis- advised pt to see physical therapy for evaluation -     Ambulatory referral to Physical Therapy    Return in about 6 months (around 12/22/2018) for obesity and PCOS.    Body mass index is 40.63 kg/m.:  Discussed the patient's BMI with patient. The BMI body mass index is 40.63 kg/m.     Future Appointments  Date Time Provider [place]  08/07/2018  2:30 PM Aliene Altes [facility]  08/28/2018  2:00 PM Christella Hartigan, RD [facility]  12/28/2018  8:40 AM Forrest Moron, MD [facility]    Patient Instructions       If you have lab work done today you will be contacted with your lab results within the next 2 weeks.  If you have not heard from [place] then please contact us. The fastest way to get your results is to register for My Chart.   IF you received an x-ray today, you will receive an invoice from [facility]. Please contact [facility] at [phone] with questions or concerns regarding your invoice.   IF you received labwork today, you will receive an invoice from [facility]. Please contact [facility] at [phone] with questions or concerns regarding your invoice.   Our billing staff will not be able to assist you with questions regarding bills from these companies.  You will be contacted with the lab results as soon as they are available. The fastest way to get your results is to activate your My Chart account. Instructions are located on  the last page of this paperwork. If you have not heard from [place] regarding the results in 2 weeks, please contact this  office.    Health Maintenance, Female Adopting a healthy lifestyle and getting preventive care can go a long way to promote health and wellness. Talk with your health care provider about what schedule of regular examinations is right for you. This is a good chance for you to check in with your provider about disease prevention and staying healthy. In between checkups, there are plenty of things you can do on your own. Experts have done a lot of research about which lifestyle changes and preventive measures are most likely to keep you healthy. Ask your health care provider for more information. Weight and diet Eat a healthy diet  Be sure to include plenty of vegetables, fruits, low-fat dairy products, and lean protein.  Do not eat a lot of foods high in solid fats, added sugars, or salt.  Get regular exercise. This is one of the most important things you can do for your health. ? Most adults should exercise for at least 150 minutes each week. The exercise should increase your heart rate and make you sweat (moderate-intensity exercise). ? Most adults should also do strengthening exercises at least twice a week. This is in addition to the moderate-intensity exercise. Maintain a healthy weight  Body mass index (BMI) is a measurement that can be used to identify possible weight problems. It estimates body fat based on height and weight. Your health care provider can help determine your BMI and help you achieve or maintain a healthy weight.  For females 2 years of age and older: ? A BMI below 18.5 is considered underweight. ? A BMI of 18.5 to 24.9 is normal. ? A BMI of 25 to 29.9 is considered overweight. ? A BMI of 30 and above is considered obese. Watch levels of cholesterol and blood lipids  You should start having your blood tested for lipids and cholesterol at 35 years of age, then have this test every 5 years.  You may need to have your cholesterol levels checked more often if: ? Your  lipid or cholesterol levels are high. ? You are older than 35 years of age. ? You are at high risk for heart disease. Cancer screening Lung Cancer  Lung cancer screening is recommended for adults 80-8 years old who are at high risk for lung cancer because of a history of smoking.  A yearly low-dose CT scan of the lungs is recommended for people who: ? Currently smoke. ? Have quit within the past 15 years. ? Have at least a 30-pack-year history of smoking. A pack year is smoking an average of one pack of cigarettes a day for 1 year.  Yearly screening should continue until it has been 15 years since you quit.  Yearly screening should stop if you develop a health problem that would prevent you from having lung cancer treatment. Breast Cancer  Practice breast self-awareness. This means understanding how your breasts normally appear and feel.  It also means doing regular breast self-exams. Let your health care provider know about any changes, no matter how small.  If you are in your 20s or 30s, you should have a clinical breast exam (CBE) by a health care provider every 1-3 years as part of a regular health exam.  If you are 66 or older, have a CBE every year. Also consider having a breast X-ray (mammogram) every year.  If you  have a family history of breast cancer, talk to your health care provider about genetic screening.  If you are at high risk for breast cancer, talk to your health care provider about having an MRI and a mammogram every year.  Breast cancer gene (BRCA) assessment is recommended for women who have family members with BRCA-related cancers. BRCA-related cancers include: ? Breast. ? Ovarian. ? Tubal. ? Peritoneal cancers.  Results of the assessment will determine the need for genetic counseling and BRCA1 and BRCA2 testing. Cervical Cancer Your health care provider may recommend that you be screened regularly for cancer of the pelvic organs (ovaries, uterus, and  vagina). This screening involves a pelvic examination, including checking for microscopic changes to the surface of your cervix (Pap test). You may be encouraged to have this screening done every 3 years, beginning at age 61.  For women ages 67-65, health care providers may recommend pelvic exams and Pap testing every 3 years, or they may recommend the Pap and pelvic exam, combined with testing for human papilloma virus (HPV), every 5 years. Some types of HPV increase your risk of cervical cancer. Testing for HPV may also be done on women of any age with unclear Pap test results.  Other health care providers may not recommend any screening for nonpregnant women who are considered low risk for pelvic cancer and who do not have symptoms. Ask your health care provider if a screening pelvic exam is right for you.  If you have had past treatment for cervical cancer or a condition that could lead to cancer, you need Pap tests and screening for cancer for at least 20 years after your treatment. If Pap tests have been discontinued, your risk factors (such as having a new sexual partner) need to be reassessed to determine if screening should resume. Some women have medical problems that increase the chance of getting cervical cancer. In these cases, your health care provider may recommend more frequent screening and Pap tests. Colorectal Cancer  This type of cancer can be detected and often prevented.  Routine colorectal cancer screening usually begins at 35 years of age and continues through 35 years of age.  Your health care provider may recommend screening at an earlier age if you have risk factors for colon cancer.  Your health care provider may also recommend using home test kits to check for hidden blood in the stool.  A small camera at the end of a tube can be used to examine your colon directly (sigmoidoscopy or colonoscopy). This is done to check for the earliest forms of colorectal  cancer.  Routine screening usually begins at age 59.  Direct examination of the colon should be repeated every 5-10 years through 35 years of age. However, you may need to be screened more often if early forms of precancerous polyps or small growths are found. Skin Cancer  Check your skin from head to toe regularly.  Tell your health care provider about any new moles or changes in moles, especially if there is a change in a mole's shape or color.  Also tell your health care provider if you have a mole that is larger than the size of a pencil eraser.  Always use sunscreen. Apply sunscreen liberally and repeatedly throughout the day.  Protect yourself by wearing long sleeves, pants, a wide-brimmed hat, and sunglasses whenever you are outside. Heart disease, diabetes, and high blood pressure  High blood pressure causes heart disease and increases the risk of stroke. High  blood pressure is more likely to develop in: ? People who have blood pressure in the high end of the normal range (130-139/85-89 mm Hg). ? People who are overweight or obese. ? People who are African American.  If you are 27-59 years of age, have your blood pressure checked every 3-5 years. If you are 37 years of age or older, have your blood pressure checked every year. You should have your blood pressure measured twice--once when you are at a hospital or clinic, and once when you are not at a hospital or clinic. Record the average of the two measurements. To check your blood pressure when you are not at a hospital or clinic, you can use: ? An automated blood pressure machine at a pharmacy. ? A home blood pressure monitor.  If you are between 15 years and 30 years old, ask your health care provider if you should take aspirin to prevent strokes.  Have regular diabetes screenings. This involves taking a blood sample to check your fasting blood sugar level. ? If you are at a normal weight and have a low risk for diabetes,  have this test once every three years after 35 years of age. ? If you are overweight and have a high risk for diabetes, consider being tested at a younger age or more often. Preventing infection Hepatitis B  If you have a higher risk for hepatitis B, you should be screened for this virus. You are considered at high risk for hepatitis B if: ? You were born in a country where hepatitis B is common. Ask your health care provider which countries are considered high risk. ? Your parents were born in a high-risk country, and you have not been immunized against hepatitis B (hepatitis B vaccine). ? You have HIV or AIDS. ? You use needles to inject street drugs. ? You live with someone who has hepatitis B. ? You have had sex with someone who has hepatitis B. ? You get hemodialysis treatment. ? You take certain medicines for conditions, including cancer, organ transplantation, and autoimmune conditions. Hepatitis C  Blood testing is recommended for: ? Everyone born from [birth year] through 1967. ? Anyone with known risk factors for hepatitis C. Sexually transmitted infections (STIs)  You should be screened for sexually transmitted infections (STIs) including gonorrhea and chlamydia if: ? You are sexually active and are younger than 35 years of age. ? You are older than 35 years of age and your health care provider tells you that you are at risk for this type of infection. ? Your sexual activity has changed since you were last screened and you are at an increased risk for chlamydia or gonorrhea. Ask your health care provider if you are at risk.  If you do not have HIV, but are at risk, it may be recommended that you take a prescription medicine daily to prevent HIV infection. This is called pre-exposure prophylaxis (PrEP). You are considered at risk if: ? You are sexually active and do not regularly use condoms or know the HIV status of your partner(s). ? You take drugs by injection. ? You are sexually  active with a partner who has HIV. Talk with your health care provider about whether you are at high risk of being infected with HIV. If you choose to begin PrEP, you should first be tested for HIV. You should then be tested every 3 months for as long as you are taking PrEP. Pregnancy  If you are premenopausal and you  may become pregnant, ask your health care provider about preconception counseling.  If you may become pregnant, take 400 to 800 micrograms (mcg) of folic acid every day.  If you want to prevent pregnancy, talk to your health care provider about birth control (contraception). Osteoporosis and menopause  Osteoporosis is a disease in which the bones lose minerals and strength with aging. This can result in serious bone fractures. Your risk for osteoporosis can be identified using a bone density scan.  If you are 28 years of age or older, or if you are at risk for osteoporosis and fractures, ask your health care provider if you should be screened.  Ask your health care provider whether you should take a calcium or vitamin D supplement to lower your risk for osteoporosis.  Menopause may have certain physical symptoms and risks.  Hormone replacement therapy may reduce some of these symptoms and risks. Talk to your health care provider about whether hormone replacement therapy is right for you. Follow these instructions at home:  Schedule regular health, dental, and eye exams.  Stay current with your immunizations.  Do not use any tobacco products including cigarettes, chewing tobacco, or electronic cigarettes.  If you are pregnant, do not drink alcohol.  If you are breastfeeding, limit how much and how often you drink alcohol.  Limit alcohol intake to no more than 1 drink per day for nonpregnant women. One drink equals 12 ounces of beer, 5 ounces of wine, or 1 ounces of hard liquor.  Do not use street drugs.  Do not share needles.  Ask your health care provider for  help if you need support or information about quitting drugs.  Tell your health care provider if you often feel depressed.  Tell your health care provider if you have ever been abused or do not feel safe at home. This information is not intended to replace advice given to you by your health care provider. Make sure you discuss any questions you have with your health care provider. Document Released: 08/16/2010 Document Revised: 07/09/2015 Document Reviewed: 11/04/2014 Elsevier Interactive Patient Education  2019 [place]  Diastasis recti is when the muscles of the abdomen (rectus abdominis muscles) become thin and separate. The result is a wider space between the right and left abdomen (abdominal) muscles. This wider space between the muscles may cause a bulge in the middle of your abdomen. You may notice this bulge when you are straining or when you sit up from a lying down position. Diastasis recti can affect men and women. It is most common among pregnant women, infants, people who are obese, and people who have had abdominal surgery. Exercise or surgical treatment may help correct it. What are the causes? Common causes of this condition include:  Pregnancy. The growing uterus puts pressure on the abdominal muscles, which causes the muscles to separate.  Obesity. Excess fat puts pressure on abdominal muscles.  Weightlifting.  Some abdomen exercises.  Advanced age.  Genetics.  Prior abdominal surgery. What increases the risk? This condition is more likely to develop in:  Women.  Newborns, especially newborns who are born early (prematurely). What are the signs or symptoms? Common symptoms of this condition include:  A bulge in the middle of the abdomen. You will notice it most when you sit up or strain.  Pain in the low back, pelvis, or hips.  Constipation.  Inability to control when you urinate (urinary incontinence).  Bloating.  Poor  posture. How is  this diagnosed? This condition is diagnosed with a physical exam. Your health care provider will ask you to lie flat on your back and do a crunch or half sit-up. If you have diastasis recti, a vertical bulge will appear between your abdominal muscles in the center of your abdomen. Your health care provider will measure the gap between your muscles with one of the following:  A medical device used to measure the space between two objects (caliper).  A tape measure.  CT scan.  Ultrasound.  Finger spaces. Your health care provider will measure the space using their fingers. How is this treated? If your muscle separation is not too large, you may not need treatment. However, if you are a woman who plans to become pregnant again, you should treat this condition before your next pregnancy. Treatment may include:  Physical therapy to strengthen and tighten your abdominal muscles.  Lifestyle changes such as weight loss and exercise.  Over-the-counter pain medicines as needed.  Surgery to correct the separation. Follow these instructions at home: Activity  Return to your normal activities as told by your health care provider. Ask your health care provider what activities are safe for you.  When lifting weights or doing exercises using your abdominal muscles or the muscles in the center of your body that give stability (core muscles), make sure you are doing your exercises and movements correctly. Proper form can help to prevent the condition from happening again. General instructions  If you are overweight, ask your health care provider for help with weight loss. Losing even a small amount of weight can help to improve your diastasis recti.  Take over-the-counter or prescription medicines only as told by your health care provider.  Do not strain. Straining can make the separation worse. Examples of straining include: ? Pushing hard to have a bowel movement, such as due to  constipation. ? Lifting heavy objects, including children. ? Standing up and sitting down.  Take steps to prevent constipation: ? Drink enough fluid to keep your urine clear or pale yellow. ? Take over-the-counter or prescription medicines only as directed. ? Eat foods that are high in fiber, such as fresh fruits and vegetables, whole grains, and beans. ? Limit foods that are high in fat and processed sugars, such as fried and sweet foods. Contact a health care provider if:  You notice a new bulge in your abdomen. Get help right away if:  You experience severe discomfort in your abdomen.  You develop severe abdominal pain along with nausea, vomiting, or fever. Summary  Diastasis recti is when the abdomen (abdominal) muscles become thin and separate. Your abdomen will stick out because the space between your right and left abdomen muscles has widened.  The most common symptom is a bulge in your abdomen. You will notice it most when you sit up or are straining.  This condition is diagnosed during a physical exam.  If the abdomen separation is not too big, you may choose not to have treatment. Otherwise, you may need to undergo physical therapy or surgery. This information is not intended to replace advice given to you by your health care provider. Make sure you discuss any questions you have with your health care provider. Document Released: 03/28/2016 Document Revised: 03/28/2016 Document Reviewed: 03/28/2016 Elsevier Interactive Patient Education  [employer].

## 2018-06-21 NOTE — Patient Instructions (Addendum)
If you have lab work done today you will be contacted with your lab results within the next 2 weeks.  If you have not heard from [place] then please contact us. The fastest way to get your results is to register for My Chart.   IF you received an x-ray today, you will receive an invoice from [facility]. Please contact [facility] at [phone] with questions or concerns regarding your invoice.   IF you received labwork today, you will receive an invoice from [place]. Please contact [facility] at [phone] with questions or concerns regarding your invoice.   Our billing staff will not be able to assist you with questions regarding bills from these companies.  You will be contacted with the lab results as soon as they are available. The fastest way to get your results is to activate your My Chart account. Instructions are located on the last page of this paperwork. If you have not heard from [place] regarding the results in 2 weeks, please contact this office.    Health Maintenance, Female Adopting a healthy lifestyle and getting preventive care can go a long way to promote health and wellness. Talk with your health care provider about what schedule of regular examinations is right for you. This is a good chance for you to check in with your provider about disease prevention and staying healthy. In between checkups, there are plenty of things you can do on your own. Experts have done a lot of research about which lifestyle changes and preventive measures are most likely to keep you healthy. Ask your health care provider for more information. Weight and diet Eat a healthy diet  Be sure to include plenty of vegetables, fruits, low-fat dairy products, and lean protein.  Do not eat a lot of foods high in solid fats, added sugars, or salt.  Get regular exercise. This is one of the most important things you can do for your health. ? Most adults should exercise for at least 150  minutes each week. The exercise should increase your heart rate and make you sweat (moderate-intensity exercise). ? Most adults should also do strengthening exercises at least twice a week. This is in addition to the moderate-intensity exercise. Maintain a healthy weight  Body mass index (BMI) is a measurement that can be used to identify possible weight problems. It estimates body fat based on height and weight. Your health care provider can help determine your BMI and help you achieve or maintain a healthy weight.  For females 12 years of age and older: ? A BMI below 18.5 is considered underweight. ? A BMI of 18.5 to 24.9 is normal. ? A BMI of 25 to 29.9 is considered overweight. ? A BMI of 30 and above is considered obese. Watch levels of cholesterol and blood lipids  You should start having your blood tested for lipids and cholesterol at 35 years of age, then have this test every 5 years.  You may need to have your cholesterol levels checked more often if: ? Your lipid or cholesterol levels are high. ? You are older than 35 years of age. ? You are at high risk for heart disease. Cancer screening Lung Cancer  Lung cancer screening is recommended for adults 58-36 years old who are at high risk for lung cancer because of a history of smoking.  A yearly low-dose CT scan of the lungs is recommended for people who: ? Currently smoke. ? Have quit within the past 15  years. ? Have at least a 30-pack-year history of smoking. A pack year is smoking an average of one pack of cigarettes a day for 1 year.  Yearly screening should continue until it has been 15 years since you quit.  Yearly screening should stop if you develop a health problem that would prevent you from having lung cancer treatment. Breast Cancer  Practice breast self-awareness. This means understanding how your breasts normally appear and feel.  It also means doing regular breast self-exams. Let your health care provider  know about any changes, no matter how small.  If you are in your 20s or 30s, you should have a clinical breast exam (CBE) by a health care provider every 1-3 years as part of a regular health exam.  If you are 3 or older, have a CBE every year. Also consider having a breast X-ray (mammogram) every year.  If you have a family history of breast cancer, talk to your health care provider about genetic screening.  If you are at high risk for breast cancer, talk to your health care provider about having an MRI and a mammogram every year.  Breast cancer gene (BRCA) assessment is recommended for women who have family members with BRCA-related cancers. BRCA-related cancers include: ? Breast. ? Ovarian. ? Tubal. ? Peritoneal cancers.  Results of the assessment will determine the need for genetic counseling and BRCA1 and BRCA2 testing. Cervical Cancer Your health care provider may recommend that you be screened regularly for cancer of the pelvic organs (ovaries, uterus, and vagina). This screening involves a pelvic examination, including checking for microscopic changes to the surface of your cervix (Pap test). You may be encouraged to have this screening done every 3 years, beginning at age 63.  For women ages 71-65, health care providers may recommend pelvic exams and Pap testing every 3 years, or they may recommend the Pap and pelvic exam, combined with testing for human papilloma virus (HPV), every 5 years. Some types of HPV increase your risk of cervical cancer. Testing for HPV may also be done on women of any age with unclear Pap test results.  Other health care providers may not recommend any screening for nonpregnant women who are considered low risk for pelvic cancer and who do not have symptoms. Ask your health care provider if a screening pelvic exam is right for you.  If you have had past treatment for cervical cancer or a condition that could lead to cancer, you need Pap tests and  screening for cancer for at least 20 years after your treatment. If Pap tests have been discontinued, your risk factors (such as having a new sexual partner) need to be reassessed to determine if screening should resume. Some women have medical problems that increase the chance of getting cervical cancer. In these cases, your health care provider may recommend more frequent screening and Pap tests. Colorectal Cancer  This type of cancer can be detected and often prevented.  Routine colorectal cancer screening usually begins at 35 years of age and continues through 35 years of age.  Your health care provider may recommend screening at an earlier age if you have risk factors for colon cancer.  Your health care provider may also recommend using home test kits to check for hidden blood in the stool.  A small camera at the end of a tube can be used to examine your colon directly (sigmoidoscopy or colonoscopy). This is done to check for the earliest forms of colorectal cancer.  Routine screening usually begins at age 50.  Direct examination of the colon should be repeated every 5-10 years through 35 years of age. However, you may need to be screened more often if early forms of precancerous polyps or small growths are found. Skin Cancer  Check your skin from head to toe regularly.  Tell your health care provider about any new moles or changes in moles, especially if there is a change in a mole's shape or color.  Also tell your health care provider if you have a mole that is larger than the size of a pencil eraser.  Always use sunscreen. Apply sunscreen liberally and repeatedly throughout the day.  Protect yourself by wearing long sleeves, pants, a wide-brimmed hat, and sunglasses whenever you are outside. Heart disease, diabetes, and high blood pressure  High blood pressure causes heart disease and increases the risk of stroke. High blood pressure is more likely to develop in: ? People who  have blood pressure in the high end of the normal range (130-139/85-89 mm Hg). ? People who are overweight or obese. ? People who are African American.  If you are 18-39 years of age, have your blood pressure checked every 3-5 years. If you are 40 years of age or older, have your blood pressure checked every year. You should have your blood pressure measured twice-once when you are at a hospital or clinic, and once when you are not at a hospital or clinic. Record the average of the two measurements. To check your blood pressure when you are not at a hospital or clinic, you can use: ? An automated blood pressure machine at a pharmacy. ? A home blood pressure monitor.  If you are between 55 years and 79 years old, ask your health care provider if you should take aspirin to prevent strokes.  Have regular diabetes screenings. This involves taking a blood sample to check your fasting blood sugar level. ? If you are at a normal weight and have a low risk for diabetes, have this test once every three years after 35 years of age. ? If you are overweight and have a high risk for diabetes, consider being tested at a younger age or more often. Preventing infection Hepatitis B  If you have a higher risk for hepatitis B, you should be screened for this virus. You are considered at high risk for hepatitis B if: ? You were born in a country where hepatitis B is common. Ask your health care provider which countries are considered high risk. ? Your parents were born in a high-risk country, and you have not been immunized against hepatitis B (hepatitis B vaccine). ? You have HIV or AIDS. ? You use needles to inject street drugs. ? You live with someone who has hepatitis B. ? You have had sex with someone who has hepatitis B. ? You get hemodialysis treatment. ? You take certain medicines for conditions, including cancer, organ transplantation, and autoimmune conditions. Hepatitis C  Blood testing is  recommended for: ? Everyone born from 1945 through 1965. ? Anyone with known risk factors for hepatitis C. Sexually transmitted infections (STIs)  You should be screened for sexually transmitted infections (STIs) including gonorrhea and chlamydia if: ? You are sexually active and are younger than 35 years of age. ? You are older than 35 years of age and your health care provider tells you that you are at risk for this type of infection. ? Your sexual activity has changed since you   were last screened and you are at an increased risk for chlamydia or gonorrhea. Ask your health care provider if you are at risk.  If you do not have HIV, but are at risk, it may be recommended that you take a prescription medicine daily to prevent HIV infection. This is called pre-exposure prophylaxis (PrEP). You are considered at risk if: ? You are sexually active and do not regularly use condoms or know the HIV status of your partner(s). ? You take drugs by injection. ? You are sexually active with a partner who has HIV. Talk with your health care provider about whether you are at high risk of being infected with HIV. If you choose to begin PrEP, you should first be tested for HIV. You should then be tested every 3 months for as long as you are taking PrEP. Pregnancy  If you are premenopausal and you may become pregnant, ask your health care provider about preconception counseling.  If you may become pregnant, take 400 to 800 micrograms (mcg) of folic acid every day.  If you want to prevent pregnancy, talk to your health care provider about birth control (contraception). Osteoporosis and menopause  Osteoporosis is a disease in which the bones lose minerals and strength with aging. This can result in serious bone fractures. Your risk for osteoporosis can be identified using a bone density scan.  If you are 37 years of age or older, or if you are at risk for osteoporosis and fractures, ask your health care  provider if you should be screened.  Ask your health care provider whether you should take a calcium or vitamin D supplement to lower your risk for osteoporosis.  Menopause may have certain physical symptoms and risks.  Hormone replacement therapy may reduce some of these symptoms and risks. Talk to your health care provider about whether hormone replacement therapy is right for you. Follow these instructions at home:  Schedule regular health, dental, and eye exams.  Stay current with your immunizations.  Do not use any tobacco products including cigarettes, chewing tobacco, or electronic cigarettes.  If you are pregnant, do not drink alcohol.  If you are breastfeeding, limit how much and how often you drink alcohol.  Limit alcohol intake to no more than 1 drink per day for nonpregnant women. One drink equals 12 ounces of beer, 5 ounces of wine, or 1 ounces of hard liquor.  Do not use street drugs.  Do not share needles.  Ask your health care provider for help if you need support or information about quitting drugs.  Tell your health care provider if you often feel depressed.  Tell your health care provider if you have ever been abused or do not feel safe at home. This information is not intended to replace advice given to you by your health care provider. Make sure you discuss any questions you have with your health care provider. Document Released: 08/16/2010 Document Revised: 07/09/2015 Document Reviewed: 11/04/2014 Elsevier Interactive Patient Education  2019 [employer]  Diastasis recti is when the muscles of the abdomen (rectus abdominis muscles) become thin and separate. The result is a wider space between the right and left abdomen (abdominal) muscles. This wider space between the muscles may cause a bulge in the middle of your abdomen. You may notice this bulge when you are straining or when you sit up from a lying down position. Diastasis recti can  affect men and women. It is most common among pregnant women,  infants, people who are obese, and people who have had abdominal surgery. Exercise or surgical treatment may help correct it. What are the causes? Common causes of this condition include:  Pregnancy. The growing uterus puts pressure on the abdominal muscles, which causes the muscles to separate.  Obesity. Excess fat puts pressure on abdominal muscles.  Weightlifting.  Some abdomen exercises.  Advanced age.  Genetics.  Prior abdominal surgery. What increases the risk? This condition is more likely to develop in:  Women.  Newborns, especially newborns who are born early (prematurely). What are the signs or symptoms? Common symptoms of this condition include:  A bulge in the middle of the abdomen. You will notice it most when you sit up or strain.  Pain in the low back, pelvis, or hips.  Constipation.  Inability to control when you urinate (urinary incontinence).  Bloating.  Poor posture. How is this diagnosed? This condition is diagnosed with a physical exam. Your health care provider will ask you to lie flat on your back and do a crunch or half sit-up. If you have diastasis recti, a vertical bulge will appear between your abdominal muscles in the center of your abdomen. Your health care provider will measure the gap between your muscles with one of the following:  A medical device used to measure the space between two objects (caliper).  A tape measure.  CT scan.  Ultrasound.  Finger spaces. Your health care provider will measure the space using their fingers. How is this treated? If your muscle separation is not too large, you may not need treatment. However, if you are a woman who plans to become pregnant again, you should treat this condition before your next pregnancy. Treatment may include:  Physical therapy to strengthen and tighten your abdominal muscles.  Lifestyle changes such as weight loss  and exercise.  Over-the-counter pain medicines as needed.  Surgery to correct the separation. Follow these instructions at home: Activity  Return to your normal activities as told by your health care provider. Ask your health care provider what activities are safe for you.  When lifting weights or doing exercises using your abdominal muscles or the muscles in the center of your body that give stability (core muscles), make sure you are doing your exercises and movements correctly. Proper form can help to prevent the condition from happening again. General instructions  If you are overweight, ask your health care provider for help with weight loss. Losing even a small amount of weight can help to improve your diastasis recti.  Take over-the-counter or prescription medicines only as told by your health care provider.  Do not strain. Straining can make the separation worse. Examples of straining include: ? Pushing hard to have a bowel movement, such as due to constipation. ? Lifting heavy objects, including children. ? Standing up and sitting down.  Take steps to prevent constipation: ? Drink enough fluid to keep your urine clear or pale yellow. ? Take over-the-counter or prescription medicines only as directed. ? Eat foods that are high in fiber, such as fresh fruits and vegetables, whole grains, and beans. ? Limit foods that are high in fat and processed sugars, such as fried and sweet foods. Contact a health care provider if:  You notice a new bulge in your abdomen. Get help right away if:  You experience severe discomfort in your abdomen.  You develop severe abdominal pain along with nausea, vomiting, or fever. Summary  Diastasis recti is when the abdomen (abdominal) muscles  become thin and separate. Your abdomen will stick out because the space between your right and left abdomen muscles has widened.  The most common symptom is a bulge in your abdomen. You will notice it most  when you sit up or are straining.  This condition is diagnosed during a physical exam.  If the abdomen separation is not too big, you may choose not to have treatment. Otherwise, you may need to undergo physical therapy or surgery. This information is not intended to replace advice given to you by your health care provider. Make sure you discuss any questions you have with your health care provider. Document Released: 03/28/2016 Document Revised: 03/28/2016 Document Reviewed: 03/28/2016 Elsevier Interactive Patient Education  [employer].

## 2018-06-23 ENCOUNTER — Encounter: Payer: Self-pay | Admitting: Family Medicine

## 2018-07-03 ENCOUNTER — Ambulatory Visit: Payer: 59 | Attending: Family Medicine | Admitting: Physical Therapy

## 2018-07-03 ENCOUNTER — Encounter: Payer: Self-pay | Admitting: Physical Therapy

## 2018-07-03 ENCOUNTER — Other Ambulatory Visit: Payer: Self-pay

## 2018-07-03 DIAGNOSIS — M6208 Separation of muscle (nontraumatic), other site: Secondary | ICD-10-CM | POA: Insufficient documentation

## 2018-07-03 DIAGNOSIS — M542 Cervicalgia: Secondary | ICD-10-CM | POA: Diagnosis not present

## 2018-07-03 DIAGNOSIS — M6281 Muscle weakness (generalized): Secondary | ICD-10-CM | POA: Insufficient documentation

## 2018-07-03 DIAGNOSIS — R293 Abnormal posture: Secondary | ICD-10-CM | POA: Diagnosis not present

## 2018-07-03 DIAGNOSIS — M62838 Other muscle spasm: Secondary | ICD-10-CM | POA: Diagnosis not present

## 2018-07-03 NOTE — Patient Instructions (Signed)
Step 1  Step 2  Supine Transversus Abdominis Bracing - Hands on Stomach reps: 10  sets: 2  hold: 10  daily: 3  weekly: 7 Then take a look at the video [employer] for other info   Setup  Begin lying on your back with your knees bent, feet resting on the floor, and your fingers resting on your stomach just above your hip bones. Movement  Tighten your abdominals, pulling your navel in toward your spine and up. You should feel your muscles contract under your fingers. Hold this position, then relax and repeat. Tip  Make sure to keep your back flat against the floor and do not hold your breath as you tighten your muscles.

## 2018-07-04 NOTE — Therapy (Signed)
[facility] Outpatient Rehabilitation [facility] [address] [place], [place], [address] Phone: [phone]   Fax:  [phone]  Physical Therapy Evaluation  Patient Details  Name: Michelle Pacheco MRN: [id number] Date of Birth: [birth year] Referring Provider (PT): Dr. Collie Siad   Encounter Date: 07/03/2018  PT End of Session - 07/03/18 1517    Visit Number  1    Number of Visits  8    Date for PT Re-Evaluation  08/28/18    PT Start Time  1420    PT Stop Time  1505    PT Time Calculation (min)  45 min    Activity Tolerance  Patient tolerated treatment well    Behavior During Therapy  [facility] for tasks assessed/performed       Past Medical History:  Diagnosis Date  . Anemia   . Anxiety   . Blood transfusion without reported diagnosis   . Medical history non-contributory     Past Surgical History:  Procedure Laterality Date  . DILATION AND CURETTAGE OF UTERUS    . WISDOM TOOTH EXTRACTION      There were no vitals filed for this visit.   Subjective Assessment - 07/03/18 1428    Subjective  Pt presents with diastasis recti and abdominal discomfort.   This problem has been ongoing even 2 yrs prior to her getting pregnant.  She has had 3 vaginal births, youngest son is 54 mos old. She saw GI who told her she had an umbilical hernia prior to seeing Dr.  Creta Levin.  The protrusion varies in size depending on what she eats. Has some tenderness in umbilicus and can be bad if "flared up" by eating fried foods or overactivity.   She admits to waiting to have this looked at but it is beginning to interfere more and more with daily life.     Limitations  Other (comment);Lifting;Standing;Walking   interact with infant , discomfort with tight clothes   Diagnostic tests  none , asks about an abdominal US, but has not had this     Patient Stated Goals  Be able to fell normal in clothes, hold son without pain, get more fit    Currently in Pain?  Yes    Pain Score  1     Pain Location  Abdomen    Pain Orientation  Anterior    Pain Descriptors / Indicators  Stabbing    Pain Type  Post Delivery (post partum);Chronic pain    Pain Onset  More than a month ago    Pain Frequency  Intermittent    Aggravating Factors   fried foods do makes it flare up , lifting, carrying, when tried to do some ab exercise    Pain Relieving Factors  rest     Effect of Pain on Daily Activities  worried, painful, has to make changes to get stronger     Multiple Pain Sites  No         [facility] PT Assessment - 07/04/18 0001      Assessment   Medical Diagnosis  diastasis recti    Referring Provider (PT)  Dr. Collie Siad    Onset Date/Surgical Date  --   Nov. 2019 son was born, but this is chronic>3 yrs   Prior Therapy  Yes for her neck       Precautions   Precautions  None      Balance Screen   Has the patient fallen in the past 6 months  No  Home Environment   Living Environment  Private residence    Living Arrangements  Spouse/significant other;Children      Prior Function   Level of Independence  Independent with basic ADLs    Vocation  Full time employment    Vocation Requirements  [employer]     Leisure  new regimen is walking, scavenger hunts      Cognition   Overall Cognitive Status  Within Functional Limits for tasks assessed      Observation/Other Assessments   Focus on Therapeutic Outcomes (FOTO)   NT      Sensation   Light Touch  Appears Intact      Functional Tests   Functional tests  Squat;Single leg stance      Squat   Comments  good form       Single Leg Stance   Comments  < 10 sec       Posture/Postural Control   Posture/Postural Control  Postural limitations    Postural Limitations  Forward head;Anterior pelvic tilt    Posture Comments  weakness evident in abdominal wall       AROM   Overall AROM Comments  flex and ext WNL min lumbar pain with extension       Strength   Overall Strength Comments  WNL in LE,did not test glutes, lateral  hip      Palpation   Palpation comment  at least 2 finger width and depth separation in abdominal wall, also visualized pooching out of umbilicus even without head lift                 Objective measurements completed on examination: See above findings.      [facility] Adult PT Treatment/Exercise - 07/04/18 0001      Self-Care   Self-Care  Posture;Other Self-Care Comments    Posture  standing posture, breathing, rib position     Other Self-Care Comments   website for self care of diastasis , POC, HEP              PT Education - 07/03/18 1520    Education Details  core, HEP, see flowsheet     Person(s) Educated  Patient    Methods  Explanation;Demonstration;Tactile cues;Verbal cues;Handout    Comprehension  Verbalized understanding;Returned demonstration;Verbal cues required;Need further instruction       PT Short Term Goals - 07/04/18 0715      PT SHORT TERM GOAL #1   Title  Pt will understand diastasis recti and how to properly activate deep core muscles    Time  4    Period  Weeks    Status  New    Target Date  07/31/18      PT SHORT TERM GOAL #2   Title  Pt will be able to maintain core contraction with gentle stabilization exercises effectively     Time  4    Period  Weeks    Status  New    Target Date  07/31/18        PT Long Term Goals - 07/04/18 0717      PT LONG TERM GOAL #1   Title  Pt will be able to report no increased pain with walking, normal daily activities in the home (not lifting)     Time  8    Period  Weeks    Status  New    Target Date  08/28/18      PT LONG TERM GOAL #2  Title  Pt will understand and demo good standing posture, lifting and body mechanics for reductig strain on the abdominal wall     Time  8    Period  Weeks    Status  New    Target Date  08/28/18      PT LONG TERM GOAL #3   Title  Pt will be able to exercise, play with kids regularly and safely, without increased pain in abdominals     Time  8    Period   Weeks    Status  New    Target Date  08/28/18             Plan - 07/04/18 0703    Clinical Impression Statement  This patient presents with signs and symptoms of diastasis recti.  She has noticed a change in appearance and increased pain depending on the foods she eats, which leads me to think she has co-existing diagnoses.  She will benefit from PT to improve rib closure, educate on safe abdominal exercises and strengthen her core for optimal function.     Personal Factors and Comorbidities  Comorbidity 1;Time since onset of injury/illness/exacerbation    Comorbidities  multiple pregnancies with chronic issue of diastasis    Examination-Activity Limitations  Bed Mobility;Lift;Carry;Caring for Others;Sleep    Examination-Participation Restrictions  Interpersonal Relationship    Stability/Clinical Decision Making  Evolving/Moderate complexity    Clinical Decision Making  Moderate    Rehab Potential  Excellent    PT Frequency  1x / week    PT Duration  8 weeks    PT Treatment/Interventions  ADLs/Self Care Home Management;Therapeutic exercise;Therapeutic activities;Functional mobility training;Neuromuscular re-education;Manual techniques;Taping;Patient/family education    PT Next Visit Plan  develop core HEP conider Pilates Reformer for full body , bracing/tape research    PT Home Exercise Plan  isometric Tr A with breathing for abdominal wall    Consulted and Agree with Plan of Care  Patient       Patient will benefit from skilled therapeutic intervention in order to improve the following deficits and impairments:  Pain, Postural dysfunction, Decreased strength, Obesity, Decreased balance, Decreased skin integrity  Visit Diagnosis: Diastasis recti  Muscle weakness (generalized)  Abnormal posture     Problem List Patient Active Problem List   Diagnosis Date Noted  . Nutritional counseling 04/26/2018  . SVD (spontaneous vaginal delivery) 12/26/2017  . Postpartum care  following vaginal delivery (11/12) 12/26/2017  . Postpartum hemorrhage, delivered 12/26/2017  . Acute blood loss anemia 12/26/2017  . Post-dates pregnancy 12/25/2017  . PCOS (polycystic ovarian syndrome) 11/21/2013  . Family history of diabetes mellitus 11/21/2013  . Other allergy, other than to medicinal agents 05/30/2013    Pacheco,JENNIFER 07/04/2018, 7:21 AM  [facility] [address] [place], [place], [address] Phone: [phone]   Fax:  [phone]  Name: Michelle Pacheco MRN: [id number] Date of Birth: 05/07/1985   Michelle Pacheco, PT 07/04/18 7:21 AM Phone: [phone] Fax: [phone]

## 2018-07-12 ENCOUNTER — Ambulatory Visit: Payer: 59 | Admitting: Physical Therapy

## 2018-07-12 ENCOUNTER — Encounter: Payer: Self-pay | Admitting: Physical Therapy

## 2018-07-12 DIAGNOSIS — M6208 Separation of muscle (nontraumatic), other site: Secondary | ICD-10-CM

## 2018-07-12 DIAGNOSIS — R293 Abnormal posture: Secondary | ICD-10-CM | POA: Diagnosis not present

## 2018-07-12 DIAGNOSIS — M62838 Other muscle spasm: Secondary | ICD-10-CM

## 2018-07-12 DIAGNOSIS — M542 Cervicalgia: Secondary | ICD-10-CM | POA: Diagnosis not present

## 2018-07-12 DIAGNOSIS — M6281 Muscle weakness (generalized): Secondary | ICD-10-CM | POA: Diagnosis not present

## 2018-07-12 NOTE — Patient Instructions (Signed)
  Access Code: [email or web address]  URL: [email or web address]  Date: 07/12/2018  Prepared by: [name]   Exercises  Supine Transversus Abdominis Bracing - Hands on Stomach - 10 reps - 2 sets - 5 hold - 2x daily - 7x weekly  Supine Hip Adduction Isometric with Ball - 10 reps - 2 sets - 5 hold - 2x daily - 7x weekly  Bent Knee Fallouts - 10 reps - 2 sets - 5 hold - 2x daily - 7x weekly  Clamshell - 10 reps - 2 sets - 5 hold - 2x daily - 7x weekly

## 2018-07-12 NOTE — Therapy (Addendum)
[facility] Outpatient Rehabilitation [facility] [address] [place], [place], [address] Phone: [phone]   Fax:  [phone]  Physical Therapy Treatment  Patient Details  Name: Michelle Pacheco MRN: [id number] Date of Birth: 1985/09/12 Referring Provider (PT): Dr. Collie Siad   Physical Therapy Telehealth Visit:  I connected with Baruch Gouty today at 1500 by Webex video conference and verified that I am speaking with the correct person using two identifiers.  I discussed the limitations, risks, security and privacy concerns of performing an evaluation and management service by Webex and the availability of in person appointments.  I also discussed with the patient that there may be a patient responsible charge related to this service. The patient expressed understanding and agreed to proceed.    The patient's address was confirmed.  Identified to the patient that therapist is a licensed PT in the state of [place].  Verified phone # as to call in case of technical difficulties. [phone]   Encounter Date: 07/12/2018  PT End of Session - 07/12/18 1503    Visit Number  2    Number of Visits  8    Date for PT Re-Evaluation  08/28/18    PT Start Time  1505    PT Stop Time  1540    PT Time Calculation (min)  35 min    Activity Tolerance  Patient tolerated treatment well    Behavior During Therapy  [facility] for tasks assessed/performed       Past Medical History:  Diagnosis Date  . Anemia   . Anxiety   . Blood transfusion without reported diagnosis   . Medical history non-contributory     Past Surgical History:  Procedure Laterality Date  . DILATION AND CURETTAGE OF UTERUS    . WISDOM TOOTH EXTRACTION      There were no vitals filed for this visit.  Subjective Assessment - 07/12/18 1557    Subjective  Pt has had a stressful week, had a flare up Thursday to where it "looked like a football in my belly".  DId exercises x 2.      Currently in Pain?   No/denies       [facility] Adult PT Treatment/Exercise - 07/12/18 0001      Self-Care   Other Self-Care Comments   see education       Lumbar Exercises: Supine   Ab Set  10 reps    Pelvic Tilt  10 reps    Pelvic Tilt Limitations  for stretching and to find neutral     Clam  10 reps    Heel Slides  10 reps    Bent Knee Raise  10 reps      Lumbar Exercises: Sidelying   Clam  Both;20 reps             PT Education - 07/12/18 1518    Education Details  basic low abdominal prepilates stabilzation, fooddiary, activity dairy , anatomy, bracing     Person(s) Educated  Patient    Methods  Explanation;Demonstration    Comprehension  Verbalized understanding;Returned demonstration;Verbal cues required;Need further instruction       PT Short Term Goals - 07/04/18 0715      PT SHORT TERM GOAL #1   Title  Pt will understand diastasis recti and how to properly activate deep core muscles    Time  4    Period  Weeks    Status  New    Target Date  07/31/18  PT SHORT TERM GOAL #2   Title  Pt will be able to maintain core contraction with gentle stabilization exercises effectively     Time  4    Period  Weeks    Status  New    Target Date  07/31/18        PT Long Term Goals - 07/04/18 0717      PT LONG TERM GOAL #1   Title  Pt will be able to report no increased pain with walking, normal daily activities in the home (not lifting)     Time  8    Period  Weeks    Status  New    Target Date  08/28/18      PT LONG TERM GOAL #2   Title  Pt will understand and demo good standing posture, lifting and body mechanics for reductig strain on the abdominal wall     Time  8    Period  Weeks    Status  New    Target Date  08/28/18      PT LONG TERM GOAL #3   Title  Pt will be able to exercise, play with kids regularly and safely, without increased pain in abdominals     Time  8    Period  Weeks    Status  New    Target Date  08/28/18            Plan - 07/12/18 1504     Clinical Impression Statement  Seen via telehealth today. Spent time discussing the week and possible causes of her flare up.  Basic exercises were given for gentle lumbopelvic stability.      PT Treatment/Interventions  ADLs/Self Care Home Management;Therapeutic exercise;Therapeutic activities;Functional mobility training;Neuromuscular re-education;Manual techniques;Taping;Patient/family education    PT Next Visit Plan  check stab.  HEP conider Pilates Reformer for full body , bracing/tape research    PT Home Exercise Plan  isometric Tr A with breathing for abdominal wall    Consulted and Agree with Plan of Care  Patient       Patient will benefit from skilled therapeutic intervention in order to improve the following deficits and impairments:  Pain, Postural dysfunction, Decreased strength, Obesity, Decreased balance, Decreased skin integrity  Visit Diagnosis: Diastasis recti  Muscle weakness (generalized)  Abnormal posture  Cervicalgia  Other muscle spasm     Problem List Patient Active Problem List   Diagnosis Date Noted  . Nutritional counseling 04/26/2018  . SVD (spontaneous vaginal delivery) 12/26/2017  . Postpartum care following vaginal delivery (11/12) 12/26/2017  . Postpartum hemorrhage, delivered 12/26/2017  . Acute blood loss anemia 12/26/2017  . Post-dates pregnancy 12/25/2017  . PCOS (polycystic ovarian syndrome) 11/21/2013  . Family history of diabetes mellitus 11/21/2013  . Other allergy, other than to medicinal agents 05/30/2013    PAA,JENNIFER 07/12/2018, 4:09 PM  [facility] [address] [place], [place], [address] Phone: [phone]   Fax:  [phone]  Name: Aletta EdouardCarliceia N Leh MRN: [id number] Date of Birth: 02/03/1986   Karie MainlandJennifer Paa, PT 07/12/18 4:10 PM Phone: [phone] Fax: [phone]

## 2018-07-18 ENCOUNTER — Ambulatory Visit: Payer: 59 | Admitting: Registered"

## 2018-07-19 ENCOUNTER — Other Ambulatory Visit: Payer: Self-pay

## 2018-07-19 ENCOUNTER — Encounter: Payer: Self-pay | Admitting: Physical Therapy

## 2018-07-19 ENCOUNTER — Ambulatory Visit: Payer: 59 | Attending: Family Medicine | Admitting: Physical Therapy

## 2018-07-19 DIAGNOSIS — M542 Cervicalgia: Secondary | ICD-10-CM | POA: Insufficient documentation

## 2018-07-19 DIAGNOSIS — M62838 Other muscle spasm: Secondary | ICD-10-CM | POA: Insufficient documentation

## 2018-07-19 DIAGNOSIS — M6208 Separation of muscle (nontraumatic), other site: Secondary | ICD-10-CM | POA: Insufficient documentation

## 2018-07-19 DIAGNOSIS — R293 Abnormal posture: Secondary | ICD-10-CM | POA: Insufficient documentation

## 2018-07-19 DIAGNOSIS — M6281 Muscle weakness (generalized): Secondary | ICD-10-CM | POA: Insufficient documentation

## 2018-07-19 NOTE — Therapy (Signed)
[facility] Outpatient Rehabilitation [facility] [address] [place], [place], [address] Phone: [phone]   Fax:  [phone]  Physical Therapy Treatment  Patient Details  Name: Michelle Pacheco MRN: [id number] Date of Birth: Apr 26, 1985 Referring Provider (PT): Dr. Collie Siad   Encounter Date: 07/19/2018  PT End of Session - 07/19/18 1419    Visit Number  3    Number of Visits  8    Date for PT Re-Evaluation  08/28/18    PT Start Time  1402    PT Stop Time  1500    PT Time Calculation (min)  58 min    Activity Tolerance  Patient tolerated treatment well    Behavior During Therapy  [facility] for tasks assessed/performed       Past Medical History:  Diagnosis Date  . Anemia   . Anxiety   . Blood transfusion without reported diagnosis   . Medical history non-contributory     Past Surgical History:  Procedure Laterality Date  . DILATION AND CURETTAGE OF UTERUS    . WISDOM TOOTH EXTRACTION      There were no vitals filed for this visit.  Subjective Assessment - 07/19/18 1406    Subjective  For 2 days after last visit, it was so painful.  Not sure if its stress, activities/lifting. It hurt bad the other night when I was eating dinner.      Currently in Pain?  No/denies    Pain Location  Abdomen    Pain Orientation  Anterior    Pain Onset  More than a month ago       [facility] Adult PT Treatment/Exercise - 07/19/18 0001      Self-Care   Other Self-Care Comments   see education       Lumbar Exercises: Supine   Ab Set  10 reps    Pelvic Tilt  10 reps    Pelvic Tilt Limitations  over ball    Clam  10 reps    Clam Limitations  with ball     Heel Slides  10 reps    Heel Slides Limitations  with ball     Bent Knee Raise  10 reps    Bent Knee Raise Limitations  with ball       Lumbar Exercises: Quadruped   Single Arm Raise  5 reps    Straight Leg Raise  5 reps    Other Quadruped Lumbar Exercises  isometric TrA x 5 in neutral       Manual Therapy   Manual therapy comments  trial of Kinesiotape              PT Education - 07/19/18 1454    Education Details  stabilization  technique, kinesiotape for abdominal wall , possible skin reactions, core anatomy     Person(s) Educated  Patient    Methods  Explanation    Comprehension  Verbalized understanding       PT Short Term Goals - 07/19/18 1431      PT SHORT TERM GOAL #1   Title  Pt will understand diastasis recti and how to properly activate deep core muscles    Baseline  min cues needed     Status  On-going      PT SHORT TERM GOAL #2   Title  Pt will be able to maintain core contraction with gentle stabilization exercises effectively     Baseline  needs cues     Status  On-going  PT Long Term Goals - 07/19/18 1431      PT LONG TERM GOAL #1   Title  Pt will be able to report no increased pain with walking, normal daily activities in the home (not lifting)     Baseline  sporadic     Status  On-going      PT LONG TERM GOAL #2   Title  Pt will understand and demo good standing posture, lifting and body mechanics for reductig strain on the abdominal wall     Status  On-going      PT LONG TERM GOAL #3   Title  Pt will be able to exercise, play with kids regularly and safely, without increased pain in abdominals     Status  On-going            Plan - 07/19/18 1517    Clinical Impression Statement  Patient needed mod cues for technique of lumbopelvic stabilization.  Needs to perform a posterior pelvic tilt to get to a neutral position.  Her pattern of pain and description seems indicative of GI component.  When her abdomen is tender , it protrudes.  Always at night, after eating.  Trial of Kinesotape (to Rt upper trap) for skin irritation for an abdominal technique to assist with closur    PT Treatment/Interventions  ADLs/Self Care Home Management;Therapeutic exercise;Therapeutic activities;Functional mobility training;Neuromuscular re-education;Manual  techniques;Taping;Patient/family education    PT Next Visit Plan  core, stability , bracing/tape research, quadruped     PT Home Exercise Plan  isometric Tr A with breathing for abdominal wall, clam, march, bent knee fall out     Consulted and Agree with Plan of Care  Patient       Patient will benefit from skilled therapeutic intervention in order to improve the following deficits and impairments:  Pain, Postural dysfunction, Decreased strength, Obesity, Decreased balance, Decreased skin integrity  Visit Diagnosis: Diastasis recti  Muscle weakness (generalized)  Abnormal posture     Problem List Patient Active Problem List   Diagnosis Date Noted  . Nutritional counseling 04/26/2018  . SVD (spontaneous vaginal delivery) 12/26/2017  . Postpartum care following vaginal delivery (11/12) 12/26/2017  . Postpartum hemorrhage, delivered 12/26/2017  . Acute blood loss anemia 12/26/2017  . Post-dates pregnancy 12/25/2017  . PCOS (polycystic ovarian syndrome) 11/21/2013  . Family history of diabetes mellitus 11/21/2013  . Other allergy, other than to medicinal agents 05/30/2013    Michelle Pacheco 07/19/2018, 3:28 PM  [facility] [address] [place], [place], [address] Phone: [phone]   Fax:  [phone]  Name: Michelle Pacheco MRN: [id number] Date of Birth: 03/24/1985  Karie MainlandJennifer Ronav Furney, PT 07/19/18 3:28 PM Phone: [phone] Fax: [phone]

## 2018-07-24 ENCOUNTER — Ambulatory Visit: Payer: 59 | Admitting: Physical Therapy

## 2018-07-24 ENCOUNTER — Other Ambulatory Visit: Payer: Self-pay

## 2018-07-24 ENCOUNTER — Encounter: Payer: Self-pay | Admitting: Physical Therapy

## 2018-07-24 DIAGNOSIS — M542 Cervicalgia: Secondary | ICD-10-CM

## 2018-07-24 DIAGNOSIS — M62838 Other muscle spasm: Secondary | ICD-10-CM | POA: Diagnosis not present

## 2018-07-24 DIAGNOSIS — M6281 Muscle weakness (generalized): Secondary | ICD-10-CM | POA: Diagnosis not present

## 2018-07-24 DIAGNOSIS — M6208 Separation of muscle (nontraumatic), other site: Secondary | ICD-10-CM

## 2018-07-24 DIAGNOSIS — R293 Abnormal posture: Secondary | ICD-10-CM | POA: Diagnosis not present

## 2018-07-24 NOTE — Therapy (Signed)
[place], [place], [address] Phone: [phone]   Fax:  [phone]  Physical Therapy Treatment  Patient Details  Name: Michelle Pacheco MRN: [id number] Date of Birth: 07-05-1985 Referring Provider (PT): Dr. Delia Chimes   Encounter Date: 07/24/2018  PT End of Session - 07/24/18 1445    Visit Number  4    Number of Visits  8    Date for PT Re-Evaluation  08/28/18    PT Start Time  1445   pt late    PT Stop Time  1518    PT Time Calculation (min)  33 min    Activity Tolerance  Patient tolerated treatment well    Behavior During Therapy  [facility] for tasks assessed/performed       Past Medical History:  Diagnosis Date  . Anemia   . Anxiety   . Blood transfusion without reported diagnosis   . Medical history non-contributory     Past Surgical History:  Procedure Laterality Date  . DILATION AND CURETTAGE OF UTERUS    . WISDOM TOOTH EXTRACTION      There were no vitals filed for this visit.  Subjective Assessment - 07/24/18 1444    Subjective  My belly was out alot over the weekend.  Was constipated.  The tape was OK.      Currently in Pain?  No/denies         [facility] Adult PT Treatment/Exercise - 07/24/18 0001      Pilates   Pilates Reformer  Footwork see note      Manual Therapy   Manual therapy comments   Kinesiotape for diastasis small strips crossing    50% tension in middle       Pilates Reformer used for LE/core strength, postural strength, lumbopelvic disassociation and core control.  Exercises included:  Footwork:  2 Red 1 blue parallel and turnout on heels and turnout  Cue for full knee extension and core engaged   Clam with green band x 10 alternating sides  Bridge [place] x 10 with green band for lateral hip activation      PT Short Term Goals - 07/19/18 1431      PT SHORT TERM GOAL #1   Title  Pt will understand diastasis recti and how to properly activate deep  core muscles    Baseline  min cues needed     Status  On-going      PT SHORT TERM GOAL #2   Title  Pt will be able to maintain core contraction with gentle stabilization exercises effectively     Baseline  needs cues     Status  On-going        PT Long Term Goals - 07/19/18 1431      PT LONG TERM GOAL #1   Title  Pt will be able to report no increased pain with walking, normal daily activities in the home (not lifting)     Baseline  sporadic     Status  On-going      PT LONG TERM GOAL #2   Title  Pt will understand and demo good standing posture, lifting and body mechanics for reductig strain on the abdominal wall     Status  On-going      PT LONG TERM GOAL #3   Title  Pt will be able to exercise, play with kids regularly and safely, without increased pain in abdominals     Status  On-going  Plan - 07/24/18 1516    Clinical Impression Statement  Tape to rectus today.  SHe may be straining with bowel movements and that may be contributing to her hernia/bulge in umbilical area.  Tried basic Reformer exercise to teach core control with abdominal contraction.      PT Treatment/Interventions  ADLs/Self Care Home Management;Therapeutic exercise;Therapeutic activities;Functional mobility training;Neuromuscular re-education;Manual techniques;Taping;Patient/family education    PT Next Visit Plan  core, stability , bracing/tape research, quadruped     PT Home Exercise Plan  isometric Tr A with breathing for abdominal wall, clam, march, bent knee fall out     Consulted and Agree with Plan of Care  Patient       Patient will benefit from skilled therapeutic intervention in order to improve the following deficits and impairments:  Pain, Postural dysfunction, Decreased strength, Obesity, Decreased balance, Decreased skin integrity  Visit Diagnosis: Diastasis recti  Muscle weakness (generalized)  Abnormal posture  Cervicalgia  Other muscle spasm     Problem  List Patient Active Problem List   Diagnosis Date Noted  . Nutritional counseling 04/26/2018  . SVD (spontaneous vaginal delivery) 12/26/2017  . Postpartum care following vaginal delivery (11/12) 12/26/2017  . Postpartum hemorrhage, delivered 12/26/2017  . Acute blood loss anemia 12/26/2017  . Post-dates pregnancy 12/25/2017  . PCOS (polycystic ovarian syndrome) 11/21/2013  . Family history of diabetes mellitus 11/21/2013  . Other allergy, other than to medicinal agents 05/30/2013    Abid Bolla 07/24/2018, 4:27 PM  [facility] [address] [place], [place], [address] Phone: [phone]   Fax:  [phone]  Name: Michelle Pacheco MRN: [id number] Date of Birth: 07/20/1985  [name], PT 07/24/18 4:27 PM Phone: [phone] Fax: [phone]

## 2018-08-02 ENCOUNTER — Ambulatory Visit: Payer: 59 | Admitting: Physical Therapy

## 2018-08-07 ENCOUNTER — Encounter: Payer: Self-pay | Admitting: Physical Therapy

## 2018-08-07 ENCOUNTER — Other Ambulatory Visit: Payer: Self-pay

## 2018-08-07 ENCOUNTER — Ambulatory Visit: Payer: 59 | Admitting: Physical Therapy

## 2018-08-07 DIAGNOSIS — M62838 Other muscle spasm: Secondary | ICD-10-CM | POA: Diagnosis not present

## 2018-08-07 DIAGNOSIS — M542 Cervicalgia: Secondary | ICD-10-CM | POA: Diagnosis not present

## 2018-08-07 DIAGNOSIS — M6208 Separation of muscle (nontraumatic), other site: Secondary | ICD-10-CM

## 2018-08-07 DIAGNOSIS — R293 Abnormal posture: Secondary | ICD-10-CM | POA: Diagnosis not present

## 2018-08-07 DIAGNOSIS — M6281 Muscle weakness (generalized): Secondary | ICD-10-CM

## 2018-08-07 MED FILL — CITRANATAL 90 DHA COMBO PAC: 90-1 & 300 | 30 days supply | Qty: 60 | Fill #0

## 2018-08-07 NOTE — Patient Instructions (Signed)
Step 1  Step 2  Kneeling Plank with Feet on Ground reps: 5  sets: 1  hold: 30  daily: 1  weekly: 7 Setup  Begin lying on your front with your elbows on the ground. Movement  Press yourself up into a plank position, keeping your knees on the ground. Return to the starting position and repeat. Tip  Make sure to keep your back straight in the plank and look straight down between your hands during the exercise.

## 2018-08-07 NOTE — Therapy (Signed)
[place], [place], [address] Phone: [phone]   Fax:  [phone]  Physical Therapy Treatment  Patient Details  Name: Michelle Pacheco MRN: [id number] Date of Birth: 1985/11/28 Referring Provider (PT): Dr. Delia Chimes   Encounter Date: 08/07/2018  PT End of Session - 08/07/18 1456    Visit Number  5    Number of Visits  8    Date for PT Re-Evaluation  08/28/18    PT Start Time  1740    PT Stop Time  1540    PT Time Calculation (min)  56 min    Activity Tolerance  Patient tolerated treatment well    Behavior During Therapy  [facility] for tasks assessed/performed       Past Medical History:  Diagnosis Date  . Anemia   . Anxiety   . Blood transfusion without reported diagnosis   . Medical history non-contributory     Past Surgical History:  Procedure Laterality Date  . DILATION AND CURETTAGE OF UTERUS    . WISDOM TOOTH EXTRACTION      There were no vitals filed for this visit.  Subjective Assessment - 08/07/18 1445    Subjective  Had no flare ups, the whole 9 days I wore the tape. Took a picture to show me.  I actually did more than usual and not until I took it off did it flare up.    Currently in Pain?  No/denies         [facility] Adult PT Treatment/Exercise - 08/07/18 0001      Self-Care   Other Self-Care Comments   stabilization, core, tape demo and rationale, how to tape diastasis      Lumbar Exercises: Aerobic   Nustep  7 min UE and LE L7       Lumbar Exercises: Supine   Heel Slides  10 reps    Bridge  10 reps    Bridge Limitations  articulating     Bridge with clamshell  10 reps    Advanced Lumbar Stabilization Limitations  supine with magic circle x 10 (dead bug variation) x 10 each , overhead reach x 10 back flat       Lumbar Exercises: Quadruped   Plank  quad plank x 5 sec x 5    Other Quadruped Lumbar Exercises  elbow plank on knees 5 x 10 sec       Manual Therapy   Manual  therapy comments   Kinesiotape for diastasis small strips crossing    50% tension in middle , 7th strip horizontal superior umbili        PT Education - 08/07/18 1548    Education Details  tape, technique, [employer] link to [employer], core, plank variations    Person(s) Educated  Patient    Methods  Explanation;Demonstration    Comprehension  Verbalized understanding;Returned demonstration;Tactile cues required;Verbal cues required;Need further instruction       PT Short Term Goals - 08/07/18 1517      PT SHORT TERM GOAL #1   Title  Pt will understand diastasis recti and how to properly activate deep core muscles    Status  Achieved      PT SHORT TERM GOAL #2   Title  Pt will be able to maintain core contraction with gentle stabilization exercises effectively     Status  Achieved        PT Long Term Goals - 08/07/18 1457  PT LONG TERM GOAL #1   Title  Pt will be able to report no increased pain with walking, normal daily activities in the home (not lifting)     Baseline  when patient is flared up, tender, pain is increased    Status  On-going      PT LONG TERM GOAL #2   Title  Pt will understand and demo good standing posture, lifting and body mechanics for reductig strain on the abdominal wall     Status  On-going      PT LONG TERM GOAL #3   Title  Pt will be able to exercise, play with kids regularly and safely, without increased pain in abdominals     Status  On-going            Plan - 08/07/18 1517    Clinical Impression Statement  Pt with excellent outcome with trial of tape.  She left it on too long but had no ill effects.  Has been doing her HEP but not daily.  Increased challenge today and encouraged her to purchase her tape.  Wil extend until end of July.    PT Treatment/Interventions  ADLs/Self Care Home Management;Therapeutic exercise;Therapeutic activities;Functional mobility training;Neuromuscular re-education;Manual techniques;Taping;Patient/family  education    PT Next Visit Plan  core, stability, quadruped , retape?    PT Home Exercise Plan  isometric Tr A with breathing for abdominal wall, clam, march, bent knee fall out , plank    Consulted and Agree with Plan of Care  Patient       Patient will benefit from skilled therapeutic intervention in order to improve the following deficits and impairments:  Pain, Postural dysfunction, Decreased strength, Obesity, Decreased balance, Decreased skin integrity  Visit Diagnosis: 1. Diastasis recti   2. Muscle weakness (generalized)   3. Abnormal posture   4. Cervicalgia   5. Other muscle spasm        Problem List Patient Active Problem List   Diagnosis Date Noted  . Nutritional counseling 04/26/2018  . SVD (spontaneous vaginal delivery) 12/26/2017  . Postpartum care following vaginal delivery (11/12) 12/26/2017  . Postpartum hemorrhage, delivered 12/26/2017  . Acute blood loss anemia 12/26/2017  . Post-dates pregnancy 12/25/2017  . PCOS (polycystic ovarian syndrome) 11/21/2013  . Family history of diabetes mellitus 11/21/2013  . Other allergy, other than to medicinal agents 05/30/2013    Manfred Laspina 08/07/2018, 3:55 PM  [facility] [address] [place], [place], [address] Phone: [phone]   Fax:  [phone]  Name: YULMA POSTLETHWAITE MRN: [id number] Date of Birth: 1985/08/13   [name], PT 08/07/18 3:55 PM Phone: [phone] Fax: [phone]

## 2018-08-21 ENCOUNTER — Ambulatory Visit: Payer: 59 | Attending: Family Medicine | Admitting: Physical Therapy

## 2018-08-21 ENCOUNTER — Encounter: Payer: Self-pay | Admitting: Physical Therapy

## 2018-08-21 ENCOUNTER — Other Ambulatory Visit: Payer: Self-pay

## 2018-08-21 DIAGNOSIS — R293 Abnormal posture: Secondary | ICD-10-CM | POA: Insufficient documentation

## 2018-08-21 DIAGNOSIS — M6281 Muscle weakness (generalized): Secondary | ICD-10-CM | POA: Insufficient documentation

## 2018-08-21 DIAGNOSIS — M6208 Separation of muscle (nontraumatic), other site: Secondary | ICD-10-CM | POA: Insufficient documentation

## 2018-08-21 NOTE — Therapy (Signed)
[place], [place], [address] Phone: [phone]   Fax:  [phone]  Physical Therapy Treatment  Patient Details  Name: Michelle Pacheco MRN: [id number] Date of Birth: Jul 07, 1985 Referring Provider (PT): Dr. Delia Chimes   Encounter Date: 08/21/2018  PT End of Session - 08/21/18 1444    Visit Number  6    Number of Visits  10    Date for PT Re-Evaluation  09/28/18    PT Start Time  1638    PT Stop Time  1535    PT Time Calculation (min)  58 min    Activity Tolerance  Patient tolerated treatment well    Behavior During Therapy  [facility] for tasks assessed/performed       Past Medical History:  Diagnosis Date  . Anemia   . Anxiety   . Blood transfusion without reported diagnosis   . Medical history non-contributory     Past Surgical History:  Procedure Laterality Date  . DILATION AND CURETTAGE OF UTERUS    . WISDOM TOOTH EXTRACTION      There were no vitals filed for this visit.  Subjective Assessment - 08/21/18 1442    Subjective  I have had no flare ups since I last saw you.  I picked up a laundry basket and it felt a littel tender then went away the same day.    Currently in Pain?  No/denies           [facility] Adult PT Treatment/Exercise - 08/21/18 0001      Self-Care   Other Self-Care Comments   core, technique , Post PT plan, Pilates, renewal       Lumbar Exercises: Stretches   Lower Trunk Rotation  10 seconds    Lower Trunk Rotation Limitations  x 10 with ball       Lumbar Exercises: Aerobic   Nustep  7 min UE and LE L7       Lumbar Exercises: Supine   Clam  10 reps;20 reps    Clam Limitations  used ball under feet (small soft)     Dead Bug  10 reps    Bridge  10 reps    Bridge Limitations  articulating       Lumbar Exercises: Quadruped   Other Quadruped Lumbar Exercises  elbow plank on knees 5 x 10 sec     Other Quadruped Lumbar Exercises  used ball, childs pose       Manual Therapy   Manual therapy comments   Kinesiotape for diastasis small strips crossing    50% tension in middle , 7th strip horizontal superior umbili              PT Short Term Goals - 08/21/18 1450      PT SHORT TERM GOAL #1   Title  Pt will understand diastasis recti and how to properly activate deep core muscles    Status  Achieved      PT SHORT TERM GOAL #2   Title  Pt will be able to maintain core contraction with gentle stabilization exercises effectively     Status  Achieved        PT Long Term Goals - 08/21/18 1450      PT LONG TERM GOAL #1   Title  Pt will be able to report no increased pain with walking, normal daily activities in the home (not lifting)     Status  Achieved  PT LONG TERM GOAL #2   Title  Pt will understand and demo good standing posture, lifting and body mechanics for reducing strain on the abdominal wall    Status  On-going      PT LONG TERM GOAL #3   Title  Pt will be able to exercise, play with kids regularly and safely, without increased pain in abdominals     Status  Achieved            Plan - 08/21/18 1452    Clinical Impression Statement  Patient is improving a great deal, is more aware of her core and posture with ADLs.  She will continue with a few more visits for more education and core strengthening.    Personal Factors and Comorbidities  Comorbidity 1;Time since onset of injury/illness/exacerbation    Comorbidities  multiple pregnancies with chronic issue of diastasis    Examination-Activity Limitations  Bed Mobility;Lift;Carry;Caring for Others;Sleep    Examination-Participation Restrictions  Interpersonal Relationship    Stability/Clinical Decision Making  Evolving/Moderate complexity    Rehab Potential  Excellent    PT Frequency  1x / week    PT Duration  4 weeks    PT Treatment/Interventions  ADLs/Self Care Home Management;Therapeutic exercise;Therapeutic activities;Functional mobility training;Neuromuscular  re-education;Manual techniques;Taping;Patient/family education    PT Next Visit Plan  core, stability, quadruped , retape? Pilates    PT Home Exercise Plan  isometric Tr A with breathing for abdominal wall, clam, march, bent knee fall out , plank    Consulted and Agree with Plan of Care  Patient       Patient will benefit from skilled therapeutic intervention in order to improve the following deficits and impairments:  Pain, Postural dysfunction, Decreased strength, Obesity, Decreased balance, Decreased skin integrity  Visit Diagnosis: 1. Diastasis recti   2. Muscle weakness (generalized)   3. Abnormal posture        Problem List Patient Active Problem List   Diagnosis Date Noted  . Nutritional counseling 04/26/2018  . SVD (spontaneous vaginal delivery) 12/26/2017  . Postpartum care following vaginal delivery (11/12) 12/26/2017  . Postpartum hemorrhage, delivered 12/26/2017  . Acute blood loss anemia 12/26/2017  . Post-dates pregnancy 12/25/2017  . PCOS (polycystic ovarian syndrome) 11/21/2013  . Family history of diabetes mellitus 11/21/2013  . Other allergy, other than to medicinal agents 05/30/2013    Sten Dematteo 08/21/2018, 3:39 PM  [facility] [address] [place], [place], [address] Phone: [phone]   Fax:  [phone]  Name: Michelle Pacheco MRN: [id number] Date of Birth: 01/02/1986  [name], PT 08/21/18 3:39 PM Phone: [phone] Fax: [phone]

## 2018-08-28 ENCOUNTER — Encounter: Payer: 59 | Attending: Family Medicine | Admitting: Registered"

## 2018-08-28 ENCOUNTER — Other Ambulatory Visit: Payer: Self-pay

## 2018-08-28 DIAGNOSIS — Z713 Dietary counseling and surveillance: Secondary | ICD-10-CM | POA: Insufficient documentation

## 2018-08-28 NOTE — Patient Instructions (Addendum)
[email or web address] - watch before end of July Eat enough for nursing Set alerts when you are working so you don't get to the point of being overly hungry Continue with including self-care Consider keeping water in eye sight to help you remember to drink.

## 2018-08-28 NOTE — Progress Notes (Signed)
[employer] Employee visit 2 of 3  Appt start time: 4270 end time:  1505.   Assessment:  Primary concerns today: Insurance requirement  Employee states she has started PT for hernia in stomach, states it is similar to piliates.   Employee states her baby is 35 months old and she is trying to continue to breastfeed but having trouble with milk supply. Discussed need for adequate caloric intake to support breastfeeding.  Employee states she has stopped taking biotin supplement, puts some drops in her hair, seems to be a little better.  Employee has not started the inositol supplement for PCOS. Pt states she doesn't feel her husband understands the need for it or how the PCOS affects her.   Employee states many members in her family as well as in her husband's family are very weight focused and her 35 year old daughter is showing signs of being afraid of eating because fear of gaining weight.   Dinner time sit down with family eats when family then. Working from home eating at desk  Employee reports at night sometimes when she has urge to eat will have a tea instead. Next visit RD may go over the "Am I Hungry" flow sheet and help employee give herself full-permission to eat in spite of the popular rule that we are supposed to eat after 7 pm.  Employee asked questions about specific products and I asked her to take pics with her phone and we can look at it together next time, may use [employer] app.  MEDICATIONS: reviewed   DIETARY INTAKE:  Avoided foods include nuts, citrus, avocado - loves these foods and sometime will eat some in small quantities, but states her doctor states she is playing with fire and to make sure she has an epi-pen.  24-hr recall:  Did not assess this visit B ( AM):   Snk ( AM):  L ( PM):  Snk ( PM):  D ( PM):  Snank ( PM):  Beverages:   Usual physical activity:   Estimated energy needs: 2000 calories  Progress Towards Goal(s):  In progress.   Nutritional  Diagnosis:  Inadequate fluid intake As related to increased needs during lactation.  As evidenced by dietary recall less than recommended wated intake and low breastmilk supply.    Intervention:  Nutrition Education. Discussed realistic expectations from weight loss programs. Reviewed importance of getting adequate nutrition and fluid, extra needed while lactating. Discussed inositol role in PCOS.  Plan: [email or web address] - watch before end of July Eat enough for nursing Set alerts when you are working so you don't get to the point of being overly hungry Continue with including self-care Consider keeping water in eye sight to help you remember to drink.  Teaching Method Utilized:  Visual Auditory  Handouts given during visit include:  Nutrition for pregnancy (because it is similar to lactation)  Barriers to learning/adherence to lifestyle change: none  Demonstrated degree of understanding via:  Teach Back   Monitoring/Evaluation:  Dietary intake, exercise, PCOS symptoms, and body weight in 3 week(s).

## 2018-08-30 ENCOUNTER — Ambulatory Visit: Payer: 59 | Admitting: Physical Therapy

## 2018-09-04 ENCOUNTER — Ambulatory Visit: Payer: 59 | Admitting: Physical Therapy

## 2018-09-20 ENCOUNTER — Other Ambulatory Visit: Payer: Self-pay

## 2018-09-20 ENCOUNTER — Ambulatory Visit: Payer: 59 | Attending: Family Medicine | Admitting: Physical Therapy

## 2018-09-20 ENCOUNTER — Encounter: Payer: Self-pay | Admitting: Physical Therapy

## 2018-09-20 DIAGNOSIS — M6208 Separation of muscle (nontraumatic), other site: Secondary | ICD-10-CM | POA: Diagnosis not present

## 2018-09-20 DIAGNOSIS — R293 Abnormal posture: Secondary | ICD-10-CM | POA: Diagnosis not present

## 2018-09-20 DIAGNOSIS — M6281 Muscle weakness (generalized): Secondary | ICD-10-CM | POA: Insufficient documentation

## 2018-09-20 NOTE — Therapy (Signed)
[place], [place], [address] Phone: [phone]   Fax:  [phone]  Physical Therapy Treatment/Discharge  Patient Details  Name: Michelle Pacheco MRN: [id number] Date of Birth: 03/25/1985 Referring Provider (PT): Dr. Delia Chimes   Encounter Date: 09/20/2018  PT End of Session - 09/20/18 1509    Visit Number  7    Number of Visits  10    Date for PT Re-Evaluation  09/28/18    PT Start Time  1500    PT Stop Time  1530    PT Time Calculation (min)  30 min    Activity Tolerance  Patient tolerated treatment well    Behavior During Therapy  [facility] for tasks assessed/performed       Past Medical History:  Diagnosis Date  . Anemia   . Anxiety   . Blood transfusion without reported diagnosis   . Medical history non-contributory     Past Surgical History:  Procedure Laterality Date  . DILATION AND CURETTAGE OF UTERUS    . WISDOM TOOTH EXTRACTION      There were no vitals filed for this visit.  Subjective Assessment - 09/20/18 1506    Subjective  Pt missed some appts due to possible COVID-19 exposure and symptoms.  Her only flare up last week.  She is going to finish PT right now due to work schedule.  She was sick and had nausea, diarrhea.    Currently in Pain?  No/denies         [facility] Adult PT Treatment/Exercise - 09/20/18 0001      Self-Care   Other Self-Care Comments   DC, POC, resources      Lumbar Exercises: Supine   Ab Set  10 reps    Clam  10 reps;20 reps    Bridge  10 reps    Bridge with clamshell  10 reps      Lumbar Exercises: Sidelying   Hip Abduction  Both;10 reps          PT Education - 09/20/18 1945    Education Details  resources, POC, core HEP technique, level 2-3 exercises for progression    Person(s) Educated  Patient    Methods  Explanation;Demonstration    Comprehension  Verbalized understanding;Returned demonstration       PT Short Term Goals - 08/21/18 1450       PT SHORT TERM GOAL #1   Title  Pt will understand diastasis recti and how to properly activate deep core muscles    Status  Achieved      PT SHORT TERM GOAL #2   Title  Pt will be able to maintain core contraction with gentle stabilization exercises effectively     Status  Achieved        PT Long Term Goals - 09/20/18 1510      PT LONG TERM GOAL #1   Title  Pt will be able to report no increased pain with walking, normal daily activities in the home (not lifting)     Status  Achieved      PT LONG TERM GOAL #2   Title  Pt will understand and demo good standing posture, lifting and body mechanics for reducing strain on the abdominal wall    Status  Achieved      PT LONG TERM GOAL #3   Title  Pt will be able to exercise, play with kids regularly and safely, without increased pain in abdominals  Status  Achieved            Plan - 09/20/18 1946    Clinical Impression Statement  Michelle Pacheco has had about 4 weeks qithout PT and has continued to do well up unitl last week.  She has been eating "like crap" and has not had more flare ups but has had increased stress which she feels may be a factor as well.  She thinks she may have had COVID-19 despite a neg test.  She has not had pain with activity.  She has the resources to do her program on her own. She is now working from home and will not be able to come to [facility] for PT.    PT Next Visit Plan  NA, DC    PT Home Exercise Plan  isometric Tr A with breathing for abdominal wall, clam, march, bent knee fall out , plank, added bridge with clam, hip abduction    Consulted and Agree with Plan of Care  Patient       Patient will benefit from skilled therapeutic intervention in order to improve the following deficits and impairments:     Visit Diagnosis: 1. Diastasis recti   2. Muscle weakness (generalized)   3. Abnormal posture        Problem List Patient Active Problem List   Diagnosis Date Noted  . Nutritional counseling  04/26/2018  . SVD (spontaneous vaginal delivery) 12/26/2017  . Postpartum care following vaginal delivery (11/12) 12/26/2017  . Postpartum hemorrhage, delivered 12/26/2017  . Acute blood loss anemia 12/26/2017  . Post-dates pregnancy 12/25/2017  . PCOS (polycystic ovarian syndrome) 11/21/2013  . Family history of diabetes mellitus 11/21/2013  . Other allergy, other than to medicinal agents 05/30/2013    PAA,JENNIFER 09/20/2018, 7:53 PM  [facility] [address] [place], [place], [address] Phone: [phone]   Fax:  [phone]  Name: Michelle Pacheco MRN: [id number] Date of Birth: 14-Nov-1985  PHYSICAL THERAPY DISCHARGE SUMMARY  Visits from Start of Care: 7  Current functional level related to goals / functional outcomes: See above    Remaining deficits: Core weakness   Education / Equipment: Core, Pilates, HEP, taping technique   Plan: Patient agrees to discharge.  Patient goals were partially met. Patient is being discharged due to being pleased with the current functional level.  ?????    Raeford Razor, PT 09/20/18 7:53 PM Phone: [phone] Fax: [phone]

## 2018-09-25 ENCOUNTER — Ambulatory Visit: Payer: 59 | Admitting: Physical Therapy

## 2018-10-11 ENCOUNTER — Ambulatory Visit: Payer: 59 | Admitting: Registered"

## 2018-10-12 MED FILL — CITRANATAL 90 DHA COMBO PAC: 90-1 & 300 | 30 days supply | Qty: 60 | Fill #1

## 2018-11-28 IMAGING — US US PELVIS LIMITED
1 series · 15 of 25 positions shown · non-contrast
Comparison: None.

CLINICAL DATA: Question retained products of conception. Bleeding
after recent delivery.

EXAM:
TRANSABDOMINAL ULTRASOUND OF PELVIS
TECHNIQUE: Transabdominal ultrasound examination of the pelvis was performed
including evaluation of the uterus, ovaries, adnexal regions, and
pelvic cul-de-sac.

[Series 1: us pelvis limited · 15 of 25 slices shown]
[im 1/25]
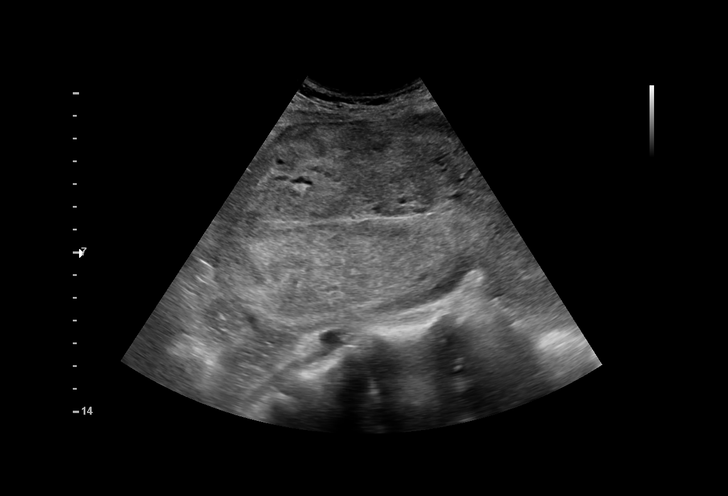
[im 3/25]
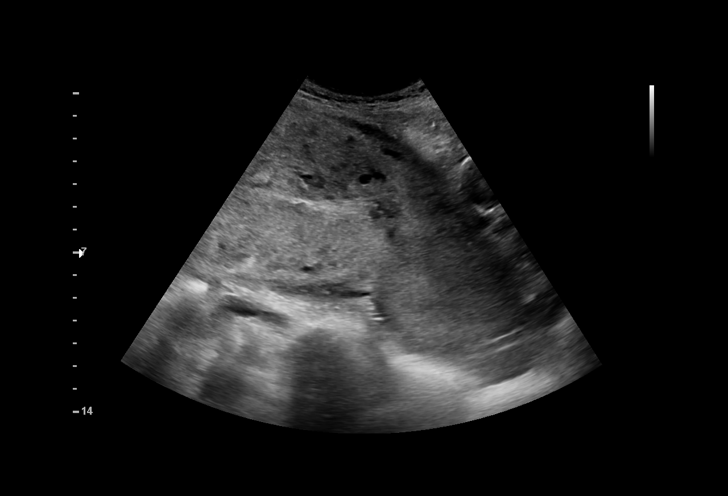
[im 5/25]
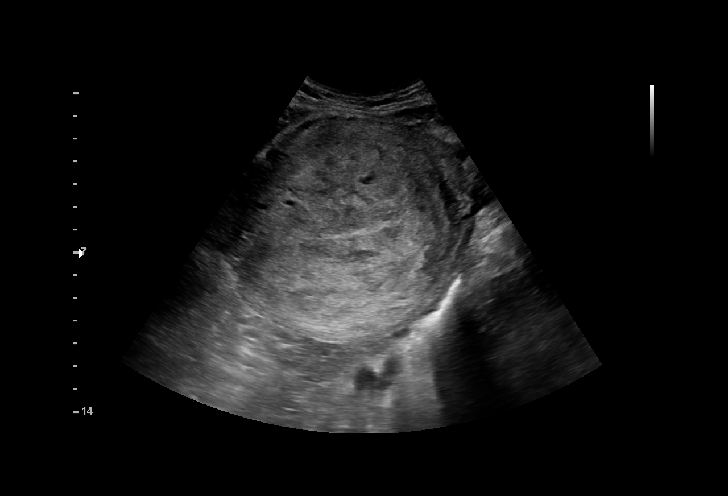
[im 6/25]
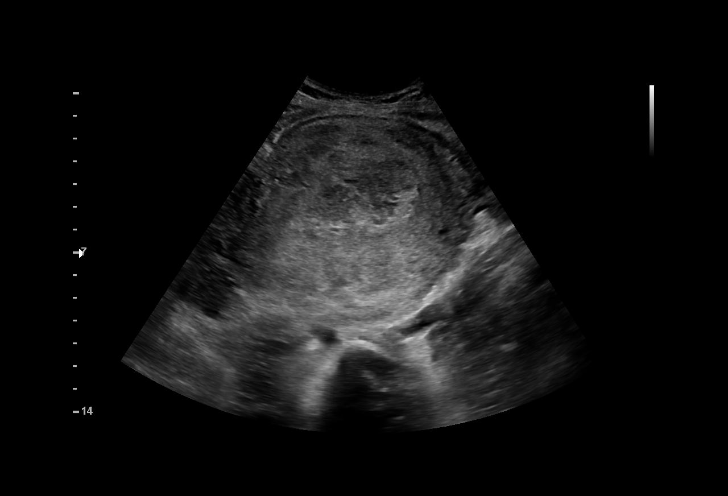
[im 8/25]
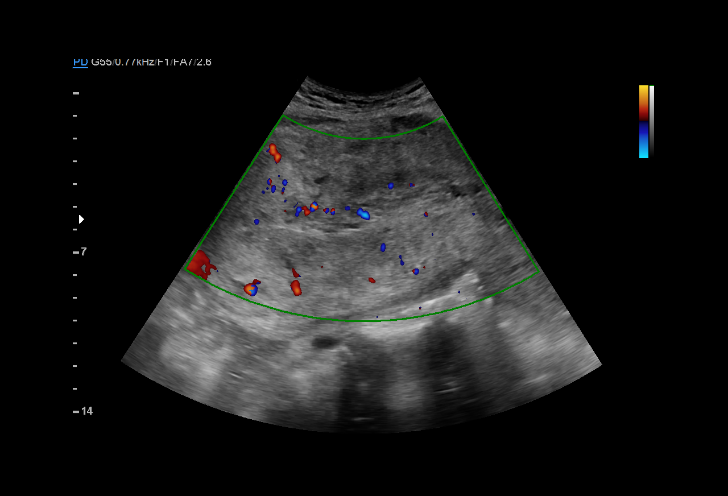
[im 10/25]
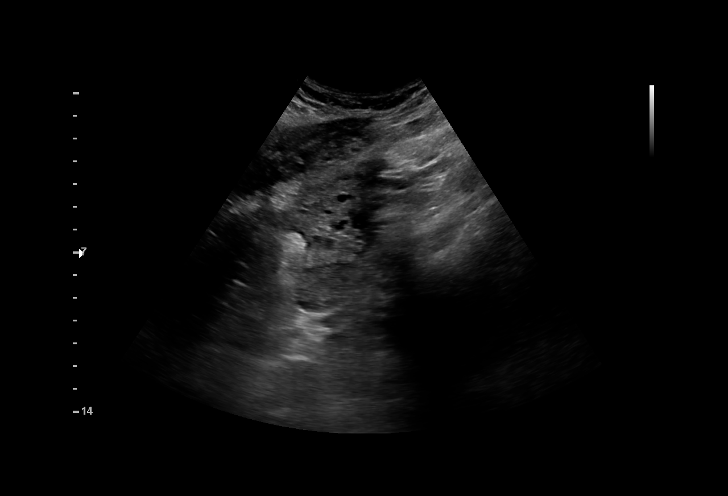
[im 11/25]
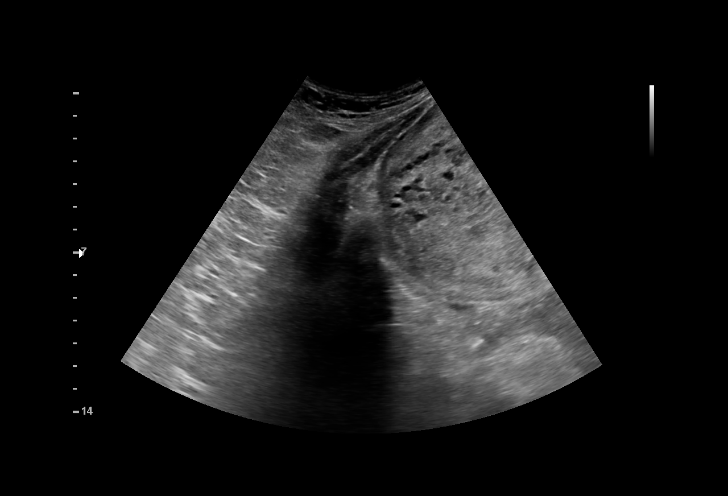
[im 13/25]
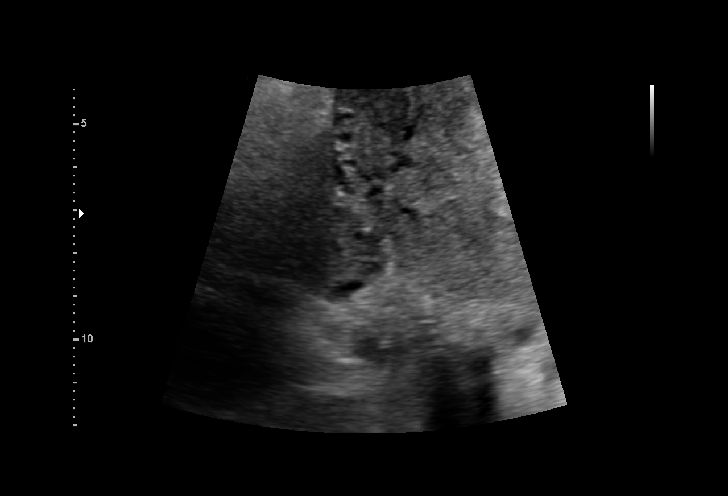
[im 15/25]
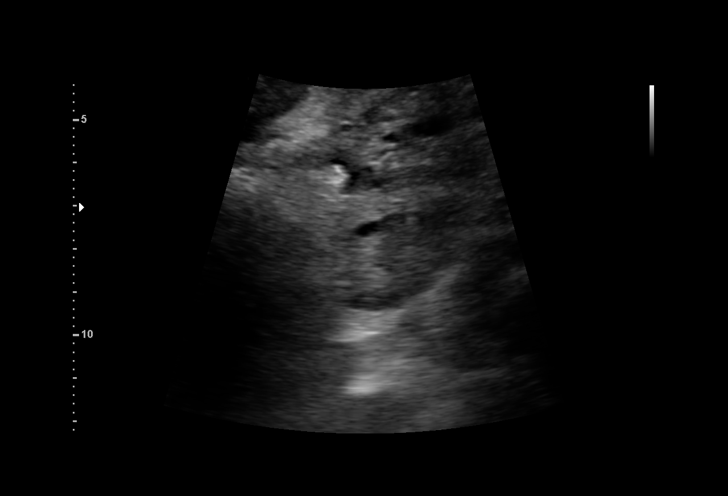
[im 16/25]
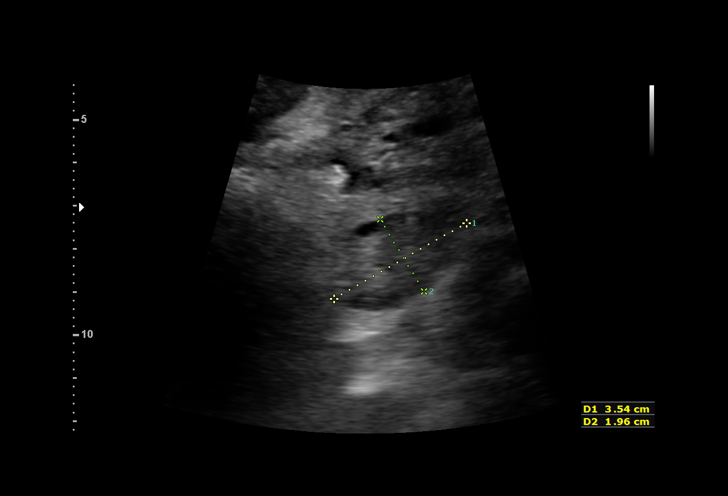
[im 18/25]
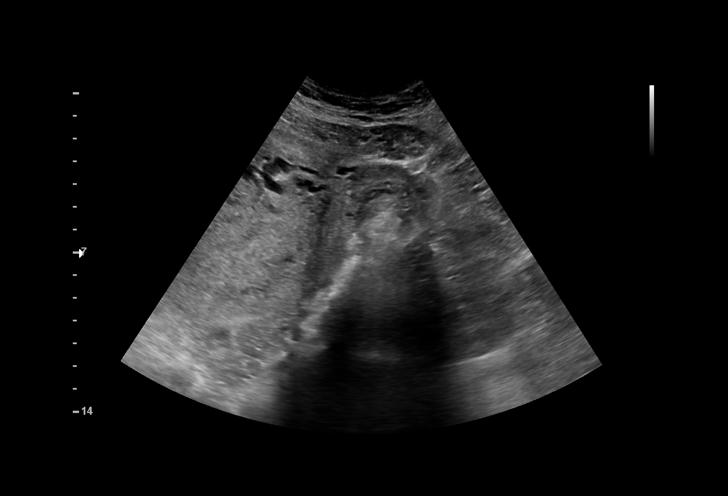
[im 20/25]
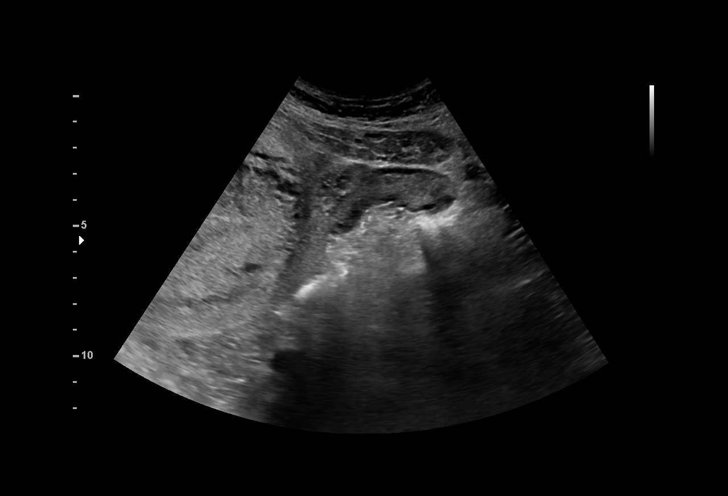
[im 21/25]
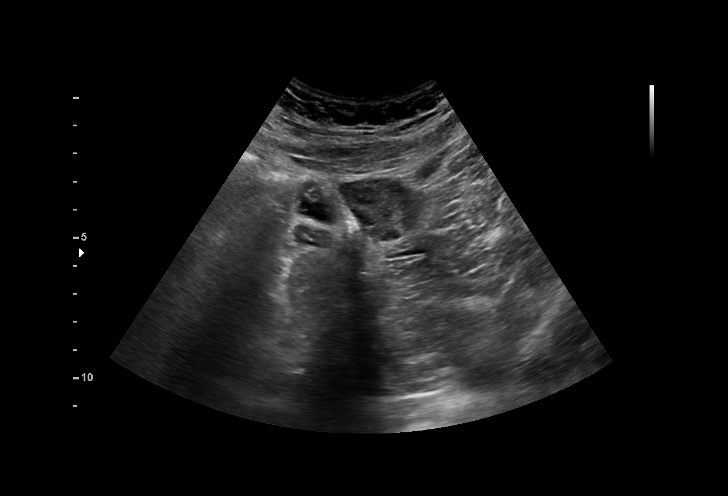
[im 23/25]
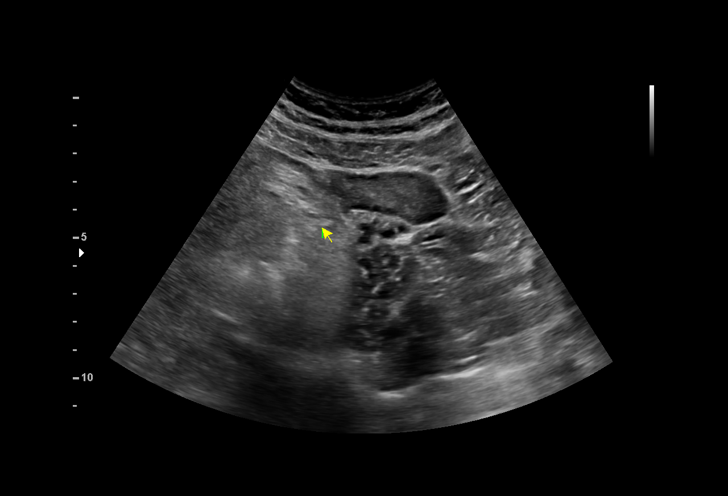
[im 25/25]
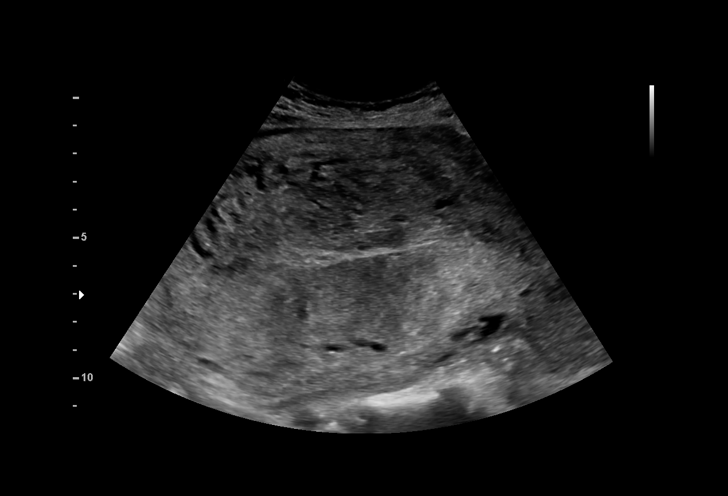

[15 of 25 positions shown; findings below may reference images not displayed]

FINDINGS: Uterus

Measurements: 19.2 x 8.5 x 11.0 cm = volume: 898 mL. No fibroids or
other mass visualized.

Endometrium

Thickness: 6 mm in thickness. No internal blood flow. No focal
abnormality visualized.

Right ovary

Measurements: 3.5 x 2.0 x 1.8 cm = volume: 6.3 mL. Normal
appearance/no adnexal mass.

Left ovary

Measurements: 3.1 x 2.2 x 4.0 cm = volume: 13.6 mL. Normal
appearance/no adnexal mass.

Other findings:  No abnormal free fluid.
IMPRESSION: Enlarged uterus compatible with recent postpartum state. No visible
evidence for retained products of conception.

## 2018-12-14 ENCOUNTER — Ambulatory Visit: Payer: 59

## 2018-12-28 ENCOUNTER — Ambulatory Visit (INDEPENDENT_AMBULATORY_CARE_PROVIDER_SITE_OTHER): Payer: 59 | Admitting: Family Medicine

## 2018-12-28 ENCOUNTER — Other Ambulatory Visit: Payer: Self-pay

## 2018-12-28 ENCOUNTER — Encounter: Payer: Self-pay | Admitting: Family Medicine

## 2018-12-28 VITALS — BP 112/68 | HR 67 | Temp 98.4°F | Ht 63.5 in | Wt 227.4 lb

## 2018-12-28 DIAGNOSIS — D62 Acute posthemorrhagic anemia: Secondary | ICD-10-CM | POA: Diagnosis not present

## 2018-12-28 DIAGNOSIS — E282 Polycystic ovarian syndrome: Secondary | ICD-10-CM | POA: Diagnosis not present

## 2018-12-28 DIAGNOSIS — M6208 Separation of muscle (nontraumatic), other site: Secondary | ICD-10-CM | POA: Diagnosis not present

## 2018-12-28 NOTE — Patient Instructions (Addendum)
  Keairalashae on instagram or youtube She does tabata workouts at home  Continue exercise   Follow up with Referral for General Surgery for Hernia Repair.   If you have lab work done today you will be contacted with your lab results within the next 2 weeks.  If you have not heard from [place] then please contact us. The fastest way to get your results is to register for My Chart.   IF you received an x-ray today, you will receive an invoice from [facility]. Please contact [facility] at [phone] with questions or concerns regarding your invoice.   IF you received labwork today, you will receive an invoice from [place]. Please contact [facility] at [phone] with questions or concerns regarding your invoice.   Our billing staff will not be able to assist you with questions regarding bills from these companies.  You will be contacted with the lab results as soon as they are available. The fastest way to get your results is to activate your My Chart account. Instructions are located on the last page of this paperwork. If you have not heard from [place] regarding the results in 2 weeks, please contact this office.

## 2018-12-28 NOTE — Progress Notes (Signed)
Established Patient Office Visit  Subjective:  Patient ID: Michelle Pacheco, female    DOB: Mar 31, 1985  Age: 33 y.o. MRN: [id number]  CC:  Chief Complaint  Patient presents with  . chronic conditions    6 m f/u  (From PT)  . check hernia    wants to know if surgery is still an option after PT    HPI Michelle Pacheco presents for   [employer]  She enjoyed PT which helped  She still has stomach irritation She tried abdominal taping and abdominal exercises She is still having bouts of abdominal pain and constipation   Wt Readings from Last 3 Encounters:  12/28/18 227 lb 6.4 oz (103.1 kg)  06/21/18 233 lb (105.7 kg)  04/25/18 227 lb 8 oz (103.2 kg)    Anemia: Patient presents for presents evaluation of anemia. Anemia was found by routine blood tests.  It has been present for a few years.  Associated signs & symptoms: none.  Lab Results  Component Value Date   WBC 4.8 12/28/2018   HGB 11.5 12/28/2018   HCT 36.5 12/28/2018   MCV 81 12/28/2018   PLT 339 12/28/2018   PCOS She is exercising and trying to lose weight She states that she has been able to keep the weight down during the pandemic  Wt Readings from Last 3 Encounters:  12/28/18 227 lb 6.4 oz (103.1 kg)  06/21/18 233 lb (105.7 kg)  04/25/18 227 lb 8 oz (103.2 kg)      Past Medical History:  Diagnosis Date  . Anemia   . Anxiety   . Blood transfusion without reported diagnosis   . Medical history non-contributory     Past Surgical History:  Procedure Laterality Date  . DILATION AND CURETTAGE OF UTERUS    . WISDOM TOOTH EXTRACTION      Family History  Problem Relation Age of Onset  . Multiple sclerosis Mother   . Diabetes Father   . Asthma Brother   . Diabetes Maternal Grandfather   . Hypertension Maternal Grandfather   . Prostate cancer Maternal Grandfather     Social History   Socioeconomic History  . Marital status: Married    Spouse name: Not on file  . Number of children: 2   . Years of education: Not on file  . Highest education level: Not on file  Occupational History  . Occupation: REGISTRATION    Employer: [employer]  Social Needs  . Financial resource strain: Not hard at all  . Food insecurity    Worry: Never true    Inability: Never true  . Transportation needs    Medical: No    Non-medical: No  Tobacco Use  . Smoking status: Former Smoker    Types: Cigarettes    Quit date: 09/07/2011    Years since quitting: 7.3  . Smokeless tobacco: Never Used  Substance and Sexual Activity  . Alcohol use: Yes    Comment: rarely  . Drug use: No  . Sexual activity: Yes    Partners: Male  Lifestyle  . Physical activity    Days per week: 2 days    Minutes per session: 30 min  . Stress: Not at all  Relationships  . Social Musician on phone: Three times a week    Gets together: Three times a week    Attends religious service: More than 4 times per year    Active member of club or organization:  Yes    Attends meetings of clubs or organizations: More than 4 times per year    Relationship status: Married  . Intimate partner violence    Fear of current or ex partner: No    Emotionally abused: No    Physically abused: No    Forced sexual activity: No  Other Topics Concern  . Not on file  Social History Narrative  . Not on file    Outpatient Medications Prior to Visit  Medication Sig Dispense Refill  . Cetirizine HCl (ZYRTEC PO) Take by mouth.    . ferrous sulfate 325 (65 FE) MG tablet Take 1 tablet (325 mg total) by mouth 2 (two) times daily with a meal. 30 tablet 3  . ibuprofen (ADVIL,MOTRIN) 600 MG tablet Take 1 tablet (600 mg total) by mouth every 6 (six) hours. 30 tablet 2  . Prenat w/o A-FeCbGl-DSS-FA-DHA (CITRANATAL 90 DHA) 90-1 & 300 MG MISC     . Prenatal Vit-Fe Fumarate-FA (PRENATAL VITAMIN PO) Take by mouth.     No facility-administered medications prior to visit.     Allergies  Allergen Reactions  .  Bioflavonoids Hives  . Peanuts [Peanut Oil] Swelling  . Azithromycin Diarrhea  . Citrus Hives  . Latex Hives  . Shellfish Allergy     ROS Review of Systems Review of Systems  Constitutional: Negative for activity change, appetite change, chills and fever.  HENT: Negative for congestion, nosebleeds, trouble swallowing and voice change.   Respiratory: Negative for cough, shortness of breath and wheezing.   Gastrointestinal: Negative for diarrhea, nausea and vomiting.  Genitourinary: Negative for difficulty urinating, dysuria, flank pain and hematuria.  Musculoskeletal: Negative for back pain, joint swelling and neck pain.  Neurological: Negative for dizziness, speech difficulty, light-headedness and numbness.  See HPI. All other review of systems negative.     Objective:    Physical Exam  BP 112/68   Pulse 67   Temp 98.4 F (36.9 C) (Oral)   Ht 5' 3.5" (1.613 m)   Wt 227 lb 6.4 oz (103.1 kg)   LMP 12/18/2018   SpO2 100%   BMI 39.65 kg/m  Wt Readings from Last 3 Encounters:  12/28/18 227 lb 6.4 oz (103.1 kg)  06/21/18 233 lb (105.7 kg)  04/25/18 227 lb 8 oz (103.2 kg)   Physical Exam  Constitutional: Oriented to person, place, and time. Appears well-developed and well-nourished.  HENT:  Head: Normocephalic and atraumatic.  Eyes: Conjunctivae and EOM are normal.  Cardiovascular: Normal rate, regular rhythm, normal heart sounds and intact distal pulses.  No murmur heard. Pulmonary/Chest: Effort normal and breath sounds normal. No stridor. No respiratory distress. Has no wheezes.  Abdomen: non-distended, normoactive bs, soft, nontender, large ventral wall defect Neurological: Is alert and oriented to person, place, and time.  Skin: Skin is warm. Capillary refill takes less than 2 seconds.  Psychiatric: Has a normal mood and affect. Behavior is normal. Judgment and thought content normal.    Health Maintenance Due  Topic Date Due  . PAP SMEAR-Modifier  03/09/2014  .  INFLUENZA VACCINE  09/15/2018    There are no preventive care reminders to display for this patient.  Lab Results  Component Value Date   TSH 1.900 06/19/2018   Lab Results  Component Value Date   WBC 4.8 12/28/2018   HGB 11.5 12/28/2018   HCT 36.5 12/28/2018   MCV 81 12/28/2018   PLT 339 12/28/2018   Lab Results  Component Value Date   NA  140 06/19/2018   K 4.3 06/19/2018   CO2 20 06/19/2018   GLUCOSE 86 06/19/2018   BUN 13 06/19/2018   CREATININE 0.90 06/19/2018   BILITOT 0.2 06/19/2018   ALKPHOS 80 06/19/2018   AST 19 06/19/2018   ALT 17 06/19/2018   PROT 7.4 06/19/2018   ALBUMIN 4.5 06/19/2018   CALCIUM 9.6 06/19/2018   ANIONGAP 7 12/26/2017   Lab Results  Component Value Date   CHOL 182 06/19/2018   Lab Results  Component Value Date   HDL 60 06/19/2018   Lab Results  Component Value Date   LDLCALC 107 (H) 06/19/2018   Lab Results  Component Value Date   TRIG 77 06/19/2018   Lab Results  Component Value Date   CHOLHDL 3.0 06/19/2018   Lab Results  Component Value Date   HGBA1C 5.4 12/28/2018      Assessment & Plan:   Problem List Items Addressed This Visit      Endocrine   PCOS (polycystic ovarian syndrome)  -  Discussed diabetes prevention   Relevant Orders   Hemoglobin A1c (Completed)     Other   Acute blood loss anemia - Primary No further anemia   Relevant Orders   CBC (Completed)    Other Visit Diagnoses    Diastasis of rectus abdominis    -  Advised evaluation by General Surgery   Relevant Orders   Ambulatory referral to General Surgery      No orders of the defined types were placed in this encounter.   Follow-up: No follow-ups on file.    Doristine BosworthZoe A Kento Gossman, MD

## 2018-12-29 LAB — CBC
Hematocrit: 36.5 % (ref 34.0–46.6)
Hemoglobin: 11.5 g/dL (ref 11.1–15.9)
MCH: 25.6 pg — ABNORMAL LOW (ref 26.6–33.0)
MCHC: 31.5 g/dL (ref 31.5–35.7)
MCV: 81 fL (ref 79–97)
Platelets: 339 10*3/uL (ref 150–450)
RBC: 4.49 x10E6/uL (ref 3.77–5.28)
RDW: 13.8 % (ref 11.7–15.4)
WBC: 4.8 10*3/uL (ref 3.4–10.8)

## 2018-12-29 LAB — HEMOGLOBIN A1C
Est. average glucose Bld gHb Est-mCnc: 108 mg/dL
Hgb A1c MFr Bld: 5.4 % (ref 4.8–5.6)

## 2019-01-21 ENCOUNTER — Telehealth: Payer: Self-pay

## 2019-01-21 NOTE — Telephone Encounter (Signed)
Pt. Called wanting to let Dr. Nolon Rod know she developed nausea over the weekend. No exposure to covid, negative pregnancy test. Pt. Advised it likely she needed to be seen.   Pt. Looking for advice, will schedule if necessary

## 2019-01-22 NOTE — Telephone Encounter (Signed)
Spoke with pt she advises she continues to pass blood with bm's.  Pt seen on 12/28/2018.  I advised I would send message to dr Nolon Rod re: her concerns and will call her back with her recommendations or to schedule appt for her if she deems it necessary.  Pt agreeable. Dgaddy, CMA

## 2019-01-31 DIAGNOSIS — H52203 Unspecified astigmatism, bilateral: Secondary | ICD-10-CM | POA: Diagnosis not present

## 2019-01-31 DIAGNOSIS — H5213 Myopia, bilateral: Secondary | ICD-10-CM | POA: Diagnosis not present

## 2019-01-31 DIAGNOSIS — Z135 Encounter for screening for eye and ear disorders: Secondary | ICD-10-CM | POA: Diagnosis not present

## 2019-02-15 NOTE — L&D Delivery Note (Addendum)
.  Delivery Note Labor onset: 10/17/2019  Labor Onset Time: 1300 Complete dilation at 8:15 PM  FHR second stage Cat 1 Analgesia/Anesthesia intrapartum: Unmedicated  Strong maternal urge, delivery of viable female w/ force of one contraction. Fetal head delivered in LOA position. Nuchal cord x1, loose, reduced. Father allowed to assist w/ birth. Infant placed on maternal abd, dried, and tactile stim.  Cord double clamped after conclusion of pulsation and cut by father. Father and doula present for birth. Cord blood sample collected Yes Arterial cord blood sample N/A.  Placenta delivered Tomasa Blase, intact, with 3 VC.  Placenta to patient. Uterine tone firm, bleeding small, no clots  No laceration identified.  Anesthesia: unmedicated Repair: N/A EBL (mL): 100 Complications: none APGAR: APGAR (1 MIN):  9 APGAR (5 MINS):  9 APGAR (10 MINS):   Mom to postpartum.  Baby to Couplet care / Skin to Skin.  Inadequate GBS prophylaxis.   Roma Schanz MSN, CNM 10/17/2019, 9:04 PM

## 2019-02-27 ENCOUNTER — Ambulatory Visit (INDEPENDENT_AMBULATORY_CARE_PROVIDER_SITE_OTHER): Payer: 59 | Admitting: Family Medicine

## 2019-02-27 ENCOUNTER — Other Ambulatory Visit: Payer: Self-pay

## 2019-02-27 VITALS — BP 122/79 | HR 72 | Temp 98.2°F | Resp 17 | Ht 63.5 in | Wt 228.2 lb

## 2019-02-27 DIAGNOSIS — O210 Mild hyperemesis gravidarum: Secondary | ICD-10-CM

## 2019-02-27 DIAGNOSIS — Z641 Problems related to multiparity: Secondary | ICD-10-CM

## 2019-02-27 LAB — POCT URINE PREGNANCY: Preg Test, Ur: POSITIVE — AB

## 2019-02-27 NOTE — Progress Notes (Signed)
Established Patient Office Visit  Subjective:  Patient ID: Michelle Pacheco, female    DOB: [birth year]  Age: 34 y.o. MRN: [id number]  CC:  Chief Complaint  Patient presents with  . Nausea    x 1 month, intermittent, 2 pregnancy that were negative and took 1 preg test and+, not sure anxiety from family traumatic issues during holiday seasons may be triggering anxiety and causing nausea.  In Nov. had 2 cycles that month.  Referral gyn    HPI Michelle Pacheco presents for   Patient reports that she has been having some stress and anxiety  She stats that she is in between [facility]. She would like to see someone else.   She reports that she had vaginal delivery and had postpartum hemorrhage.  She states that she had retained placenta and it was manually removed at the bedside.   She states that she has a history of PCOS She reports that she also had 2 menstrual cycles in November LMP 01/10/2019 She uses condoms for contraception She states that her youngest child turned 41 months She would like to see Gynecology [phone]      Past Medical History:  Diagnosis Date  . Anemia   . Anxiety   . Blood transfusion without reported diagnosis   . Medical history non-contributory     Past Surgical History:  Procedure Laterality Date  . DILATION AND CURETTAGE OF UTERUS    . WISDOM TOOTH EXTRACTION      Family History  Problem Relation Age of Onset  . Multiple sclerosis Mother   . Diabetes Father   . Asthma Brother   . Diabetes Maternal Grandfather   . Hypertension Maternal Grandfather   . Prostate cancer Maternal Grandfather     Social History   Socioeconomic History  . Marital status: Married    Spouse name: Not on file  . Number of children: 2  . Years of education: Not on file  . Highest education level: Not on file  Occupational History  . Occupation: REGISTRATION    Employer: [employer]  Tobacco Use  . Smoking status: Former Smoker    Types:  Cigarettes    Quit date: 09/07/2011    Years since quitting: 7.4  . Smokeless tobacco: Never Used  Substance and Sexual Activity  . Alcohol use: Yes    Comment: rarely  . Drug use: No  . Sexual activity: Yes    Partners: Male  Other Topics Concern  . Not on file  Social History Narrative  . Not on file   Social Determinants of Health   Financial Resource Strain:   . Difficulty of Paying Living Expenses: Not on file  Food Insecurity:   . Worried About Charity fundraiser in the Last Year: Not on file  . Ran Out of Food in the Last Year: Not on file  Transportation Needs:   . Lack of Transportation (Medical): Not on file  . Lack of Transportation (Non-Medical): Not on file  Physical Activity:   . Days of Exercise per Week: Not on file  . Minutes of Exercise per Session: Not on file  Stress:   . Feeling of Stress : Not on file  Social Connections:   . Frequency of Communication with Friends and Family: Not on file  . Frequency of Social Gatherings with Friends and Family: Not on file  . Attends Religious Services: Not on file  . Active Member of Clubs or Organizations: Not on file  .  Attends Banker Meetings: Not on file  . Marital Status: Not on file  Intimate Partner Violence:   . Fear of Current or Ex-Partner: Not on file  . Emotionally Abused: Not on file  . Physically Abused: Not on file  . Sexually Abused: Not on file    Outpatient Medications Prior to Visit  Medication Sig Dispense Refill  . Cetirizine HCl (ZYRTEC PO) Take by mouth.    . ferrous sulfate 325 (65 FE) MG tablet Take 1 tablet (325 mg total) by mouth 2 (two) times daily with a meal. 30 tablet 3  . ibuprofen (ADVIL,MOTRIN) 600 MG tablet Take 1 tablet (600 mg total) by mouth every 6 (six) hours. 30 tablet 2  . Prenat w/o A-FeCbGl-DSS-FA-DHA (CITRANATAL 90 DHA) 90-1 & 300 MG MISC     . Prenatal Vit-Fe Fumarate-FA (PRENATAL VITAMIN PO) Take by mouth.     No facility-administered medications  prior to visit.    Allergies  Allergen Reactions  . Bioflavonoids Hives  . Peanuts [Peanut Oil] Swelling  . Azithromycin Diarrhea  . Citrus Hives  . Latex Hives  . Shellfish Allergy     ROS Review of Systems Review of Systems  Constitutional: Negative for activity change, appetite change, chills and fever.  HENT: Negative for congestion, nosebleeds, trouble swallowing and voice change.   Respiratory: Negative for cough, shortness of breath and wheezing.   Gastrointestinal: Negative for diarrhea, nausea and vomiting.  Genitourinary: Negative for difficulty urinating, dysuria, flank pain and hematuria.  Musculoskeletal: Negative for back pain, joint swelling and neck pain.  Neurological: Negative for dizziness, speech difficulty, light-headedness and numbness.  See HPI. All other review of systems negative.     Objective:    Physical Exam  BP 122/79 (BP Location: Right Arm, Patient Position: Sitting, Cuff Size: Large)   Pulse 72   Temp 98.2 F (36.8 C) (Oral)   Resp 17   Ht 5' 3.5" (1.613 m)   Wt 228 lb 3.2 oz (103.5 kg)   LMP 01/01/2019   SpO2 100%   BMI 39.79 kg/m  Wt Readings from Last 3 Encounters:  02/27/19 228 lb 3.2 oz (103.5 kg)  12/28/18 227 lb 6.4 oz (103.1 kg)  06/21/18 233 lb (105.7 kg)   Physical Exam  Constitutional: Oriented to person, place, and time. Appears well-developed and well-nourished.  HENT:  Head: Normocephalic and atraumatic.  Eyes: Conjunctivae and EOM are normal.  Cardiovascular: Normal rate, regular rhythm, normal heart sounds and intact distal pulses.  No murmur heard. Pulmonary/Chest: Effort normal and breath sounds normal. No stridor. No respiratory distress. Has no wheezes.  Neurological: Is alert and oriented to person, place, and time.  Skin: Skin is warm. Capillary refill takes less than 2 seconds.  Psychiatric: Has a normal mood and affect. Behavior is normal. Judgment and thought content normal.    Health Maintenance Due    Topic Date Due  . PAP SMEAR-Modifier  03/09/2014  . INFLUENZA VACCINE  09/15/2018    There are no preventive care reminders to display for this patient.  Lab Results  Component Value Date   TSH 1.900 06/19/2018   Lab Results  Component Value Date   WBC 4.8 12/28/2018   HGB 11.5 12/28/2018   HCT 36.5 12/28/2018   MCV 81 12/28/2018   PLT 339 12/28/2018   Lab Results  Component Value Date   NA 140 06/19/2018   K 4.3 06/19/2018   CO2 20 06/19/2018   GLUCOSE 86 06/19/2018  BUN 13 06/19/2018   CREATININE 0.90 06/19/2018   BILITOT 0.2 06/19/2018   ALKPHOS 80 06/19/2018   AST 19 06/19/2018   ALT 17 06/19/2018   PROT 7.4 06/19/2018   ALBUMIN 4.5 06/19/2018   CALCIUM 9.6 06/19/2018   ANIONGAP 7 12/26/2017   Lab Results  Component Value Date   CHOL 182 06/19/2018   Lab Results  Component Value Date   HDL 60 06/19/2018   Lab Results  Component Value Date   LDLCALC 107 (H) 06/19/2018   Lab Results  Component Value Date   TRIG 77 06/19/2018   Lab Results  Component Value Date   CHOLHDL 3.0 06/19/2018   Lab Results  Component Value Date   HGBA1C 5.4 12/28/2018      Assessment & Plan:   Problem List Items Addressed This Visit    None    Visit Diagnoses    Morning sickness    -  Primary   Relevant Orders   POCT urine pregnancy (Completed)   Ambulatory referral to Obstetrics / Gynecology   Multiparity       Relevant Orders   Ambulatory referral to Obstetrics / Gynecology     Discussed referral to OB/GYN Start multivitamin   No orders of the defined types were placed in this encounter.   Follow-up: Return if symptoms worsen or fail to improve.    Doristine Bosworth, MD

## 2019-02-28 ENCOUNTER — Telehealth: Payer: Self-pay | Admitting: Family Medicine

## 2019-02-28 NOTE — Telephone Encounter (Signed)
Copied from CRM [id number]. Topic: Referral - Status >> Feb 28, 2019  8:40 AM Reggie Pile, NT wrote: Reason for CRM: [facility] called in to notify office they cannot see patient as they are full.  Please advise.

## 2019-03-03 ENCOUNTER — Encounter: Payer: Self-pay | Admitting: Family Medicine

## 2019-03-05 MED FILL — CITRANATAL 90 DHA COMBO PAC: 90-1 & 300 | 30 days supply | Qty: 60 | Fill #2

## 2019-03-07 ENCOUNTER — Telehealth: Payer: Self-pay | Admitting: Family Medicine

## 2019-03-07 NOTE — Telephone Encounter (Signed)
Patient is calling needing to know status of referral looks like we may be waiting on a signature for new provider since original office no longer taking new patients . Please reach out to patient regarding her referral  [phone] Patient is wondering if she needs to do some legwork on her own

## 2019-03-11 NOTE — Telephone Encounter (Signed)
Please assist with message below and follow up on referral.

## 2019-03-20 DIAGNOSIS — O26851 Spotting complicating pregnancy, first trimester: Secondary | ICD-10-CM | POA: Diagnosis not present

## 2019-03-20 DIAGNOSIS — Z3201 Encounter for pregnancy test, result positive: Secondary | ICD-10-CM | POA: Diagnosis not present

## 2019-03-20 DIAGNOSIS — O26899 Other specified pregnancy related conditions, unspecified trimester: Secondary | ICD-10-CM | POA: Diagnosis not present

## 2019-03-20 DIAGNOSIS — O09521 Supervision of elderly multigravida, first trimester: Secondary | ICD-10-CM | POA: Diagnosis not present

## 2019-03-20 DIAGNOSIS — Z3480 Encounter for supervision of other normal pregnancy, unspecified trimester: Secondary | ICD-10-CM | POA: Diagnosis not present

## 2019-03-20 DIAGNOSIS — O09291 Supervision of pregnancy with other poor reproductive or obstetric history, first trimester: Secondary | ICD-10-CM | POA: Diagnosis not present

## 2019-03-27 DIAGNOSIS — N898 Other specified noninflammatory disorders of vagina: Secondary | ICD-10-CM | POA: Diagnosis not present

## 2019-03-27 DIAGNOSIS — Z3481 Encounter for supervision of other normal pregnancy, first trimester: Secondary | ICD-10-CM | POA: Diagnosis not present

## 2019-03-27 DIAGNOSIS — Z862 Personal history of diseases of the blood and blood-forming organs and certain disorders involving the immune mechanism: Secondary | ICD-10-CM | POA: Diagnosis not present

## 2019-03-27 DIAGNOSIS — O09529 Supervision of elderly multigravida, unspecified trimester: Secondary | ICD-10-CM | POA: Diagnosis not present

## 2019-03-27 DIAGNOSIS — Z3490 Encounter for supervision of normal pregnancy, unspecified, unspecified trimester: Secondary | ICD-10-CM | POA: Diagnosis not present

## 2019-04-09 NOTE — Telephone Encounter (Signed)
Can you see if this patient has any further symptoms? She should discuss this with her OB/GYN but would like to ensure she was able to talk with someone.

## 2019-04-10 DIAGNOSIS — O209 Hemorrhage in early pregnancy, unspecified: Secondary | ICD-10-CM | POA: Diagnosis not present

## 2019-04-10 DIAGNOSIS — Z3A12 12 weeks gestation of pregnancy: Secondary | ICD-10-CM | POA: Diagnosis not present

## 2019-04-10 DIAGNOSIS — O09521 Supervision of elderly multigravida, first trimester: Secondary | ICD-10-CM | POA: Diagnosis not present

## 2019-04-10 DIAGNOSIS — O200 Threatened abortion: Secondary | ICD-10-CM | POA: Diagnosis not present

## 2019-04-10 NOTE — Telephone Encounter (Signed)
Patient confirmed she found out she was pregnant. But have some concerns she would like to talk with Dr. Creta Levin about schedule her a office visit

## 2019-04-17 ENCOUNTER — Ambulatory Visit: Payer: Self-pay | Admitting: Family Medicine

## 2019-05-01 MED FILL — CITRANATAL 90 DHA COMBO PAC: 90-1 & 300 | 30 days supply | Qty: 60 | Fill #3

## 2019-05-22 DIAGNOSIS — Z3482 Encounter for supervision of other normal pregnancy, second trimester: Secondary | ICD-10-CM | POA: Diagnosis not present

## 2019-05-22 DIAGNOSIS — O09522 Supervision of elderly multigravida, second trimester: Secondary | ICD-10-CM | POA: Diagnosis not present

## 2019-05-22 DIAGNOSIS — Z3490 Encounter for supervision of normal pregnancy, unspecified, unspecified trimester: Secondary | ICD-10-CM | POA: Diagnosis not present

## 2019-05-22 DIAGNOSIS — Z36 Encounter for antenatal screening for chromosomal anomalies: Secondary | ICD-10-CM | POA: Diagnosis not present

## 2019-05-27 DIAGNOSIS — R197 Diarrhea, unspecified: Secondary | ICD-10-CM | POA: Diagnosis not present

## 2019-06-04 MED FILL — CITRANATAL 90 DHA COMBO PAC: 90-1 & 300 | 30 days supply | Qty: 60 | Fill #4

## 2019-06-07 DIAGNOSIS — L853 Xerosis cutis: Secondary | ICD-10-CM | POA: Diagnosis not present

## 2019-06-07 DIAGNOSIS — L810 Postinflammatory hyperpigmentation: Secondary | ICD-10-CM | POA: Diagnosis not present

## 2019-06-19 DIAGNOSIS — Z3482 Encounter for supervision of other normal pregnancy, second trimester: Secondary | ICD-10-CM | POA: Diagnosis not present

## 2019-06-19 DIAGNOSIS — N760 Acute vaginitis: Secondary | ICD-10-CM | POA: Diagnosis not present

## 2019-07-17 DIAGNOSIS — Z3490 Encounter for supervision of normal pregnancy, unspecified, unspecified trimester: Secondary | ICD-10-CM | POA: Diagnosis not present

## 2019-07-17 DIAGNOSIS — Z3482 Encounter for supervision of other normal pregnancy, second trimester: Secondary | ICD-10-CM | POA: Diagnosis not present

## 2019-07-24 ENCOUNTER — Other Ambulatory Visit (HOSPITAL_BASED_OUTPATIENT_CLINIC_OR_DEPARTMENT_OTHER): Payer: Self-pay | Admitting: Obstetrics and Gynecology

## 2019-08-05 DIAGNOSIS — Z3480 Encounter for supervision of other normal pregnancy, unspecified trimester: Secondary | ICD-10-CM | POA: Diagnosis not present

## 2019-08-29 MED FILL — CITRANATAL 90 DHA COMBO PAC: 90-1 & 300 | 30 days supply | Qty: 60 | Fill #1

## 2019-10-02 DIAGNOSIS — Z3A37 37 weeks gestation of pregnancy: Secondary | ICD-10-CM | POA: Diagnosis not present

## 2019-10-02 DIAGNOSIS — O99213 Obesity complicating pregnancy, third trimester: Secondary | ICD-10-CM | POA: Diagnosis not present

## 2019-10-02 DIAGNOSIS — O09523 Supervision of elderly multigravida, third trimester: Secondary | ICD-10-CM | POA: Diagnosis not present

## 2019-10-07 MED FILL — CITRANATAL 90 DHA COMBO PAC: 90-1 & 300 | 30 days supply | Qty: 60 | Fill #2

## 2019-10-17 ENCOUNTER — Encounter (HOSPITAL_COMMUNITY): Payer: Self-pay | Admitting: Obstetrics & Gynecology

## 2019-10-17 ENCOUNTER — Other Ambulatory Visit: Payer: Self-pay

## 2019-10-17 ENCOUNTER — Inpatient Hospital Stay (HOSPITAL_COMMUNITY)
Admission: AD | Admit: 2019-10-17 | Discharge: 2019-10-19 | DRG: 807 | Disposition: A | Discharge status: Home / Self Care | Payer: 59 | Attending: Obstetrics and Gynecology | Admitting: Obstetrics and Gynecology

## 2019-10-17 DIAGNOSIS — Z20822 Contact with and (suspected) exposure to covid-19: Secondary | ICD-10-CM | POA: Diagnosis present

## 2019-10-17 DIAGNOSIS — O99824 Streptococcus B carrier state complicating childbirth: Secondary | ICD-10-CM | POA: Diagnosis present

## 2019-10-17 DIAGNOSIS — Z3A39 39 weeks gestation of pregnancy: Secondary | ICD-10-CM | POA: Diagnosis not present

## 2019-10-17 DIAGNOSIS — Z87891 Personal history of nicotine dependence: Secondary | ICD-10-CM | POA: Diagnosis not present

## 2019-10-17 DIAGNOSIS — O26893 Other specified pregnancy related conditions, third trimester: Secondary | ICD-10-CM | POA: Diagnosis present

## 2019-10-17 LAB — CBC
HCT: 40.9 % (ref 36.0–46.0)
Hemoglobin: 13.1 g/dL (ref 12.0–15.0)
MCH: 27.6 pg (ref 26.0–34.0)
MCHC: 32 g/dL (ref 30.0–36.0)
MCV: 83.3 fL (ref 80.0–100.0)
Platelets: 270 10*3/uL (ref 150–400)
RBC: 4.74 MIL/uL (ref 3.87–5.11)
RDW: 14.5 % (ref 11.5–15.5)
WBC: 7.6 10*3/uL (ref 4.0–10.5)
nRBC: 0 % (ref 0.0–0.2)

## 2019-10-17 LAB — TYPE AND SCREEN
ABO/RH(D): AB POS
Antibody Screen: NEGATIVE

## 2019-10-17 LAB — SARS CORONAVIRUS 2 BY RT PCR (HOSPITAL ORDER, PERFORMED IN ~~LOC~~ HOSPITAL LAB): SARS Coronavirus 2: NEGATIVE

## 2019-10-17 MED ORDER — SOD CITRATE-CITRIC ACID 500-334 MG/5ML PO SOLN: 30.0000 mL | ORAL | Status: DC | PRN

## 2019-10-17 MED ORDER — LIDOCAINE HCL (PF) 1 % IJ SOLN: 30.0000 mL | INTRAMUSCULAR | Status: DC | PRN

## 2019-10-17 MED ORDER — ACETAMINOPHEN 325 MG PO TABS: 650.0000 mg | ORAL_TABLET | ORAL | Status: DC | PRN

## 2019-10-17 MED ORDER — SODIUM CHLORIDE 0.9 % IV SOLN
5.0000 10*6.[IU] | Freq: Once | INTRAVENOUS | Status: DC
  Filled 2019-10-17: qty 5

## 2019-10-17 MED ORDER — LACTATED RINGERS IV SOLN: 500.0000 mL | INTRAVENOUS | Status: DC | PRN

## 2019-10-17 MED ORDER — HYDROXYZINE HCL 50 MG PO TABS: 50.0000 mg | ORAL_TABLET | Freq: Four times a day (QID) | ORAL | Status: DC | PRN

## 2019-10-17 MED ORDER — LACTATED RINGERS IV SOLN: INTRAVENOUS | Status: DC

## 2019-10-17 MED ORDER — ONDANSETRON HCL 4 MG/2ML IJ SOLN: 4.0000 mg | Freq: Four times a day (QID) | INTRAMUSCULAR | Status: DC | PRN

## 2019-10-17 MED ORDER — PENICILLIN G POT IN DEXTROSE 60000 UNIT/ML IV SOLN: 3.0000 10*6.[IU] | INTRAVENOUS | Status: DC

## 2019-10-17 MED ORDER — OXYTOCIN BOLUS FROM INFUSION
333.0000 mL | Freq: Once | INTRAVENOUS | Status: AC
  Administered 2019-10-17: 333 mL via INTRAVENOUS

## 2019-10-17 MED ORDER — OXYCODONE-ACETAMINOPHEN 5-325 MG PO TABS: 1.0000 | ORAL_TABLET | ORAL | Status: DC | PRN

## 2019-10-17 MED ORDER — SODIUM CHLORIDE 0.9 % IV SOLN
5.0000 10*6.[IU] | Freq: Once | INTRAVENOUS | Status: AC
  Administered 2019-10-17: 5 10*6.[IU] via INTRAVENOUS
  Filled 2019-10-17: qty 5

## 2019-10-17 MED ORDER — OXYTOCIN-SODIUM CHLORIDE 30-0.9 UT/500ML-% IV SOLN
2.5000 [IU]/h | INTRAVENOUS | Status: DC
  Administered 2019-10-17: 2.5 [IU]/h via INTRAVENOUS
  Filled 2019-10-17: qty 500

## 2019-10-17 MED ORDER — FENTANYL CITRATE (PF) 100 MCG/2ML IJ SOLN: 50.0000 ug | INTRAMUSCULAR | Status: DC | PRN

## 2019-10-17 MED ORDER — MISOPROSTOL 200 MCG PO TABS
ORAL_TABLET | ORAL | Status: AC
  Filled 2019-10-17: qty 5

## 2019-10-17 MED ORDER — OXYCODONE-ACETAMINOPHEN 5-325 MG PO TABS: 2.0000 | ORAL_TABLET | ORAL | Status: DC | PRN

## 2019-10-17 NOTE — H&P (Signed)
Michelle Pacheco is a 34 y.o. female G6 P3023 at 39 wks and 6 days presenting complaining of contractions every 3 minutes for the last 2 hours. Cervix 4 cm on exam in MAU. Pregnancy has been uncomplicated. Pt had a PPH with previous pregnancy. Prenatal care provided by Dr. Gerald Leitz with [facility].  U/S for efw 10/02/2019 was 5 lbs 9 oz (16 %ile ) . OB History    Gravida  6   Para  3   Term  3   Preterm  0   AB  2   Living  3     SAB      TAB      Ectopic      Multiple  0   Live Births  3          Past Medical History:  Diagnosis Date  . Anemia   . Anxiety   . Blood transfusion without reported diagnosis    x2  . Medical history non-contributory    Past Surgical History:  Procedure Laterality Date  . DILATION AND CURETTAGE OF UTERUS    . WISDOM TOOTH EXTRACTION     Family History: family history includes Asthma in her brother; Diabetes in her father and maternal grandfather; Hypertension in her maternal grandfather; Multiple sclerosis in her mother; Prostate cancer in her maternal grandfather. Social History:  reports that she quit smoking about 8 years ago. Her smoking use included cigarettes. She has never used smokeless tobacco. She reports current alcohol use. She reports that she does not use drugs.     Maternal Diabetes: No Genetic Screening: Normal Maternal Ultrasounds/Referrals: Normal Fetal Ultrasounds or other Referrals:  None Maternal Substance Abuse:  No Significant Maternal Medications:  None Significant Maternal Lab Results:  Group B Strep positive Other Comments:  None  Review of Systems  Constitutional: Negative.   HENT: Negative.   Eyes: Negative.   Respiratory: Negative.   Cardiovascular: Negative.   Gastrointestinal: Negative.   Endocrine: Negative.   Genitourinary: Negative.   Musculoskeletal: Negative.   Allergic/Immunologic: Negative.   Neurological: Negative.   Hematological: Negative.   Psychiatric/Behavioral: Negative.     History Dilation: 4 Effacement (%): 70 Station: -2 Exam by:: F. Morris RN Blood pressure 132/67, pulse 80, temperature 98.1 F (36.7 C), resp. rate 16, height 5\' 3"  (1.6 m), weight 114.8 kg, last menstrual period 01/01/2019, SpO2 100 %, unknown if currently breastfeeding. Maternal Exam:  Introitus: Normal vulva.   Physical Exam Vitals reviewed.  Constitutional:      Appearance: Normal appearance.  HENT:     Head: Normocephalic.     Nose: Nose normal.  Cardiovascular:     Rate and Rhythm: Normal rate and regular rhythm.     Pulses: Normal pulses.  Pulmonary:     Effort: Pulmonary effort is normal.     Breath sounds: Normal breath sounds.  Abdominal:     Tenderness: There is abdominal tenderness.  Genitourinary:    General: Normal vulva.  Musculoskeletal:        General: Swelling present. Normal range of motion.     Cervical back: Normal range of motion and neck supple.  Skin:    General: Skin is warm and dry.  Neurological:     General: No focal deficit present.     Mental Status: She is alert and oriented to person, place, and time.  Psychiatric:        Mood and Affect: Mood normal.  Behavior: Behavior normal.     Prenatal labs: ABO, Rh:  AB positive   Antibody:  Negative  Rubella:  Immune  RPR:   Nonreactive  HBsAg:  Negative   HIV:   Negative  GBS:   Positive   Assessment/Plan: 39 weeks and 6 days in active labor  Admitt to Labor and Delivery - expectant management  GBS prophylaxis- Penicillin  H/o PPH with previous delivery - will monitor closely.  CCOBMW and Dr. Su Hilt covering after 7 pm.    Gerald Leitz 10/17/2019, 5:37 PM

## 2019-10-17 NOTE — MAU Note (Signed)
. °  Michelle Pacheco is a 34 y.o. at [redacted]w[redacted]d here in MAU reporting: contractions that started a 1pm today. Denies any VB or LOF. +FM  Onset of complaint: 1pm Pain score: 8 Vitals:   10/17/19 1635 10/17/19 1636  BP:  132/67  Pulse:  80  Resp: 16   Temp:       FHT:135 Lab orders placed from triage:

## 2019-10-18 ENCOUNTER — Inpatient Hospital Stay (HOSPITAL_COMMUNITY): Admission: RE | Admit: 2019-10-18 | Payer: 59 | Source: Home / Self Care

## 2019-10-18 LAB — CBC
HCT: 35.1 % — ABNORMAL LOW (ref 36.0–46.0)
Hemoglobin: 11.1 g/dL — ABNORMAL LOW (ref 12.0–15.0)
MCH: 26.8 pg (ref 26.0–34.0)
MCHC: 31.6 g/dL (ref 30.0–36.0)
MCV: 84.8 fL (ref 80.0–100.0)
Platelets: 230 10*3/uL (ref 150–400)
RBC: 4.14 MIL/uL (ref 3.87–5.11)
RDW: 14.4 % (ref 11.5–15.5)
WBC: 10.2 10*3/uL (ref 4.0–10.5)
nRBC: 0 % (ref 0.0–0.2)

## 2019-10-18 LAB — RPR: RPR Ser Ql: NONREACTIVE

## 2019-10-18 MED ORDER — PRENATAL MULTIVITAMIN CH
1.0000 | ORAL_TABLET | Freq: Every day | ORAL | Status: DC
  Administered 2019-10-18: 1 via ORAL
  Filled 2019-10-18 (×2): qty 1

## 2019-10-18 MED ORDER — ZOLPIDEM TARTRATE 5 MG PO TABS: 5.0000 mg | ORAL_TABLET | Freq: Every evening | ORAL | Status: DC | PRN

## 2019-10-18 MED ORDER — DIPHENHYDRAMINE HCL 25 MG PO CAPS: 25.0000 mg | ORAL_CAPSULE | Freq: Four times a day (QID) | ORAL | Status: DC | PRN

## 2019-10-18 MED ORDER — SENNOSIDES-DOCUSATE SODIUM 8.6-50 MG PO TABS
2.0000 | ORAL_TABLET | ORAL | Status: DC
  Administered 2019-10-18: 2 via ORAL
  Filled 2019-10-18: qty 2

## 2019-10-18 MED ORDER — ONDANSETRON HCL 4 MG PO TABS: 4.0000 mg | ORAL_TABLET | ORAL | Status: DC | PRN

## 2019-10-18 MED ORDER — WITCH HAZEL-GLYCERIN EX PADS: 1.0000 "application " | MEDICATED_PAD | CUTANEOUS | Status: DC | PRN

## 2019-10-18 MED ORDER — SIMETHICONE 80 MG PO CHEW: 80.0000 mg | CHEWABLE_TABLET | ORAL | Status: DC | PRN

## 2019-10-18 MED ORDER — BENZOCAINE-MENTHOL 20-0.5 % EX AERO: 1.0000 "application " | INHALATION_SPRAY | CUTANEOUS | Status: DC | PRN

## 2019-10-18 MED ORDER — TETANUS-DIPHTH-ACELL PERTUSSIS 5-2.5-18.5 LF-MCG/0.5 IM SUSP: 0.5000 mL | Freq: Once | INTRAMUSCULAR | Status: DC

## 2019-10-18 MED ORDER — COCONUT OIL OIL
1.0000 "application " | TOPICAL_OIL | Status: DC | PRN
  Administered 2019-10-18: 1 via TOPICAL

## 2019-10-18 MED ORDER — DIBUCAINE (PERIANAL) 1 % EX OINT: 1.0000 "application " | TOPICAL_OINTMENT | CUTANEOUS | Status: DC | PRN

## 2019-10-18 MED ORDER — ACETAMINOPHEN 325 MG PO TABS: 650.0000 mg | ORAL_TABLET | ORAL | Status: DC | PRN

## 2019-10-18 MED ORDER — ONDANSETRON HCL 4 MG/2ML IJ SOLN: 4.0000 mg | INTRAMUSCULAR | Status: DC | PRN

## 2019-10-18 MED ORDER — IBUPROFEN 600 MG PO TABS
600.0000 mg | ORAL_TABLET | Freq: Four times a day (QID) | ORAL | Status: DC
  Administered 2019-10-18 – 2019-10-19 (×6): 600 mg via ORAL
  Filled 2019-10-18 (×6): qty 1

## 2019-10-18 NOTE — Progress Notes (Signed)
Postpartum Note Day #1 s/p SVD  S:  Patient resting comfortable in bed.  Pain controlled.  Tolerating regular diet. No flatus, no BM.  Lochia minimal.  Ambulating without difficulty.  She denies n/v/f/c, SOB, or CP.  Pt plans on breastfeeding.  States her husband plans on vasectomy.  O: Temp:  [97.9 F (36.6 C)-98.2 F (36.8 C)] 98.1 F (36.7 C) (09/03 0600) Pulse Rate:  [75-104] 80 (09/03 0600) Resp:  [16-20] 18 (09/03 0600) BP: (116-132)/(46-79) 124/70 (09/03 0600) SpO2:  [99 %-100 %] 100 % (09/03 0600) Weight:  [114.8 kg] 114.8 kg (09/02 1822) Gen: A&Ox3, NAD Resp:  No increased work of breathing Abdomen: soft, NT, ND Uterus: firm, non-tender, below umbilicus Ext: No edema, no calf tenderness bilaterally, SCDs in place  Labs:  Recent Labs    10/17/19 1745  HGB 13.1    A/P: Pt is a 34 y.o. [id number] s/p SVD  -Pain well controlled -GU: UOP is adequate -GI: Tolerating general diet -Activity: encouraged sitting up to chair and ambulation as tolerated -Prophylaxis: SCDs -Labs: stable as above -Desires discharge home today if infant discharged, otherwise discharge home tomorrow  Steva Ready, DO [phone] (office)

## 2019-10-18 NOTE — Progress Notes (Signed)
MOB was referred for history of depression/anxiety. * Referral screened out by Clinical Social Worker because none of the following criteria appear to apply: ~ History of anxiety/depression during this pregnancy, or of post-partum depression following prior delivery. ~ Diagnosis of anxiety and/or depression within last 3 years OR * MOB's symptoms currently being treated with medication and/or therapy.    Please contact the Clinical Social Worker if needs arise, by MOB request, or if MOB scores greater than 9/yes to question 10 on Edinburgh Postpartum Depression Screen.   Michelle Pacheco S. Michelle Pacheco, MSW, LCSW [facility] at  [phone]    

## 2019-10-18 NOTE — Lactation Note (Signed)
This note was copied from a baby's chart. Lactation Consultation Note  Patient Name: Michelle Pacheco [id number] Date: 10/18/2019 Reason for consult: Initial assessment;Term  Upon visiting mother, she informed me that she does not desire a lactation consultation.  She stated her daughter has been latching and feeding well since delivery.  Mother is a Producer, television/film/video, however, and I offered to obtain a DEBP for her.  She was uncertain as to the specific pump she wanted and I provided the choices.  Mother will research the options and call her RN when she has made her decision.  RN updated.   Maternal Data    Feeding Feeding Type: Breast Milk  LATCH Score                   Interventions    Lactation Tools Discussed/Used     Consult Status Consult Status: Complete    Michelle Pacheco R Bridney Guadarrama 10/18/2019, 2:50 PM

## 2019-10-18 NOTE — Lactation Note (Signed)
This note was copied from a baby's chart. Lactation Consultation Note  Patient Name: Girl Hayes Rehfeldt [id number] Date: 10/18/2019 Reason for consult: Initial assessment;Term P4, 21 hour term female infant. Per mom, infant is latching well no BF concerns. Mom is a Runner, broadcasting/film/video and chose the [employer] Flex with her [employer].    Maternal Data    Feeding Feeding Type: Breast Fed  LATCH Score Latch: Repeated attempts needed to sustain latch, nipple held in mouth throughout feeding, stimulation needed to elicit sucking reflex.  Audible Swallowing: A few with stimulation  Type of Nipple: Everted at rest and after stimulation  Comfort (Breast/Nipple): Soft / non-tender  Hold (Positioning): No assistance needed to correctly position infant at breast.  LATCH Score: 8  Interventions    Lactation Tools Discussed/Used     Consult Status Consult Status: Complete    Danelle Earthly 10/18/2019, 5:39 PM

## 2019-10-19 MED ORDER — SENNOSIDES-DOCUSATE SODIUM 8.6-50 MG PO TABS: 1.0000 | ORAL_TABLET | Freq: Two times a day (BID) | ORAL | 0 refills | Status: DC

## 2019-10-19 MED ORDER — ACETAMINOPHEN 325 MG PO TABS: 650.0000 mg | ORAL_TABLET | Freq: Four times a day (QID) | ORAL | 1 refills | Status: DC | PRN

## 2019-10-19 MED ORDER — IBUPROFEN 600 MG PO TABS
600.0000 mg | ORAL_TABLET | Freq: Four times a day (QID) | ORAL | 0 refills | Status: DC
Start: 2019-10-19 — End: 2020-06-30

## 2019-10-19 NOTE — Discharge Summary (Signed)
Postpartum Discharge Summary     Patient Name: Michelle Pacheco DOB: [birth year] MRN: [id number]  Date of admission: 10/17/2019 Delivery date:10/17/2019  Delivering provider: Burman Foster B  Date of discharge: 10/19/2019  Admitting diagnosis: Normal labor [O80, Z37.9] Intrauterine pregnancy: [redacted]w[redacted]d    Secondary diagnosis:  Active Problems:   Normal labor  Additional problems: none    Discharge diagnosis: Term Pregnancy Delivered                                              Post partum procedures:none Augmentation: N/A Complications: None  Hospital course: Onset of Labor With Vaginal Delivery      34y.o. yo [id number] 330w6das admitted in Latent Labor on 10/17/2019. Patient had an uncomplicated labor course as follows:  Membrane Rupture Time/Date: 8:20 PM ,10/17/2019   Delivery Method:Vaginal, Spontaneous  Episiotomy: None  Lacerations:  None  Patient had an uncomplicated postpartum course.  She is ambulating, tolerating a regular diet, passing flatus, and urinating well. Patient is discharged home in stable condition on 10/19/19.  Newborn Data: Birth date:10/17/2019  Birth time:8:38 PM  Gender:Female  Living status:Living  Apgars:8 ,9  Weight:2990 g   Magnesium Sulfate received: No BMZ received: No Rhophylac:No MMR:No T-DaP:Given prenatally Flu: N/A Transfusion:No  Physical exam  Vitals:   10/18/19 1040 10/18/19 1425 10/18/19 2100 10/19/19 0500  BP: 120/73 124/63 124/71 112/74  Pulse: 83 86 78 81  Resp: 20 20 18 18   Temp: 98 F (36.7 C) 97.6 F (36.4 C) 98 F (36.7 C) 97.9 F (36.6 C)  TempSrc: Oral Oral Oral Oral  SpO2: 98% 100% 100% 100%  Weight:      Height:       General: alert, cooperative and no distress Lochia: appropriate Uterine Fundus: firm Incision: N/A DVT Evaluation: No evidence of DVT seen on physical exam. Labs: Lab Results  Component Value Date   WBC 10.2 10/18/2019   HGB 11.1 (L) 10/18/2019   HCT 35.1 (L) 10/18/2019   MCV 84.8  10/18/2019   PLT 230 10/18/2019   CMP Latest Ref Rng & Units 06/19/2018  Glucose 65 - 99 mg/dL 86  BUN 6 - 20 mg/dL 13  Creatinine 0.57 - 1.00 mg/dL 0.90  Sodium 134 - 144 mmol/L 140  Potassium 3.5 - 5.2 mmol/L 4.3  Chloride 96 - 106 mmol/L 103  CO2 20 - 29 mmol/L 20  Calcium 8.7 - 10.2 mg/dL 9.6  Total Protein 6.0 - 8.5 g/dL 7.4  Total Bilirubin 0.0 - 1.2 mg/dL 0.2  Alkaline Phos 39 - 117 IU/L 80  AST 0 - 40 IU/L 19  ALT 0 - 32 IU/L 17   Edinburgh Score: Edinburgh Postnatal Depression Scale Screening Tool 10/17/2019  I have been able to laugh and see the funny side of things. (No Data)  I have looked forward with enjoyment to things. -  I have blamed myself unnecessarily when things went wrong. -  I have been anxious or worried for no good reason. -  I have felt scared or panicky for no good reason. -  Things have been getting on top of me. -  I have been so unhappy that I have had difficulty sleeping. -  I have felt sad or miserable. -  I have been so unhappy that I have been crying. -  The thought of  harming myself has occurred to me. Flavia Shipper Postnatal Depression Scale Total -      After visit meds:  Allergies as of 10/19/2019      Reactions   Bioflavonoids Hives   Peanuts [peanut Oil] Swelling   Azithromycin Diarrhea   Citrus Hives   Latex Hives   Shellfish Allergy       Medication List    STOP taking these medications   CitraNatal 90 DHA 90-1 & 300 MG Misc   ferrous sulfate 325 (65 FE) MG tablet     TAKE these medications   acetaminophen 325 MG tablet Commonly known as: Tylenol Take 2 tablets (650 mg total) by mouth every 6 (six) hours as needed for mild pain or moderate pain (for pain scale < 4).   ibuprofen 600 MG tablet Commonly known as: ADVIL Take 1 tablet (600 mg total) by mouth every 6 (six) hours.   PRENATAL VITAMIN PO Take by mouth.   senna-docusate 8.6-50 MG tablet Commonly known as: Senokot-S Take 1 tablet by mouth 2 (two) times  daily.   ZYRTEC PO Take by mouth.        Discharge home in stable condition Infant Feeding: Breast Infant Disposition:home with mother Discharge instruction: per After Visit Summary and Postpartum booklet. Activity: Advance as tolerated. Pelvic rest for 6 weeks.  Diet: routine diet Anticipated Birth Control: not discussed Postpartum Appointment:6 weeks Additional Postpartum F/U: n/a Future Appointments:No future appointments. Follow up Visit:  Follow-up Information    Christophe Louis, MD Follow up in 6 week(s).   Specialty: Obstetrics and Gynecology Contact information: [address] [place] [address] [phone]                   10/19/2019 Annalee Genta, DO

## 2019-10-19 NOTE — Discharge Instructions (Signed)

## 2019-10-25 DIAGNOSIS — R030 Elevated blood-pressure reading, without diagnosis of hypertension: Secondary | ICD-10-CM | POA: Diagnosis not present

## 2019-11-12 MED FILL — CITRANATAL 90 DHA COMBO PAC: 90-1 & 300 | 30 days supply | Qty: 60 | Fill #0

## 2019-11-18 ENCOUNTER — Other Ambulatory Visit (HOSPITAL_BASED_OUTPATIENT_CLINIC_OR_DEPARTMENT_OTHER): Payer: Self-pay | Admitting: Internal Medicine

## 2019-11-18 ENCOUNTER — Other Ambulatory Visit: Payer: Self-pay

## 2019-11-18 ENCOUNTER — Ambulatory Visit: Payer: Medicaid Other | Attending: Internal Medicine

## 2019-11-18 DIAGNOSIS — Z23 Encounter for immunization: Secondary | ICD-10-CM

## 2019-11-25 MED FILL — PFIZER-BIONTECH COVID-19 VA: 30 | 1 days supply | Qty: 0 | Fill #0

## 2019-11-28 DIAGNOSIS — Z8742 Personal history of other diseases of the female genital tract: Secondary | ICD-10-CM | POA: Diagnosis not present

## 2019-11-28 DIAGNOSIS — K439 Ventral hernia without obstruction or gangrene: Secondary | ICD-10-CM | POA: Diagnosis not present

## 2019-11-28 DIAGNOSIS — F53 Postpartum depression: Secondary | ICD-10-CM | POA: Diagnosis not present

## 2019-12-11 ENCOUNTER — Other Ambulatory Visit (HOSPITAL_BASED_OUTPATIENT_CLINIC_OR_DEPARTMENT_OTHER): Payer: Self-pay | Admitting: Internal Medicine

## 2019-12-11 MED FILL — FLUARIX QUADRIVALENT 0.5 ML: 0.5 | 1 days supply | Qty: 1 | Fill #0

## 2019-12-18 MED FILL — CITRANATAL 90 DHA COMBO PAC: 90-1 & 300 | 30 days supply | Qty: 60 | Fill #1

## 2019-12-20 ENCOUNTER — Other Ambulatory Visit (HOSPITAL_BASED_OUTPATIENT_CLINIC_OR_DEPARTMENT_OTHER): Payer: Self-pay | Admitting: Internal Medicine

## 2019-12-20 ENCOUNTER — Ambulatory Visit: Payer: Medicaid Other | Attending: Internal Medicine

## 2019-12-20 DIAGNOSIS — Z23 Encounter for immunization: Secondary | ICD-10-CM

## 2019-12-20 NOTE — Progress Notes (Signed)
   Covid-19 Vaccination Clinic  Name:  Michelle Pacheco    MRN: [id number] DOB: 12-Aug-1985  12/20/2019  [name] was observed post Covid-19 immunization for 15 minutes without incident. She was provided with Vaccine Information Sheet and instruction to access the V-Safe system.   [name] was instructed to call 911 with any severe reactions post vaccine: Marland Kitchen Difficulty breathing  . Swelling of face and throat  . A fast heartbeat  . A bad rash all over body  . Dizziness and weakness   Immunizations Administered    Name Date Dose VIS Date Route   Pfizer COVID-19 Vaccine 12/20/2019  1:57 PM 0.3 mL 12/04/2019 Intramuscular   Manufacturer: [employer], [employer]   Lot: [id number]   NDC: [address]-0

## 2019-12-26 MED FILL — PFIZER-BIONTECH COVID-19 VA: 30 | 1 days supply | Qty: 0 | Fill #0

## 2019-12-27 ENCOUNTER — Ambulatory Visit: Payer: Medicaid Other

## 2019-12-27 NOTE — Progress Notes (Unsigned)
   Covid-19 Vaccination Clinic  Name:  Michelle Pacheco    MRN: [id number] DOB: Apr 06, 1985  12/27/2019  Michelle Pacheco was observed post Covid-19 immunization for 15 minutes without incident. She was provided with Vaccine Information Sheet and instruction to access the V-Safe system.   Michelle Pacheco was instructed to call 911 with any severe reactions post vaccine: Marland Kitchen Difficulty breathing  . Swelling of face and throat  . A fast heartbeat  . A bad rash all over body  . Dizziness and weakness

## 2020-01-22 ENCOUNTER — Telehealth: Payer: Self-pay | Admitting: Family Medicine

## 2020-01-22 NOTE — Telephone Encounter (Signed)
Records released per patient's verbal request

## 2020-01-22 NOTE — Telephone Encounter (Signed)
Patient is calling to have her medical records released   To Dr.Stallimgs faxed to [phone]    Any questions please call patient at [phone] . Requested that she drop by and fill out a release form patients states that she may have on e on file .

## 2020-01-23 DIAGNOSIS — K429 Umbilical hernia without obstruction or gangrene: Secondary | ICD-10-CM | POA: Diagnosis not present

## 2020-01-23 DIAGNOSIS — D509 Iron deficiency anemia, unspecified: Secondary | ICD-10-CM | POA: Diagnosis not present

## 2020-01-23 DIAGNOSIS — K625 Hemorrhage of anus and rectum: Secondary | ICD-10-CM | POA: Diagnosis not present

## 2020-01-30 MED FILL — CITRANATAL 90 DHA COMBO PAC: 90-1 & 300 | 30 days supply | Qty: 60 | Fill #2

## 2020-02-15 HISTORY — PX: OTHER SURGICAL HISTORY: SHX169

## 2020-02-18 DIAGNOSIS — K429 Umbilical hernia without obstruction or gangrene: Secondary | ICD-10-CM | POA: Diagnosis not present

## 2020-03-04 DIAGNOSIS — M6208 Separation of muscle (nontraumatic), other site: Secondary | ICD-10-CM | POA: Diagnosis not present

## 2020-03-04 DIAGNOSIS — K5900 Constipation, unspecified: Secondary | ICD-10-CM | POA: Diagnosis not present

## 2020-03-04 DIAGNOSIS — K625 Hemorrhage of anus and rectum: Secondary | ICD-10-CM | POA: Diagnosis not present

## 2020-03-04 DIAGNOSIS — D649 Anemia, unspecified: Secondary | ICD-10-CM | POA: Diagnosis not present

## 2020-03-04 DIAGNOSIS — K429 Umbilical hernia without obstruction or gangrene: Secondary | ICD-10-CM | POA: Diagnosis not present

## 2020-03-04 DIAGNOSIS — K649 Unspecified hemorrhoids: Secondary | ICD-10-CM | POA: Diagnosis not present

## 2020-03-26 MED FILL — CITRANATAL 90 DHA COMBO PAC: 90-1 & 300 | 30 days supply | Qty: 60 | Fill #3

## 2020-03-27 ENCOUNTER — Other Ambulatory Visit (HOSPITAL_BASED_OUTPATIENT_CLINIC_OR_DEPARTMENT_OTHER): Payer: Self-pay | Admitting: Obstetrics and Gynecology

## 2020-03-27 DIAGNOSIS — K5900 Constipation, unspecified: Secondary | ICD-10-CM | POA: Diagnosis not present

## 2020-03-27 DIAGNOSIS — Z01419 Encounter for gynecological examination (general) (routine) without abnormal findings: Secondary | ICD-10-CM | POA: Diagnosis not present

## 2020-03-27 DIAGNOSIS — Z6841 Body Mass Index (BMI) 40.0 and over, adult: Secondary | ICD-10-CM | POA: Diagnosis not present

## 2020-03-27 DIAGNOSIS — K625 Hemorrhage of anus and rectum: Secondary | ICD-10-CM | POA: Diagnosis not present

## 2020-03-27 DIAGNOSIS — Z309 Encounter for contraceptive management, unspecified: Secondary | ICD-10-CM | POA: Diagnosis not present

## 2020-03-27 DIAGNOSIS — K649 Unspecified hemorrhoids: Secondary | ICD-10-CM | POA: Diagnosis not present

## 2020-03-27 MED FILL — NORETHINDRONE 0.35 MG TAB: 0.35 | 84 days supply | Qty: 84 | Fill #0

## 2020-04-02 DIAGNOSIS — K625 Hemorrhage of anus and rectum: Secondary | ICD-10-CM | POA: Diagnosis not present

## 2020-04-05 ENCOUNTER — Other Ambulatory Visit (HOSPITAL_COMMUNITY): Payer: Self-pay | Admitting: Obstetrics and Gynecology

## 2020-04-12 DIAGNOSIS — R7611 Nonspecific reaction to tuberculin skin test without active tuberculosis: Secondary | ICD-10-CM | POA: Diagnosis not present

## 2020-04-19 DIAGNOSIS — Z3202 Encounter for pregnancy test, result negative: Secondary | ICD-10-CM | POA: Diagnosis not present

## 2020-04-19 DIAGNOSIS — L309 Dermatitis, unspecified: Secondary | ICD-10-CM | POA: Diagnosis not present

## 2020-04-19 DIAGNOSIS — L299 Pruritus, unspecified: Secondary | ICD-10-CM | POA: Diagnosis not present

## 2020-04-24 ENCOUNTER — Encounter: Payer: 59 | Attending: Obstetrics and Gynecology | Admitting: Registered"

## 2020-04-24 ENCOUNTER — Encounter: Payer: Self-pay | Admitting: Registered"

## 2020-04-24 ENCOUNTER — Other Ambulatory Visit: Payer: Self-pay

## 2020-04-24 DIAGNOSIS — E669 Obesity, unspecified: Secondary | ICD-10-CM | POA: Insufficient documentation

## 2020-04-24 NOTE — Progress Notes (Addendum)
Virtual Visit via Video Note  I connected with Michelle Pacheco on 04/24/20 at  8:00 AM EST by a video enabled telemedicine application and verified that I am speaking with the correct person using two identifiers.  Location: Patient: home Provider: Therapist, music for [employer], [place], [place]   I discussed the limitations of evaluation and management by telemedicine and the availability of in person appointments. The patient expressed understanding and agreed to proceed.  Medical Nutrition Therapy  Appointment Start time:  0800  Appointment End time:  0910  Primary concerns today: current weight is highest she has been  Referral diagnosis: E66.01 Obesity Preferred learning style: no preference indicated Learning readiness: ready  NUTRITION ASSESSMENT   Anthropometrics  Pt reports she weighed 255 lbs when baby born 6 months ago, now 239 lb   Clinical Medical Hx: PCOS, Family Hx T2DM Medications: prenatal, OTC allergy meds Labs: A1c 5.4% 12/28/2018 Notable Signs/Symptoms: weight gain, fatigue  Lifestyle & Dietary Hx  Baby turned 6 months, still breastfeeding. Struggles with BF doing with PCOS. Heaviest she has been but feels she stays active.  Pt reports she eats a variety of foods, small amounts of beef, no pork and includes a lot of [place]. Pt reports 2 out of 4 children don't like meat and influences increased vegetable intake as a family.   Pt reports she has TV in bedroom and it is on all the time. Pt reports she make wake up during the night and watch a little TV, wakes up around 4:30 am and will watch until she gets up at 5:30 am. Pt reports she eats around 8:30 am mostly because getting something for kids, but doesn't feel that hungry. Dinner is usually around 7:30-8 pm due to husband's work schedule (6x per week). Pt states they used to eat dinner at table before last child. Now that she is breastfeeding she eats in the living room and husband joins her. While eating  they watching TV and catch up.  Pt reports she drinks ~6 oz water when first wakes up, then coffee with enough flavored creamer to change the color.  Patient states sometimes she works through lunch (works at home). Pt states she bought some slimfast snack bars, but doesn't eat them. Pt reports she loves french fries.  Pt report she has been having gut issues, started mallox 2 days ago. Pt states she has been very constipated and pior to constipation had rectal bleeding x1/month. Brother was diagnosed with Crohns disease last year and she is going to have a colonoscopy soon.  Estimated daily fluid intake:  50-70 oz Supplements: prenatal Sleep: 10:30p-4:30a;  Stress / self-care: 8/10 (kids at home, working from home, started classes, husband works 6 days/12 hrs) Current average weekly physical activity: ADLs, active chasing after kids, stairs in house.  24-Hr Dietary Recall First Meal: mostly oatmeal last 6 months and getting tired of it, just started adding protein and flax & chia seeds OR weekends 2 boiled eggs OR Belvita Snack: none OR fruit Second Meal: (11:45-1) skips -works through lunch OR grilled chicken sandwich, 2 fries, ([employer]) OR left overs Snack: none OR chips OR lunch Third Meal: protein, 2 vegetables, starch (rice) OR Fridays family always eats out (e.g. veggie pizza & chicken wings) Snack:  no because dinner is late.  Beverages: 40-64 oz water, 1 c coffee, sometimes soda.  Estimated Energy Needs Calories: 2200   NUTRITION DIAGNOSIS  NB-1.1 Food and nutrition-related knowledge deficit As related to role of sleep and  stress on weight and food intake.  As evidenced by pt states learning of new information provided in visit.   NUTRITION INTERVENTION  Nutrition education (E-1) on the following topics:  . Role of Sleep and stress in weight gain . Role of PCOS in body size . Mindful eating . Self-care  Handouts Provided Include   Sleep Hygiene (emailed)  Learning  Style & Readiness for Change Teaching method utilized: Visual & Auditory  Demonstrated degree of understanding via: Teach Back  Barriers to learning/adherence to lifestyle change: none  Goals Established by Pt  Consider taking the TV out of the bedroom. If you need noise consider music or nature sounds without light. Use Sleep Hygiene handout for more tips on getting   When stress triggers non-hunger eating consider source of stress and think of ways to take care of yourself that addresses the cause of stress rather than masking the feeling with food.  Conscious acknowledge the times when you are enjoying "me" time such as when out shopping and really enjoy and be present in those moments.   MONITORING & EVALUATION Dietary intake, weekly physical activity, and sleep in 4-6 weeks.  Next Steps  Patient is to work on goals and plan for more tips on mindfulness next visit.  I discussed the assessment and treatment plan with the patient. The patient was provided an opportunity to ask questions and all were answered. The patient agreed with the plan and demonstrated an understanding of the instructions.   The patient was advised to call back or seek an in-person evaluation if the symptoms worsen or if the condition fails to improve as anticipated.  I provided 60 minutes of non-face-to-face time during this encounter.  Heywood Bene, RD, LDN, CDCES

## 2020-04-25 DIAGNOSIS — E669 Obesity, unspecified: Secondary | ICD-10-CM | POA: Insufficient documentation

## 2020-04-25 NOTE — Patient Instructions (Signed)
   Consider taking the TV out of the bedroom. If you need noise consider music or nature sounds without light. Use Sleep Hygiene handout for more tips on getting   When stress triggers non-hunger eating consider source of stress and think of ways to take care of yourself that addresses the cause of stress rather than masking the feeling with food.  Conscious acknowledge the times when you are enjoying "me" time such as when out shopping and really enjoy and be present in those moments.

## 2020-04-30 ENCOUNTER — Other Ambulatory Visit (HOSPITAL_BASED_OUTPATIENT_CLINIC_OR_DEPARTMENT_OTHER): Payer: Self-pay | Admitting: Gastroenterology

## 2020-05-04 DIAGNOSIS — Z01812 Encounter for preprocedural laboratory examination: Secondary | ICD-10-CM | POA: Diagnosis not present

## 2020-05-07 DIAGNOSIS — R195 Other fecal abnormalities: Secondary | ICD-10-CM | POA: Diagnosis not present

## 2020-05-07 DIAGNOSIS — Z8379 Family history of other diseases of the digestive system: Secondary | ICD-10-CM | POA: Diagnosis not present

## 2020-05-07 DIAGNOSIS — K633 Ulcer of intestine: Secondary | ICD-10-CM | POA: Diagnosis not present

## 2020-05-07 DIAGNOSIS — K921 Melena: Secondary | ICD-10-CM | POA: Diagnosis not present

## 2020-05-07 DIAGNOSIS — K5289 Other specified noninfective gastroenteritis and colitis: Secondary | ICD-10-CM | POA: Diagnosis not present

## 2020-06-03 ENCOUNTER — Other Ambulatory Visit (HOSPITAL_BASED_OUTPATIENT_CLINIC_OR_DEPARTMENT_OTHER): Payer: Self-pay

## 2020-06-03 DIAGNOSIS — D509 Iron deficiency anemia, unspecified: Secondary | ICD-10-CM | POA: Diagnosis not present

## 2020-06-03 DIAGNOSIS — K50111 Crohn's disease of large intestine with rectal bleeding: Secondary | ICD-10-CM | POA: Diagnosis not present

## 2020-06-03 MED ORDER — MESALAMINE ER 0.375 G PO CP24
ORAL_CAPSULE | ORAL | 4 refills | Status: DC
Start: 2020-06-03 — End: 2021-04-28
  Filled 2020-06-03: qty 120, 30d supply, fill #0
  Filled 2020-12-04: qty 120, 30d supply, fill #1

## 2020-06-05 ENCOUNTER — Encounter: Payer: 59 | Attending: Obstetrics and Gynecology | Admitting: Registered"

## 2020-06-05 DIAGNOSIS — Z713 Dietary counseling and surveillance: Secondary | ICD-10-CM | POA: Diagnosis not present

## 2020-06-05 NOTE — Progress Notes (Signed)
Virtual Visit via Video Note  I connected with Michelle Pacheco on 06/05/20 at 11:00 AM EDT by a video enabled telemedicine application and verified that I am speaking with the correct person using two identifiers.  Location: Patient: home Provider: [facility]   I discussed the limitations of evaluation and management by telemedicine and the availability of in person appointments. The patient expressed understanding and agreed to proceed.  Medical Nutrition Therapy  Appointment Start time:  36  Appointment End time:  1135  Primary concerns today: continuing to build healthy behaviors with concerns of recent diagnosis of Crohns disease  Referral diagnosis: E66.01 Obesity Preferred learning style: no preference indicated Learning readiness: ready  NUTRITION ASSESSMENT   Anthropometrics  Not assessed (virtual visit)   Clinical Medical Hx: PCOS, Family Hx T2DM Medications: prenatal, OTC allergy meds Labs: A1c 5.4% 12/28/2018 Notable Signs/Symptoms: weight gain, fatigue  Lifestyle & Dietary Hx  Pt states she tried once to have the TV off while sleeping but did not sleep well because was hearing other sounds in the house.   Pt states she is still trying to breast feed but having to supplement with formula now. Pt states she will be taking medication for her new diagnosis of Crohn's soon but was told she could still breast feed.   Patient states she still needs ideas for quick and easy snacks & meals. Pt states she tends to not eat much until later in the day and when hungry doesn't have time to think about what to prepare.  Estimated daily fluid intake:  50-70 oz Supplements: prenatal Sleep: 10:30p-4:30a;  Stress / self-care: **from last visit -8/10 (kids at home, working from home, started classes, husband works 6 days/12 hrs) Current average weekly physical activity: ADLs  24-Hr Dietary Recall (recall kept from previous visit for  reference - not assessed this visit) First Meal: mostly oatmeal last 6 months and getting tired of it, just started adding protein and flax & chia seeds OR weekends 2 boiled eggs OR Belvita Snack: none OR fruit Second Meal: (11:45-1) skips -works through lunch OR grilled chicken sandwich, 2 fries, ([employer]) OR left overs Snack: none OR chips OR lunch Third Meal: protein, 2 vegetables, starch (rice) OR Fridays family always eats out (e.g. veggie pizza & chicken wings) Snack:  no because dinner is late.  Beverages: 40-64 oz water, 1 c coffee, sometimes soda.  Estimated Energy Needs Calories: 2200   NUTRITION DIAGNOSIS  NB-1.1 Food and nutrition-related knowledge deficit As related to role of sleep and stress on weight and food intake.  As evidenced by pt states learning of new information provided in visit.   NUTRITION INTERVENTION  Nutrition education (E-1) on the following topics:  . No specific diet plan for Crohn's but there are general tips to try . Still include fruits and vegetables in diet, may need to cook more if reducing fiber . Meal services including local ([employer])  Handouts Provided Include   Referenced Anatomy of a grain bowl  Learning Style & Readiness for Change Teaching method utilized: Visual & Auditory  Demonstrated degree of understanding via: Teach Back  Barriers to learning/adherence to lifestyle change: none  Goals Established by Pt  Eat every 3 hours  Use days when not working to start meal/snack planning/prepping. Lower fiber foods recommended during IBS (Crohn's) flares.   Try white noise instead of TV for sleeping  MONITORING & EVALUATION Dietary intake, weekly physical activity, and sleep in 4-6  weeks.  Next Steps  Patient is to work on eating ~every 3 hours.  I discussed the assessment and treatment plan with the patient. The patient was provided an opportunity to ask questions and all were answered. The patient agreed with the plan and  demonstrated an understanding of the instructions.   The patient was advised to call back or seek an in-person evaluation if the symptoms worsen or if the condition fails to improve as anticipated.  I provided 30 minutes of non-face-to-face time during this encounter.  Heywood Bene, RD, LDN, CDCES

## 2020-06-08 NOTE — Patient Instructions (Signed)
   Eat every 3 hours  Use days when not working to start meal/snack planning/prepping.  Lower fiber foods recommended during IBS (Crohn's) flares. Cook vegetables well.  Try white noise instead of TV for sleeping

## 2020-06-10 ENCOUNTER — Other Ambulatory Visit (HOSPITAL_BASED_OUTPATIENT_CLINIC_OR_DEPARTMENT_OTHER): Payer: Self-pay

## 2020-06-11 ENCOUNTER — Telehealth: Payer: Self-pay | Admitting: *Deleted

## 2020-06-11 NOTE — Telephone Encounter (Signed)
Per Referral Dr. Junie Panning 06/11/20 - called patient and lvm of upcoming appointments - mailed calendar with welcome packet

## 2020-06-12 ENCOUNTER — Telehealth: Payer: Self-pay | Admitting: *Deleted

## 2020-06-12 NOTE — Telephone Encounter (Signed)
Called and lvm - returning phone call about rescheduling appointment.

## 2020-06-29 ENCOUNTER — Other Ambulatory Visit: Payer: Self-pay | Admitting: Family

## 2020-06-29 ENCOUNTER — Other Ambulatory Visit (HOSPITAL_BASED_OUTPATIENT_CLINIC_OR_DEPARTMENT_OTHER): Payer: Self-pay

## 2020-06-29 DIAGNOSIS — D500 Iron deficiency anemia secondary to blood loss (chronic): Secondary | ICD-10-CM

## 2020-06-29 MED FILL — Prenat w/o A w/FeCbn-FeGl-DSS-FA Tab 90 &DHA Cap 300MG Pak: ORAL | 30 days supply | Qty: 60 | Fill #0 | Status: AC

## 2020-06-30 ENCOUNTER — Inpatient Hospital Stay (HOSPITAL_BASED_OUTPATIENT_CLINIC_OR_DEPARTMENT_OTHER): Payer: 59 | Admitting: Family

## 2020-06-30 ENCOUNTER — Encounter: Payer: Self-pay | Admitting: Family

## 2020-06-30 ENCOUNTER — Other Ambulatory Visit: Payer: Self-pay

## 2020-06-30 ENCOUNTER — Other Ambulatory Visit (HOSPITAL_BASED_OUTPATIENT_CLINIC_OR_DEPARTMENT_OTHER): Payer: Self-pay

## 2020-06-30 ENCOUNTER — Inpatient Hospital Stay: Payer: 59 | Attending: Family

## 2020-06-30 DIAGNOSIS — K50911 Crohn's disease, unspecified, with rectal bleeding: Secondary | ICD-10-CM

## 2020-06-30 DIAGNOSIS — Z87891 Personal history of nicotine dependence: Secondary | ICD-10-CM | POA: Diagnosis not present

## 2020-06-30 DIAGNOSIS — K909 Intestinal malabsorption, unspecified: Secondary | ICD-10-CM | POA: Insufficient documentation

## 2020-06-30 DIAGNOSIS — N920 Excessive and frequent menstruation with regular cycle: Secondary | ICD-10-CM | POA: Diagnosis not present

## 2020-06-30 DIAGNOSIS — K922 Gastrointestinal hemorrhage, unspecified: Secondary | ICD-10-CM | POA: Insufficient documentation

## 2020-06-30 DIAGNOSIS — D500 Iron deficiency anemia secondary to blood loss (chronic): Secondary | ICD-10-CM

## 2020-06-30 DIAGNOSIS — D509 Iron deficiency anemia, unspecified: Secondary | ICD-10-CM | POA: Insufficient documentation

## 2020-06-30 LAB — CMP (CANCER CENTER ONLY)
ALT: 21 U/L (ref 0–44)
AST: 20 U/L (ref 15–41)
Albumin: 4.3 g/dL (ref 3.5–5.0)
Alkaline Phosphatase: 64 U/L (ref 38–126)
Anion gap: 7 (ref 5–15)
BUN: 14 mg/dL (ref 6–20)
CO2: 28 mmol/L (ref 22–32)
Calcium: 9.8 mg/dL (ref 8.9–10.3)
Chloride: 103 mmol/L (ref 98–111)
Creatinine: 0.88 mg/dL (ref 0.44–1.00)
GFR, Estimated: >60 mL/min (ref >60)
Glucose, Bld: 92 mg/dL (ref 70–99)
Potassium: 3.9 mmol/L (ref 3.5–5.1)
Sodium: 138 mmol/L (ref 135–145)
Total Bilirubin: 0.3 mg/dL (ref 0.3–1.2)
Total Protein: 6.8 g/dL (ref 6.5–8.1)

## 2020-06-30 LAB — CBC WITH DIFFERENTIAL (CANCER CENTER ONLY)
Abs Immature Granulocytes: 0.02 10*3/uL (ref 0.00–0.07)
Basophils Absolute: 0 10*3/uL (ref 0.0–0.1)
Basophils Relative: 0 %
Eosinophils Absolute: 0.1 10*3/uL (ref 0.0–0.5)
Eosinophils Relative: 3 %
HCT: 36.4 % (ref 36.0–46.0)
Hemoglobin: 11.8 g/dL — ABNORMAL LOW (ref 12.0–15.0)
Immature Granulocytes: 0 %
Lymphocytes Relative: 32 %
Lymphs Abs: 1.6 10*3/uL (ref 0.7–4.0)
MCH: 26.8 pg (ref 26.0–34.0)
MCHC: 32.4 g/dL (ref 30.0–36.0)
MCV: 82.5 fL (ref 80.0–100.0)
Monocytes Absolute: 0.3 10*3/uL (ref 0.1–1.0)
Monocytes Relative: 7 %
Neutro Abs: 3 10*3/uL (ref 1.7–7.7)
Neutrophils Relative %: 58 %
Platelet Count: 329 10*3/uL (ref 150–400)
RBC: 4.41 MIL/uL (ref 3.87–5.11)
RDW: 14.5 % (ref 11.5–15.5)
WBC Count: 5.1 10*3/uL (ref 4.0–10.5)
nRBC: 0 % (ref 0.0–0.2)

## 2020-06-30 LAB — IRON AND TIBC
Iron: 50 ug/dL (ref 41–142)
Saturation Ratios: 14 % — ABNORMAL LOW (ref 21–57)
TIBC: 346 ug/dL (ref 236–444)
UIBC: 297 ug/dL (ref 120–384)

## 2020-06-30 LAB — RETICULOCYTES
Immature Retic Fract: 12.1 % (ref 2.3–15.9)
RBC.: 4.4 MIL/uL (ref 3.87–5.11)
Retic Count, Absolute: 83.6 10*3/uL (ref 19.0–186.0)
Retic Ct Pct: 1.9 % (ref 0.4–3.1)

## 2020-06-30 LAB — FERRITIN: Ferritin: 33 ng/mL (ref 11–307)

## 2020-06-30 LAB — SAMPLE TO BLOOD BANK

## 2020-06-30 LAB — LACTATE DEHYDROGENASE: LDH: 181 U/L (ref 98–192)

## 2020-06-30 NOTE — Progress Notes (Signed)
Hematology/Oncology Consultation   Name: Michelle Pacheco      MRN: [id number]    Location: Room/bed info not found  Date: 06/30/2020 Time:12:39 PM   REFERRING PHYSICIAN: Celso Amy, PA-C  REASON FOR CONSULT: Iron deficiency anemia    DIAGNOSIS: Iron deficiency anemia secondary to heavy cycles and intermittent GI blood loss/malabsorption with Crohn's   HISTORY OF PRESENT ILLNESS: Ms. Michelle Pacheco is a very pleasant 35 yo African American female with history of iron deficiency anemia since childhood.  She was recently diagnosed with Crohn's disease in March with colonoscopy with Dr. Ewing Schlein. She states that her brother also has Crohn's and ir on Remicade infusions.  She has discussed starting treatment with Methotrexate but is still breastfeeding her baby and wants to hold off. She also states that her symptoms are minimal and wants to see if IV iron helps.  She also has a very heavy cycle that is irregular. She has PCOS.  No history of sickle cell disease or trait.  Her mother has MS and also has history of anemia. She is symptomatic with fatigue and sometimes just has to sit down to rest.  She has tried taking oral iron but this has not improved her counts and has caused some GI upset and constipation.  She takes Miralax to help prevent the constipation and staining. She does note blood in her stool often.  No history of thyroid disease or diabetes.  No personal history of cancer. Her maternal grandfather had prostate cancer.  She has 4 children and history of 1 miscarriage at 8 weeks with D&C.  She required transfusions after her first and third deliveries due to hemorrhaging. She has had no other issue with bleeding after procedures.  No abnormal bruising, no petechiae.  No smoking or recreational drug use. Rare ETOH socially.  She states that her appetite comes and goes. She is seeing a nutritionist and states that they have told her to start grazing throughout the day. She also admits that she  needs to better hydrate. Her weight is 246 lbs.  She stays busy with her sweet family as well as working for [employer].   ROS: All other 10 point review of systems is negative.   PAST MEDICAL HISTORY:   Past Medical History:  Diagnosis Date  . Anemia   . Anxiety   . Blood transfusion without reported diagnosis    x2  . Medical history non-contributory     ALLERGIES: Allergies  Allergen Reactions  . Bioflavonoids Hives  . Latex Hives  . Peanuts [Peanut Oil] Swelling  . Azithromycin Diarrhea  . Citrus Hives  . Shellfish Allergy       MEDICATIONS:  Current Outpatient Medications on File Prior to Visit  Medication Sig Dispense Refill  . Cetirizine HCl (ZYRTEC PO) Take by mouth.    . mesalamine (APRISO) 0.375 g 24 hr capsule take 4 capsules by mouth in the morning Once a day 120 capsule 4  . polyethylene glycol powder (GLYCOLAX/MIRALAX) 17 GM/SCOOP powder See admin instructions.    . Prenat w/o A-FeCbGl-DSS-FA-DHA (CITRANATAL 90 DHA) 90-1 & 300 MG MISC TAKE AS DIRECTED 180 each 3   No current facility-administered medications on file prior to visit.     PAST SURGICAL HISTORY Past Surgical History:  Procedure Laterality Date  . DILATION AND CURETTAGE OF UTERUS    . WISDOM TOOTH EXTRACTION      FAMILY HISTORY: Family History  Problem Relation Age of Onset  . Multiple sclerosis Mother   .  Diabetes Father   . Asthma Brother   . Diabetes Maternal Grandfather   . Hypertension Maternal Grandfather   . Prostate cancer Maternal Grandfather     SOCIAL HISTORY:  reports that she quit smoking about 8 years ago. Her smoking use included cigarettes. She has never used smokeless tobacco. She reports current alcohol use. She reports that she does not use drugs.  PERFORMANCE STATUS: The patient's performance status is 1 - Symptomatic but completely ambulatory  PHYSICAL EXAM: Most Recent Vital Signs: Blood pressure 119/71, pulse 75, temperature 98.4 F (36.9 C), temperature  source Oral, resp. rate 18, height 5\' 4"  (1.626 m), weight 246 lb 6.4 oz (111.8 kg), SpO2 100 %, unknown if currently breastfeeding. BP 119/71 (BP Location: Left Arm, Patient Position: Sitting)   Pulse 75   Temp 98.4 F (36.9 C) (Oral)   Resp 18   Ht 5\' 4"  (1.626 m)   Wt 246 lb 6.4 oz (111.8 kg)   SpO2 100%   BMI 42.29 kg/m   General Appearance:    Alert, cooperative, no distress, appears stated age  Head:    Normocephalic, without obvious abnormality, atraumatic  Eyes:    PERRL, conjunctiva/corneas clear, EOM's intact, fundi    benign, both eyes  Ears:    Normal TM's and external ear canals, both ears  Nose:   Nares normal, septum midline, mucosa normal, no drainage    or sinus tenderness  Throat:   Lips, mucosa, and tongue normal; teeth and gums normal  Neck:   Supple, symmetrical, trachea midline, no adenopathy;    thyroid:  no enlargement/tenderness/nodules; no carotid   bruit or JVD  Back:     Symmetric, no curvature, ROM normal, no CVA tenderness  Lungs:     Clear to auscultation bilaterally, respirations unlabored  Chest Wall:    No tenderness or deformity   Heart:    Regular rate and rhythm, S1 and S2 normal, no murmur, rub   or gallop  Breast Exam:    No tenderness, masses, or nipple abnormality  Abdomen:     Soft, non-tender, bowel sounds active all four quadrants,    no masses, no organomegaly  Genitalia:    Normal female without lesion, discharge or tenderness  Rectal:    Normal tone, normal prostate, no masses or tenderness;   guaiac negative stool  Extremities:   Extremities normal, atraumatic, no cyanosis or edema  Pulses:   2+ and symmetric all extremities  Skin:   Skin color, texture, turgor normal, no rashes or lesions  Lymph nodes:   Cervical, supraclavicular, and axillary nodes normal  Neurologic:   CNII-XII intact, normal strength, sensation and reflexes    throughout    LABORATORY DATA:  Results for orders placed or performed in visit on 06/30/20 (from  the past 48 hour(s))  CBC with Differential (Cancer Center Only)     Status: Abnormal   Collection Time: 06/30/20  8:55 AM  Result Value Ref Range   WBC Count 5.1 4.0 - 10.5 K/uL   RBC 4.41 3.87 - 5.11 MIL/uL   Hemoglobin 11.8 (L) 12.0 - 15.0 g/dL   HCT 07/02/20 07/02/20 - 16.1 %   MCV 82.5 80.0 - 100.0 fL   MCH 26.8 26.0 - 34.0 pg   MCHC 32.4 30.0 - 36.0 g/dL   RDW 09.6 04.5 - 40.9 %   Platelet Count 329 150 - 400 K/uL   nRBC 0.0 0.0 - 0.2 %   Neutrophils Relative % 58 %  Neutro Abs 3.0 1.7 - 7.7 K/uL   Lymphocytes Relative 32 %   Lymphs Abs 1.6 0.7 - 4.0 K/uL   Monocytes Relative 7 %   Monocytes Absolute 0.3 0.1 - 1.0 K/uL   Eosinophils Relative 3 %   Eosinophils Absolute 0.1 0.0 - 0.5 K/uL   Basophils Relative 0 %   Basophils Absolute 0.0 0.0 - 0.1 K/uL   Immature Granulocytes 0 %   Abs Immature Granulocytes 0.02 0.00 - 0.07 K/uL    Comment: Performed at [facility] at [facility], [address] [place], [place] [address]  CMP (Cancer Center only)     Status: None   Collection Time: 06/30/20  8:55 AM  Result Value Ref Range   Sodium 138 135 - 145 mmol/L   Potassium 3.9 3.5 - 5.1 mmol/L   Chloride 103 98 - 111 mmol/L   CO2 28 22 - 32 mmol/L   Glucose, Bld 92 70 - 99 mg/dL    Comment: Glucose reference range applies only to samples taken after fasting for at least 8 hours.   BUN 14 6 - 20 mg/dL   Creatinine 3.38 2.50 - 1.00 mg/dL   Calcium 9.8 8.9 - 53.9 mg/dL   Total Protein 6.8 6.5 - 8.1 g/dL   Albumin 4.3 3.5 - 5.0 g/dL   AST 20 15 - 41 U/L   ALT 21 0 - 44 U/L   Alkaline Phosphatase 64 38 - 126 U/L   Total Bilirubin 0.3 0.3 - 1.2 mg/dL   GFR, Estimated >76 >73 mL/min    Comment: (NOTE) Calculated using the CKD-EPI Creatinine Equation (2021)    Anion gap 7 5 - 15    Comment: Performed at [facility] at [facility], [address] [place], [place] [address]  Ferritin     Status: None   Collection Time: 06/30/20   8:55 AM  Result Value Ref Range   Ferritin 33 11 - 307 ng/mL    Comment: Performed at [facility], [address], [place], [place] [address]  Iron and TIBC     Status: Abnormal   Collection Time: 06/30/20  8:55 AM  Result Value Ref Range   Iron 50 41 - 142 ug/dL   TIBC 973 532 - 992 ug/dL   Saturation Ratios 14 (L) 21 - 57 %   UIBC 297 120 - 384 ug/dL    Comment: Performed at [facility], [address], [place], [place] [address]  Lactate dehydrogenase (LDH)     Status: None   Collection Time: 06/30/20  8:55 AM  Result Value Ref Range   LDH 181 98 - 192 U/L    Comment: Performed at [facility] at [facility], [address] [place], [place] [address]  Reticulocytes     Status: None   Collection Time: 06/30/20  8:55 AM  Result Value Ref Range   Retic Ct Pct 1.9 0.4 - 3.1 %   RBC. 4.40 3.87 - 5.11 MIL/uL   Retic Count, Absolute 83.6 19.0 - 186.0 K/uL   Immature Retic Fract 12.1 2.3 - 15.9 %    Comment: Performed at [facility] at [facility], [address] [place], [place] [address]  Sample to Blood Bank     Status: None   Collection Time: 06/30/20  8:56 AM  Result Value Ref Range  Blood Bank Specimen SAMPLE AVAILABLE FOR TESTING    Sample Expiration      07/03/2020,2359 Performed at [facility], [address] [place], [place] [id number]       RADIOGRAPHY: No results found.     PATHOLOGY: None  ASSESSMENT/PLAN: Ms. Fayrene FearingJames is a very pleasant 35 yo [place] female with history of iron deficiency anemia secondary to heavy cycles and intermittent GI blood loss/malabsorption with Crohn's.  Iron saturation is 14% and ferritin 33.  We will get her set up for 2 doses of IV iron.  Follow-up in another 8 weeks.   All questions were answered. The patient knows to call the clinic with any problems, questions or concerns. We can certainly see the  patient much sooner if necessary.  The patient was discussed with Dr. Myna HidalgoEnnever and he is in agreement with the aforementioned.   Emeline GinsSarah Carmeline Kowal, NP

## 2020-07-01 ENCOUNTER — Telehealth: Payer: Self-pay | Admitting: *Deleted

## 2020-07-01 LAB — ERYTHROPOIETIN: Erythropoietin: 11.8 m[IU]/mL (ref 2.6–18.5)

## 2020-07-01 NOTE — Telephone Encounter (Signed)
Per scheduling message 06/30/20 Maralyn Sago - called and gave upcoming appointments (2) doses of IV Iron

## 2020-07-01 NOTE — Telephone Encounter (Signed)
Per 06/30/20 loss - called and gave upcoming appointments

## 2020-07-06 ENCOUNTER — Telehealth: Payer: Self-pay

## 2020-07-06 NOTE — Telephone Encounter (Signed)
Returned pts call to r/s her 5/26 appts to 5/25, done   Michelle Pacheco

## 2020-07-08 ENCOUNTER — Other Ambulatory Visit: Payer: Self-pay

## 2020-07-08 ENCOUNTER — Inpatient Hospital Stay: Payer: 59

## 2020-07-08 VITALS — BP 128/58 | HR 65 | Temp 98.3°F | Resp 17

## 2020-07-08 DIAGNOSIS — K909 Intestinal malabsorption, unspecified: Secondary | ICD-10-CM | POA: Diagnosis not present

## 2020-07-08 DIAGNOSIS — K50911 Crohn's disease, unspecified, with rectal bleeding: Secondary | ICD-10-CM | POA: Diagnosis not present

## 2020-07-08 DIAGNOSIS — K922 Gastrointestinal hemorrhage, unspecified: Secondary | ICD-10-CM | POA: Diagnosis not present

## 2020-07-08 DIAGNOSIS — D500 Iron deficiency anemia secondary to blood loss (chronic): Secondary | ICD-10-CM

## 2020-07-08 DIAGNOSIS — N920 Excessive and frequent menstruation with regular cycle: Secondary | ICD-10-CM | POA: Diagnosis not present

## 2020-07-08 DIAGNOSIS — Z87891 Personal history of nicotine dependence: Secondary | ICD-10-CM | POA: Diagnosis not present

## 2020-07-08 MED ORDER — SODIUM CHLORIDE 0.9 % IV SOLN
Freq: Once | INTRAVENOUS | Status: AC
Start: 2020-07-08 — End: 2020-07-08
  Filled 2020-07-08: qty 250

## 2020-07-08 MED ORDER — SODIUM CHLORIDE 0.9 % IV SOLN
125.0000 mg | Freq: Once | INTRAVENOUS | Status: AC
  Administered 2020-07-08: 125 mg via INTRAVENOUS
  Filled 2020-07-08: qty 10

## 2020-07-08 NOTE — Patient Instructions (Signed)
Sodium Ferric Gluconate Complex injection What is this medicine? SODIUM FERRIC GLUCONATE COMPLEX (SOE dee um FER ik GLOO koe nate KOM pleks) is an iron replacement. It is used with epoetin therapy to treat low iron levels in patients who are receiving hemodialysis. This medicine may be used for other purposes; ask your health care provider or pharmacist if you have questions. COMMON BRAND NAME(S): Ferrlecit, Nulecit What should I tell my health care provider before I take this medicine? They need to know if you have any of the following conditions:  anemia that is not from iron deficiency  high levels of iron in the body  an unusual or allergic reaction to iron, benzyl alcohol, other medicines, foods, dyes, or preservatives  pregnant or are trying to become pregnant  breast-feeding How should I use this medicine? This medicine is for infusion into a vein. It is given by a health care professional in a hospital or clinic setting. Talk to your pediatrician regarding the use of this medicine in children. While this drug may be prescribed for children as young as 6 years old for selected conditions, precautions do apply. Overdosage: If you think you have taken too much of this medicine contact a poison control center or emergency room at once. NOTE: This medicine is only for you. Do not share this medicine with others. What if I miss a dose? It is important not to miss your dose. Call your doctor or health care professional if you are unable to keep an appointment. What may interact with this medicine? Do not take this medicine with any of the following medications:  deferoxamine  dimercaprol  other iron products This medicine may also interact with the following medications:  chloramphenicol  deferasirox  medicine for blood pressure like enalapril This list may not describe all possible interactions. Give your health care provider a list of all the medicines, herbs,  non-prescription drugs, or dietary supplements you use. Also tell them if you smoke, drink alcohol, or use illegal drugs. Some items may interact with your medicine. What should I watch for while using this medicine? Your condition will be monitored carefully while you are receiving this medicine. Visit your doctor for check-ups as directed. What side effects may I notice from receiving this medicine? Side effects that you should report to your doctor or health care professional as soon as possible:  allergic reactions like skin rash, itching or hives, swelling of the face, lips, or tongue  breathing problems  changes in hearing  changes in vision  chills, flushing, or sweating  fast, irregular heartbeat  feeling faint or lightheaded, falls  fever, flu-like symptoms  high or low blood pressure  pain, tingling, numbness in the hands or feet  severe pain in the chest, back, flanks, or groin  swelling of the ankles, feet, hands  trouble passing urine or change in the amount of urine  unusually weak or tired Side effects that usually do not require medical attention (report to your doctor or health care professional if they continue or are bothersome):  cramps  dark colored stools  diarrhea  headache  nausea, vomiting  stomach upset This list may not describe all possible side effects. Call your doctor for medical advice about side effects. You may report side effects to FDA at 1-800-FDA-1088. Where should I keep my medicine? This drug is given in a hospital or clinic and will not be stored at home. NOTE: This sheet is a summary. It may not cover all   possible information. If you have questions about this medicine, talk to your doctor, pharmacist, or health care provider.  2021 Elsevier/Gold Standard (2007-10-03 15:58:57)   

## 2020-07-09 ENCOUNTER — Inpatient Hospital Stay: Payer: 59

## 2020-07-14 ENCOUNTER — Other Ambulatory Visit: Payer: Medicaid Other

## 2020-07-15 ENCOUNTER — Telehealth: Payer: Self-pay

## 2020-07-15 NOTE — Telephone Encounter (Signed)
Returned pts call to r/s her 6/2 iron tx as she tested positive for covid today    Michelle Pacheco

## 2020-07-16 ENCOUNTER — Inpatient Hospital Stay: Payer: 59

## 2020-07-24 ENCOUNTER — Encounter: Payer: 59 | Admitting: Registered"

## 2020-07-30 ENCOUNTER — Other Ambulatory Visit: Payer: Self-pay

## 2020-07-30 ENCOUNTER — Inpatient Hospital Stay: Payer: 59 | Attending: Family

## 2020-07-30 VITALS — BP 124/64 | HR 87 | Temp 98.3°F | Resp 17

## 2020-07-30 DIAGNOSIS — K922 Gastrointestinal hemorrhage, unspecified: Secondary | ICD-10-CM | POA: Diagnosis not present

## 2020-07-30 DIAGNOSIS — N920 Excessive and frequent menstruation with regular cycle: Secondary | ICD-10-CM | POA: Insufficient documentation

## 2020-07-30 DIAGNOSIS — D500 Iron deficiency anemia secondary to blood loss (chronic): Secondary | ICD-10-CM | POA: Diagnosis not present

## 2020-07-30 MED ORDER — SODIUM CHLORIDE 0.9 % IV SOLN
Freq: Once | INTRAVENOUS | Status: AC
  Filled 2020-07-30: qty 250

## 2020-07-30 MED ORDER — SODIUM CHLORIDE 0.9 % IV SOLN
125.0000 mg | Freq: Once | INTRAVENOUS | Status: AC
  Administered 2020-07-30: 125 mg via INTRAVENOUS
  Filled 2020-07-30: qty 10

## 2020-07-30 NOTE — Patient Instructions (Signed)
Sodium Ferric Gluconate Complex injection What is this medication? SODIUM FERRIC GLUCONATE COMPLEX (SOE dee um FER ik GLOO koe nate KOM pleks) is an iron replacement. It is used with epoetin therapy to treat low iron levelsin patients who are receiving hemodialysis. This medicine may be used for other purposes; ask your health care provider orpharmacist if you have questions. COMMON BRAND NAME(S): Ferrlecit, Nulecit What should I tell my care team before I take this medication? They need to know if you have any of the following conditions: anemia that is not from iron deficiency high levels of iron in the body an unusual or allergic reaction to iron, benzyl alcohol, other medicines, foods, dyes, or preservatives pregnant or are trying to become pregnant breast-feeding How should I use this medication? This medicine is for infusion into a vein. It is given by a health careprofessional in a hospital or clinic setting. Talk to your pediatrician regarding the use of this medicine in children. While this drug may be prescribed for children as young as 6 years old for selectedconditions, precautions do apply. Overdosage: If you think you have taken too much of this medicine contact apoison control center or emergency room at once. NOTE: This medicine is only for you. Do not share this medicine with others. What if I miss a dose? It is important not to miss your dose. Call your doctor or health careprofessional if you are unable to keep an appointment. What may interact with this medication? Do not take this medicine with any of the following medications: deferoxamine dimercaprol other iron products This medicine may also interact with the following medications: chloramphenicol deferasirox medicine for blood pressure like enalapril This list may not describe all possible interactions. Give your health care provider a list of all the medicines, herbs, non-prescription drugs, or dietary  supplements you use. Also tell them if you smoke, drink alcohol, or use illegaldrugs. Some items may interact with your medicine. What should I watch for while using this medication? Your condition will be monitored carefully while you are receiving thismedicine. Visit your doctor for check-ups as directed. What side effects may I notice from receiving this medication? Side effects that you should report to your doctor or health care professionalas soon as possible: allergic reactions like skin rash, itching or hives, swelling of the face, lips, or tongue breathing problems changes in hearing changes in vision chills, flushing, or sweating fast, irregular heartbeat feeling faint or lightheaded, falls fever, flu-like symptoms high or low blood pressure pain, tingling, numbness in the hands or feet severe pain in the chest, back, flanks, or groin swelling of the ankles, feet, hands trouble passing urine or change in the amount of urine unusually weak or tired Side effects that usually do not require medical attention (report to yourdoctor or health care professional if they continue or are bothersome): cramps dark colored stools diarrhea headache nausea, vomiting stomach upset This list may not describe all possible side effects. Call your doctor for medical advice about side effects. You may report side effects to FDA at1-800-FDA-1088. Where should I keep my medication? This drug is given in a hospital or clinic and will not be stored at home. NOTE: This sheet is a summary. It may not cover all possible information. If you have questions about this medicine, talk to your doctor, pharmacist, orhealth care provider.  2022 Elsevier/Gold Standard (2007-10-03 15:58:57)  

## 2020-08-03 ENCOUNTER — Ambulatory Visit: Payer: 59 | Admitting: Registered"

## 2020-08-03 DIAGNOSIS — Z713 Dietary counseling and surveillance: Secondary | ICD-10-CM

## 2020-08-07 NOTE — Progress Notes (Signed)
Virtual Visit via Video Note  I connected with Michelle Pacheco on 08/07/20 at  5:00 PM EDT by a video enabled telemedicine application and verified that I am speaking with the correct person using two identifiers.  Location: Patient: home Provider: [facility], [place]   I discussed the limitations of evaluation and management by telemedicine and the availability of in person appointments. The patient expressed understanding and agreed to proceed.  Medical Nutrition Therapy  Appointment Start time: 1700   Appointment End time:  1730   Primary concerns today: build healthy behaviors with concerns of recent diagnosis of Crohns disease (continues) Referral diagnosis: E66.01 Obesity Preferred learning style: no preference indicated Learning readiness: ready   NUTRITION ASSESSMENT    Anthropometrics  Not assessed (virtual visit)    Clinical Medical Hx: Crohns, PCOS, Family Hx T2DM Medications: prenatal, OTC allergy meds, (has not started taking Crohn's medication) Labs: A1c 5.4% 12/28/2018 Notable Signs/Symptoms: weight gain, fatigue   Lifestyle & Dietary Hx  Pt states she wants to get umbilical surgery and will know after July 20 if she will be cleared for surgery.  Pt sates she continues to breast feed and would like to continue but milk supply is decreasing. Pt states she supplements with [employer] formula, but with shortage sometimes has a hard time getting.   Pt states she is still working from home, would like to go into the office sometimes but office space is changing and she will no longer have her own space.  Pt states she still finds it hard to take breaks for snacks and lunch while working. Pt reports her supervisor encourages her to take lunch break.  Assessment and Plan:  Increase water intake: Use 33 oz water bottle and find a routine of regularly drinking water. May help with breast milk supply. Continue to work on eating every 3 hrs. Use Echo to help remind you,  especially to take a lunch break. Plan snacks that will provide more nutrition such asn protein shakes or vegetables and humus.   Follow Up Instructions: Continue to work on finding balance with work and eating well. Return for follow-up visit to continued progress toward goals.  I discussed the assessment and treatment plan with the patient. The patient was provided an opportunity to ask questions and all were answered. The patient agreed with the plan and demonstrated an understanding of the instructions.   The patient was advised to call back or seek an in-person evaluation if the symptoms worsen or if the condition fails to improve as anticipated.  I provided 30 minutes of non-face-to-face time during this encounter.   Carolan Shiver, RD, CDCES

## 2020-08-22 DIAGNOSIS — L989 Disorder of the skin and subcutaneous tissue, unspecified: Secondary | ICD-10-CM | POA: Diagnosis not present

## 2020-08-31 ENCOUNTER — Inpatient Hospital Stay: Payer: 59

## 2020-08-31 ENCOUNTER — Inpatient Hospital Stay: Payer: 59 | Admitting: Family

## 2020-09-02 ENCOUNTER — Other Ambulatory Visit (HOSPITAL_BASED_OUTPATIENT_CLINIC_OR_DEPARTMENT_OTHER): Payer: Self-pay

## 2020-09-02 ENCOUNTER — Other Ambulatory Visit: Payer: Self-pay

## 2020-09-04 ENCOUNTER — Other Ambulatory Visit (HOSPITAL_BASED_OUTPATIENT_CLINIC_OR_DEPARTMENT_OTHER): Payer: Self-pay

## 2020-09-04 MED ORDER — CITRANATAL 90 DHA 90-1 & 300 MG PO MISC
ORAL | 3 refills | Status: DC
  Filled 2020-09-04: qty 60, 30d supply, fill #0

## 2020-09-07 ENCOUNTER — Other Ambulatory Visit (HOSPITAL_BASED_OUTPATIENT_CLINIC_OR_DEPARTMENT_OTHER): Payer: Self-pay

## 2020-09-08 ENCOUNTER — Encounter: Payer: Self-pay | Admitting: Family

## 2020-09-10 ENCOUNTER — Encounter: Payer: Self-pay | Admitting: Family

## 2020-09-10 ENCOUNTER — Inpatient Hospital Stay: Payer: 59 | Attending: Family

## 2020-09-10 ENCOUNTER — Telehealth: Payer: Self-pay

## 2020-09-10 ENCOUNTER — Inpatient Hospital Stay (HOSPITAL_BASED_OUTPATIENT_CLINIC_OR_DEPARTMENT_OTHER): Payer: 59 | Admitting: Family

## 2020-09-10 ENCOUNTER — Other Ambulatory Visit: Payer: Self-pay

## 2020-09-10 VITALS — BP 122/79 | HR 75 | Temp 98.4°F | Resp 17 | Wt 248.0 lb

## 2020-09-10 DIAGNOSIS — D500 Iron deficiency anemia secondary to blood loss (chronic): Secondary | ICD-10-CM

## 2020-09-10 DIAGNOSIS — K5090 Crohn's disease, unspecified, without complications: Secondary | ICD-10-CM | POA: Diagnosis not present

## 2020-09-10 DIAGNOSIS — K909 Intestinal malabsorption, unspecified: Secondary | ICD-10-CM | POA: Diagnosis not present

## 2020-09-10 DIAGNOSIS — K922 Gastrointestinal hemorrhage, unspecified: Secondary | ICD-10-CM | POA: Diagnosis not present

## 2020-09-10 DIAGNOSIS — N920 Excessive and frequent menstruation with regular cycle: Secondary | ICD-10-CM | POA: Insufficient documentation

## 2020-09-10 LAB — CBC WITH DIFFERENTIAL (CANCER CENTER ONLY)
Abs Immature Granulocytes: 0.02 10*3/uL (ref 0.00–0.07)
Basophils Absolute: 0 10*3/uL (ref 0.0–0.1)
Basophils Relative: 0 %
Eosinophils Absolute: 0.2 10*3/uL (ref 0.0–0.5)
Eosinophils Relative: 3 %
HCT: 38.7 % (ref 36.0–46.0)
Hemoglobin: 12.4 g/dL (ref 12.0–15.0)
Immature Granulocytes: 0 %
Lymphocytes Relative: 29 %
Lymphs Abs: 1.6 10*3/uL (ref 0.7–4.0)
MCH: 27 pg (ref 26.0–34.0)
MCHC: 32 g/dL (ref 30.0–36.0)
MCV: 84.3 fL (ref 80.0–100.0)
Monocytes Absolute: 0.4 10*3/uL (ref 0.1–1.0)
Monocytes Relative: 7 %
Neutro Abs: 3.3 10*3/uL (ref 1.7–7.7)
Neutrophils Relative %: 61 %
Platelet Count: 284 10*3/uL (ref 150–400)
RBC: 4.59 MIL/uL (ref 3.87–5.11)
RDW: 14.6 % (ref 11.5–15.5)
WBC Count: 5.5 10*3/uL (ref 4.0–10.5)
nRBC: 0 % (ref 0.0–0.2)

## 2020-09-10 LAB — RETICULOCYTES
Immature Retic Fract: 10 % (ref 2.3–15.9)
RBC.: 4.66 MIL/uL (ref 3.87–5.11)
Retic Count, Absolute: 86.2 10*3/uL (ref 19.0–186.0)
Retic Ct Pct: 1.9 % (ref 0.4–3.1)

## 2020-09-10 LAB — FERRITIN: Ferritin: 52 ng/mL (ref 11–307)

## 2020-09-10 LAB — IRON AND TIBC
Iron: 86 ug/dL (ref 41–142)
Saturation Ratios: 26 % (ref 21–57)
TIBC: 329 ug/dL (ref 236–444)
UIBC: 243 ug/dL (ref 120–384)

## 2020-09-10 NOTE — Telephone Encounter (Signed)
Appts made and priinted for pt per 09/10/20 los  [employer]

## 2020-09-10 NOTE — Progress Notes (Signed)
Hematology and Oncology Follow Up Visit  Michelle Pacheco [id number] 03/04/1985 35 y.o. 09/10/2020   Principle Diagnosis:  Iron deficiency anemia secondary to heavy cycles and intermittent GI blood loss/malabsorption with Crohn's   Current Therapy:   IV iron as indicated    Interim History:  Michelle Pacheco is here today for follow-up. She is doing well and is staying busy with her sweet family.  She plans to breast feed her daughter through September.  Her cycles is still quite heavy and irregular. No other blood loss noted. No bruising or petechiae.  She plans to speak with her gynecologist about birthcontrol to help regular her cycles once she finishes breast feeding.  No fever, chills, n/v, cough, rash, dizziness, SOB, chest pain, palpitations, abdominal pain or changes in bowel or bladder habits.  No swelling, tenderness, numbness or tingling in her extremities at this time. She has occasional puffiness in her feet with dehydration.  She has maintained a good appetite and is seeing a nutritionist to help her with her caloric intake and blood sugar management.  She admits that she needs to hydrate more throughout the day. Her weight is stable at 248 lbs.   ECOG Performance Status: 1 - Symptomatic but completely ambulatory  Medications:  Allergies as of 09/10/2020       Reactions   Bioflavonoids Hives   Latex Hives   Peanuts [peanut Oil] Swelling   Azithromycin Diarrhea   Citrus Hives   Shellfish Allergy         Medication List        Accurate as of September 10, 2020  8:50 AM. If you have any questions, ask your nurse or doctor.          CitraNatal 90 DHA 90-1 & 300 MG Misc Use as directed once daily   mesalamine 0.375 g 24 hr capsule Commonly known as: APRISO take 4 capsules by mouth in the morning Once a day   polyethylene glycol powder 17 GM/SCOOP powder Commonly known as: GLYCOLAX/MIRALAX See admin instructions.   ZYRTEC PO Take by mouth.         Allergies:  Allergies  Allergen Reactions   Bioflavonoids Hives   Latex Hives   Peanuts [Peanut Oil] Swelling   Azithromycin Diarrhea   Citrus Hives   Shellfish Allergy     Past Medical History, Surgical history, Social history, and Family History were reviewed and updated.  Review of Systems: All other 10 point review of systems is negative.   Physical Exam:  vitals were not taken for this visit.   Wt Readings from Last 3 Encounters:  06/30/20 246 lb 6.4 oz (111.8 kg)  10/17/19 253 lb (114.8 kg)  02/27/19 228 lb 3.2 oz (103.5 kg)    Ocular: Sclerae unicteric, pupils equal, round and reactive to light Ear-nose-throat: Oropharynx clear, dentition fair Lymphatic: No cervical or supraclavicular adenopathy Lungs no rales or rhonchi, good excursion bilaterally Heart regular rate and rhythm, no murmur appreciated Abd soft, nontender, positive bowel sounds MSK no focal spinal tenderness, no joint edema Neuro: non-focal, well-oriented, appropriate affect Breasts: Deferred   Lab Results  Component Value Date   WBC 5.5 09/10/2020   HGB 12.4 09/10/2020   HCT 38.7 09/10/2020   MCV 84.3 09/10/2020   PLT 284 09/10/2020   Lab Results  Component Value Date   FERRITIN 33 06/30/2020   IRON 50 06/30/2020   TIBC 346 06/30/2020   UIBC 297 06/30/2020   IRONPCTSAT 14 (L) 06/30/2020   Lab  Results  Component Value Date   RETICCTPCT 1.9 09/10/2020   RBC 4.66 09/10/2020   RBC 4.59 09/10/2020   No results found for: KPAFRELGTCHN, LAMBDASER, KAPLAMBRATIO No results found for: IGGSERUM, IGA, IGMSERUM No results found for: Marda Stalker, SPEI   Chemistry      Component Value Date/Time   NA 138 06/30/2020 0855   NA 140 06/19/2018 0813   K 3.9 06/30/2020 0855   CL 103 06/30/2020 0855   CO2 28 06/30/2020 0855   BUN 14 06/30/2020 0855   BUN 13 06/19/2018 0813   CREATININE 0.88 06/30/2020 0855      Component Value  Date/Time   CALCIUM 9.8 06/30/2020 0855   ALKPHOS 64 06/30/2020 0855   AST 20 06/30/2020 0855   ALT 21 06/30/2020 0855   BILITOT 0.3 06/30/2020 0855       Impression and Plan: Michelle Pacheco is a very pleasant 35 yo African American female with history of iron deficiency anemia secondary to heavy cycles and intermittent GI blood loss/malabsorption with Crohn's. Iron studies are pending. We will replace if needed.  Follow-up in 3 months.  She can contact our office with any questions or concerns.   Emeline Gins, NP 7/28/20228:50 AM

## 2020-09-11 ENCOUNTER — Ambulatory Visit: Payer: Medicaid Other | Admitting: Registered"

## 2020-09-11 DIAGNOSIS — Z713 Dietary counseling and surveillance: Secondary | ICD-10-CM

## 2020-09-11 NOTE — Progress Notes (Signed)
Virtual Visit via Video Note  I connected with Michelle Pacheco on 09/11/20 at  0930 AM EDT by a video enabled telemedicine application and verified that I am speaking with the correct person using two identifiers.  Location: Patient: home Provider: [facility] [place]   I discussed the limitations of evaluation and management by telemedicine and the availability of in person appointments. The patient expressed understanding and agreed to proceed.  Employee Visit #3  Appointment Start time: 0930   Appointment End time:  1007   Primary concerns today: build healthy behaviors with concerns of recent diagnosis of Crohns disease (continues) Referral diagnosis: E66.01 Obesity Preferred learning style: no preference indicated Learning readiness: ready   NUTRITION ASSESSMENT    Anthropometrics  Not assessed (virtual visit)    Clinical Medical Hx: Crohns, PCOS, Family Hx T2DM Medications: prenatal, OTC allergy meds, (has not started taking Crohn's medication) Labs: A1c 5.4% 12/28/2018 Notable Signs/Symptoms: weight gain, fatigue   Assessment and Plan: Pt states she wants to get umbilical surgery but the appointment to get cleared had to be rescheduled. Pt feels this is at least part of the source of her stomach issues in addition to the Crohn's.   Patient states she has had an improvement with her BM and has not had to use miralax in a month, was using just about every day.   Pt states she and her husband have increased stress since he got a promotion. His additional responsibilities at work is putting more stress on patient to take care of home and family.  Patient states she was in a lot of stomach pain after eating pizza, tomatoes, spinach, but was short lived. Although she enjoys pizza night with family has been avoiding pizza since then.   Pt states she bought a loaf of bread at the farmers market and ate I over the week, states probably due to stress eating. Pt reports she has added  more seafood in diet. Pt reports cooking more at home and kids ask to participate, going to [employer] with kids.  Pt states she is sleeping better going to sleep by 8 pm and states she has been exhausted in the evenings. Follow Up Instructions: Stress management and ways to help stay focused may help to reduce stress eating and feel better about accomplishing tasks. Continue cooking with the family. Consider finding another Friday night tradition that can pair something that is health-promoting with good memories of family bonding.   I discussed the assessment and treatment plan with the patient. The patient was provided an opportunity to ask questions and all were answered. The patient agreed with the plan and demonstrated an understanding of the instructions.   The patient was advised to call back or seek an in-person evaluation if the symptoms worsen or if the condition fails to improve as anticipated.  I provided 30 minutes of non-face-to-face time during this encounter.   Carolan Shiver, RD, CDCES

## 2020-09-11 NOTE — Patient Instructions (Signed)
Stress management and ways to help stay focused may help to reduce stress eating and feel better about accomplishing tasks. Continue cooking with the family. Consider finding another Friday night tradition that can pair something that is health-promoting with good memories of family bonding.

## 2020-09-17 ENCOUNTER — Other Ambulatory Visit: Payer: Self-pay | Admitting: Physician Assistant

## 2020-09-17 DIAGNOSIS — D509 Iron deficiency anemia, unspecified: Secondary | ICD-10-CM | POA: Diagnosis not present

## 2020-09-17 DIAGNOSIS — K429 Umbilical hernia without obstruction or gangrene: Secondary | ICD-10-CM | POA: Diagnosis not present

## 2020-09-17 DIAGNOSIS — M6208 Separation of muscle (nontraumatic), other site: Secondary | ICD-10-CM | POA: Diagnosis not present

## 2020-09-17 DIAGNOSIS — K50111 Crohn's disease of large intestine with rectal bleeding: Secondary | ICD-10-CM | POA: Diagnosis not present

## 2020-09-23 DIAGNOSIS — K429 Umbilical hernia without obstruction or gangrene: Secondary | ICD-10-CM | POA: Diagnosis not present

## 2020-10-01 ENCOUNTER — Ambulatory Visit
Admission: RE | Admit: 2020-10-01 | Discharge: 2020-10-01 | Disposition: A | Discharge status: Home / Self Care | Payer: 59 | Source: Ambulatory Visit | Attending: Physician Assistant | Admitting: Physician Assistant

## 2020-10-01 DIAGNOSIS — K5090 Crohn's disease, unspecified, without complications: Secondary | ICD-10-CM | POA: Diagnosis not present

## 2020-10-01 DIAGNOSIS — K429 Umbilical hernia without obstruction or gangrene: Secondary | ICD-10-CM

## 2020-10-01 DIAGNOSIS — K6389 Other specified diseases of intestine: Secondary | ICD-10-CM | POA: Diagnosis not present

## 2020-10-01 DIAGNOSIS — K439 Ventral hernia without obstruction or gangrene: Secondary | ICD-10-CM | POA: Diagnosis not present

## 2020-10-01 MED ORDER — IOPAMIDOL (ISOVUE-300) INJECTION 61%
100.0000 mL | Freq: Once | INTRAVENOUS | Status: AC | PRN
  Administered 2020-10-01: 100 mL via INTRAVENOUS

## 2020-10-28 DIAGNOSIS — Z630 Problems in relationship with spouse or partner: Secondary | ICD-10-CM | POA: Diagnosis not present

## 2020-10-28 DIAGNOSIS — F4323 Adjustment disorder with mixed anxiety and depressed mood: Secondary | ICD-10-CM | POA: Diagnosis not present

## 2020-11-04 ENCOUNTER — Other Ambulatory Visit (HOSPITAL_BASED_OUTPATIENT_CLINIC_OR_DEPARTMENT_OTHER): Payer: Self-pay

## 2020-11-04 DIAGNOSIS — K5900 Constipation, unspecified: Secondary | ICD-10-CM | POA: Diagnosis not present

## 2020-11-04 DIAGNOSIS — K5010 Crohn's disease of large intestine without complications: Secondary | ICD-10-CM | POA: Diagnosis not present

## 2020-11-04 DIAGNOSIS — D509 Iron deficiency anemia, unspecified: Secondary | ICD-10-CM | POA: Diagnosis not present

## 2020-11-04 MED ORDER — MESALAMINE ER 0.375 G PO CP24
ORAL_CAPSULE | ORAL | 6 refills | Status: DC
  Filled 2020-11-04 – 2021-02-26 (×2): qty 120, 30d supply, fill #0
  Filled 2021-04-14: qty 120, 30d supply, fill #1
  Filled 2021-06-18 – 2021-06-28 (×2): qty 120, 30d supply, fill #2

## 2020-11-05 DIAGNOSIS — Z630 Problems in relationship with spouse or partner: Secondary | ICD-10-CM | POA: Diagnosis not present

## 2020-11-05 DIAGNOSIS — F4322 Adjustment disorder with anxiety: Secondary | ICD-10-CM | POA: Diagnosis not present

## 2020-11-12 DIAGNOSIS — H1045 Other chronic allergic conjunctivitis: Secondary | ICD-10-CM | POA: Diagnosis not present

## 2020-11-12 DIAGNOSIS — Z630 Problems in relationship with spouse or partner: Secondary | ICD-10-CM | POA: Diagnosis not present

## 2020-11-12 DIAGNOSIS — R21 Rash and other nonspecific skin eruption: Secondary | ICD-10-CM | POA: Diagnosis not present

## 2020-11-12 DIAGNOSIS — J301 Allergic rhinitis due to pollen: Secondary | ICD-10-CM | POA: Diagnosis not present

## 2020-11-12 DIAGNOSIS — F4322 Adjustment disorder with anxiety: Secondary | ICD-10-CM | POA: Diagnosis not present

## 2020-11-12 DIAGNOSIS — J3089 Other allergic rhinitis: Secondary | ICD-10-CM | POA: Diagnosis not present

## 2020-11-16 ENCOUNTER — Other Ambulatory Visit (HOSPITAL_BASED_OUTPATIENT_CLINIC_OR_DEPARTMENT_OTHER): Payer: Self-pay

## 2020-11-18 ENCOUNTER — Other Ambulatory Visit (HOSPITAL_BASED_OUTPATIENT_CLINIC_OR_DEPARTMENT_OTHER): Payer: Self-pay

## 2020-11-18 DIAGNOSIS — K439 Ventral hernia without obstruction or gangrene: Secondary | ICD-10-CM | POA: Diagnosis not present

## 2020-11-18 DIAGNOSIS — K436 Other and unspecified ventral hernia with obstruction, without gangrene: Secondary | ICD-10-CM | POA: Diagnosis not present

## 2020-11-18 MED ORDER — TRAMADOL HCL 50 MG PO TABS
50.0000 mg | ORAL_TABLET | Freq: Four times a day (QID) | ORAL | 0 refills | Status: DC | PRN
  Filled 2020-11-18: qty 20, 5d supply, fill #0

## 2020-11-19 ENCOUNTER — Other Ambulatory Visit (HOSPITAL_BASED_OUTPATIENT_CLINIC_OR_DEPARTMENT_OTHER): Payer: Self-pay

## 2020-11-19 MED ORDER — OXYCODONE HCL 5 MG PO TABS
5.0000 mg | ORAL_TABLET | ORAL | 0 refills | Status: DC | PRN
  Filled 2020-11-19: qty 20, 4d supply, fill #0

## 2020-11-26 DIAGNOSIS — Z630 Problems in relationship with spouse or partner: Secondary | ICD-10-CM | POA: Diagnosis not present

## 2020-11-26 DIAGNOSIS — F4322 Adjustment disorder with anxiety: Secondary | ICD-10-CM | POA: Diagnosis not present

## 2020-12-04 ENCOUNTER — Other Ambulatory Visit (HOSPITAL_BASED_OUTPATIENT_CLINIC_OR_DEPARTMENT_OTHER): Payer: Self-pay

## 2020-12-10 DIAGNOSIS — K5900 Constipation, unspecified: Secondary | ICD-10-CM | POA: Diagnosis not present

## 2020-12-10 DIAGNOSIS — Z6841 Body Mass Index (BMI) 40.0 and over, adult: Secondary | ICD-10-CM | POA: Diagnosis not present

## 2020-12-10 DIAGNOSIS — K649 Unspecified hemorrhoids: Secondary | ICD-10-CM | POA: Diagnosis not present

## 2020-12-10 DIAGNOSIS — Z23 Encounter for immunization: Secondary | ICD-10-CM | POA: Diagnosis not present

## 2020-12-10 DIAGNOSIS — K5010 Crohn's disease of large intestine without complications: Secondary | ICD-10-CM | POA: Diagnosis not present

## 2020-12-10 DIAGNOSIS — E282 Polycystic ovarian syndrome: Secondary | ICD-10-CM | POA: Diagnosis not present

## 2020-12-10 DIAGNOSIS — Z0000 Encounter for general adult medical examination without abnormal findings: Secondary | ICD-10-CM | POA: Diagnosis not present

## 2020-12-10 DIAGNOSIS — F4322 Adjustment disorder with anxiety: Secondary | ICD-10-CM | POA: Diagnosis not present

## 2020-12-10 DIAGNOSIS — D509 Iron deficiency anemia, unspecified: Secondary | ICD-10-CM | POA: Diagnosis not present

## 2020-12-10 DIAGNOSIS — Z630 Problems in relationship with spouse or partner: Secondary | ICD-10-CM | POA: Diagnosis not present

## 2020-12-16 ENCOUNTER — Inpatient Hospital Stay: Payer: 59 | Admitting: Family

## 2020-12-16 ENCOUNTER — Inpatient Hospital Stay: Payer: 59

## 2020-12-19 DIAGNOSIS — Z630 Problems in relationship with spouse or partner: Secondary | ICD-10-CM | POA: Diagnosis not present

## 2020-12-19 DIAGNOSIS — F4322 Adjustment disorder with anxiety: Secondary | ICD-10-CM | POA: Diagnosis not present

## 2020-12-24 ENCOUNTER — Inpatient Hospital Stay: Payer: 59

## 2020-12-29 ENCOUNTER — Other Ambulatory Visit: Payer: Self-pay

## 2020-12-29 ENCOUNTER — Encounter: Payer: Self-pay | Admitting: Family

## 2020-12-29 ENCOUNTER — Other Ambulatory Visit: Payer: 59

## 2020-12-29 ENCOUNTER — Inpatient Hospital Stay: Payer: 59

## 2020-12-29 ENCOUNTER — Inpatient Hospital Stay: Payer: 59 | Attending: Family | Admitting: Family

## 2020-12-29 VITALS — BP 118/93 | HR 91 | Temp 99.1°F | Resp 18 | Wt 241.0 lb

## 2020-12-29 DIAGNOSIS — K922 Gastrointestinal hemorrhage, unspecified: Secondary | ICD-10-CM | POA: Diagnosis not present

## 2020-12-29 DIAGNOSIS — N920 Excessive and frequent menstruation with regular cycle: Secondary | ICD-10-CM | POA: Diagnosis not present

## 2020-12-29 DIAGNOSIS — D500 Iron deficiency anemia secondary to blood loss (chronic): Secondary | ICD-10-CM | POA: Insufficient documentation

## 2020-12-29 DIAGNOSIS — K909 Intestinal malabsorption, unspecified: Secondary | ICD-10-CM | POA: Insufficient documentation

## 2020-12-29 LAB — CBC WITH DIFFERENTIAL (CANCER CENTER ONLY)
Abs Immature Granulocytes: 0.05 10*3/uL (ref 0.00–0.07)
Basophils Absolute: 0 10*3/uL (ref 0.0–0.1)
Basophils Relative: 1 %
Eosinophils Absolute: 0.1 10*3/uL (ref 0.0–0.5)
Eosinophils Relative: 2 %
HCT: 36.6 % (ref 36.0–46.0)
Hemoglobin: 11.7 g/dL — ABNORMAL LOW (ref 12.0–15.0)
Immature Granulocytes: 1 %
Lymphocytes Relative: 30 %
Lymphs Abs: 1.8 10*3/uL (ref 0.7–4.0)
MCH: 26 pg (ref 26.0–34.0)
MCHC: 32 g/dL (ref 30.0–36.0)
MCV: 81.3 fL (ref 80.0–100.0)
Monocytes Absolute: 0.4 10*3/uL (ref 0.1–1.0)
Monocytes Relative: 7 %
Neutro Abs: 3.5 10*3/uL (ref 1.7–7.7)
Neutrophils Relative %: 59 %
Platelet Count: 282 10*3/uL (ref 150–400)
RBC: 4.5 MIL/uL (ref 3.87–5.11)
RDW: 14.6 % (ref 11.5–15.5)
WBC Count: 5.9 10*3/uL (ref 4.0–10.5)
nRBC: 0 % (ref 0.0–0.2)

## 2020-12-29 LAB — RETICULOCYTES
Immature Retic Fract: 11.8 % (ref 2.3–15.9)
RBC.: 4.48 MIL/uL (ref 3.87–5.11)
Retic Count, Absolute: 79.3 10*3/uL (ref 19.0–186.0)
Retic Ct Pct: 1.8 % (ref 0.4–3.1)

## 2020-12-29 NOTE — Progress Notes (Signed)
Hematology and Oncology Follow Up Visit  Michelle Pacheco [id number] [birth year] 35 y.o. 12/29/2020   Principle Diagnosis:  Iron deficiency anemia secondary to heavy cycles and intermittent GI blood loss/malabsorption with Crohn's    Current Therapy:        IV iron as indicated    Interim History:  Michelle Pacheco is here today for follow-up. She is symptomatic with fatigue and states that she has been quite busy with work and her brothers wedding last weekend. Her cycles is quite heavy and she has had some breakthrough bleeding between cycles. She plans to reach out to her gynecologist and discuss treatment options. She missed a dose of her medication for Crohn's last week and had a small flare. She has not noted any obvious blood loss today. No bruising or petechiae.  No fever, chills, n/v, cough, rash, dizziness, SOB, chest pain, abdominal pain or changes in bladder habits. No swelling, tenderness, numbness or tingling in her extremities at this time. No falls or syncope reported.  She is eating well and doing her best to stay well hydrated. Her weight is stable at 241 lbs.   ECOG Performance Status: 1 - Symptomatic but completely ambulatory  Medications:  Allergies as of 12/29/2020       Reactions   Bioflavonoids Hives   Latex Hives   Peanuts [peanut Oil] Swelling   Azithromycin Diarrhea   Citrus Hives   Shellfish Allergy         Medication List        Accurate as of December 29, 2020  1:42 PM. If you have any questions, ask your nurse or doctor.          CitraNatal 90 DHA 90-1 & 300 MG Misc Use as directed once daily   mesalamine 0.375 g 24 hr capsule Commonly known as: APRISO take 4 capsules by mouth in the morning Once a day   mesalamine 0.375 g 24 hr capsule Commonly known as: APRISO Take 4 capsules by mouth in the morning once a day   oxyCODONE 5 MG immediate release tablet Commonly known as: Oxy IR/ROXICODONE Take 1 tablet (5 mg total) by mouth every 4  (four) hours as needed for pain.   polyethylene glycol powder 17 GM/SCOOP powder Commonly known as: GLYCOLAX/MIRALAX See admin instructions.   traMADol 50 MG tablet Commonly known as: ULTRAM Take 1 tablet (50 mg total) by mouth every 6 (six) hours as needed for pain for 5 days.   ZYRTEC PO Take by mouth.        Allergies:  Allergies  Allergen Reactions   Bioflavonoids Hives   Latex Hives   Peanuts [Peanut Oil] Swelling   Azithromycin Diarrhea   Citrus Hives   Shellfish Allergy     Past Medical History, Surgical history, Social history, and Family History were reviewed and updated.  Review of Systems: All other 10 point review of systems is negative.   Physical Exam:  vitals were not taken for this visit.   Wt Readings from Last 3 Encounters:  09/10/20 248 lb (112.5 kg)  06/30/20 246 lb 6.4 oz (111.8 kg)  10/17/19 253 lb (114.8 kg)    Ocular: Sclerae unicteric, pupils equal, round and reactive to light Ear-nose-throat: Oropharynx clear, dentition fair Lymphatic: No cervical or supraclavicular adenopathy Lungs no rales or rhonchi, good excursion bilaterally Heart regular rate and rhythm, no murmur appreciated Abd soft, nontender, positive bowel sounds MSK no focal spinal tenderness, no joint edema Neuro: non-focal, well-oriented, appropriate affect Breasts:  Deferred  Lab Results  Component Value Date   WBC 5.9 12/29/2020   HGB 11.7 (L) 12/29/2020   HCT 36.6 12/29/2020   MCV 81.3 12/29/2020   PLT 282 12/29/2020   Lab Results  Component Value Date   FERRITIN 52 09/10/2020   IRON 86 09/10/2020   TIBC 329 09/10/2020   UIBC 243 09/10/2020   IRONPCTSAT 26 09/10/2020   Lab Results  Component Value Date   RETICCTPCT 1.8 12/29/2020   RBC 4.50 12/29/2020   RBC 4.48 12/29/2020   No results found for: KPAFRELGTCHN, LAMBDASER, KAPLAMBRATIO No results found for: IGGSERUM, IGA, IGMSERUM No results found for: Marda Stalker, SPEI   Chemistry      Component Value Date/Time   NA 138 06/30/2020 0855   NA 140 06/19/2018 0813   K 3.9 06/30/2020 0855   CL 103 06/30/2020 0855   CO2 28 06/30/2020 0855   BUN 14 06/30/2020 0855   BUN 13 06/19/2018 0813   CREATININE 0.88 06/30/2020 0855      Component Value Date/Time   CALCIUM 9.8 06/30/2020 0855   ALKPHOS 64 06/30/2020 0855   AST 20 06/30/2020 0855   ALT 21 06/30/2020 0855   BILITOT 0.3 06/30/2020 0855       Impression and Plan: Michelle Pacheco is a very pleasant 35 yo African American female with history of iron deficiency anemia secondary to heavy cycles and intermittent GI blood loss/malabsorption with Crohn's. Iron studies are pending. We will replace if needed.  Follow-up in 4 months.  She can contact our office with any questions or concerns.   Michelle Stanford, NP 11/15/20221:42 PM

## 2020-12-30 LAB — IRON AND TIBC
Iron: 43 ug/dL (ref 41–142)
Saturation Ratios: 13 % — ABNORMAL LOW (ref 21–57)
TIBC: 335 ug/dL (ref 236–444)
UIBC: 292 ug/dL (ref 120–384)

## 2020-12-30 LAB — FERRITIN: Ferritin: 38 ng/mL (ref 11–307)

## 2020-12-31 DIAGNOSIS — F4322 Adjustment disorder with anxiety: Secondary | ICD-10-CM | POA: Diagnosis not present

## 2020-12-31 DIAGNOSIS — Z630 Problems in relationship with spouse or partner: Secondary | ICD-10-CM | POA: Diagnosis not present

## 2021-01-06 ENCOUNTER — Inpatient Hospital Stay: Payer: 59

## 2021-01-14 ENCOUNTER — Inpatient Hospital Stay: Payer: 59 | Attending: Family

## 2021-01-14 ENCOUNTER — Other Ambulatory Visit: Payer: Self-pay

## 2021-01-14 VITALS — BP 125/77 | HR 72 | Temp 98.4°F | Resp 17

## 2021-01-14 DIAGNOSIS — N920 Excessive and frequent menstruation with regular cycle: Secondary | ICD-10-CM | POA: Insufficient documentation

## 2021-01-14 DIAGNOSIS — D500 Iron deficiency anemia secondary to blood loss (chronic): Secondary | ICD-10-CM | POA: Insufficient documentation

## 2021-01-14 MED ORDER — SODIUM CHLORIDE 0.9 % IV SOLN: Freq: Once | INTRAVENOUS | Status: AC

## 2021-01-14 MED ORDER — SODIUM CHLORIDE 0.9 % IV SOLN
125.0000 mg | Freq: Once | INTRAVENOUS | Status: AC
  Administered 2021-01-14: 125 mg via INTRAVENOUS
  Filled 2021-01-14: qty 125

## 2021-01-14 NOTE — Patient Instructions (Signed)
Sodium Ferric Gluconate Complex Injection ?What is this medication? ?SODIUM FERRIC GLUCONATE COMPLEX (SOE dee um FER ik GLOO koe nate KOM pleks) treats low levels of iron (iron deficiency anemia) in people with kidney disease. Iron is a mineral that plays an important role in making red blood cells, which carry oxygen from your lungs to the rest of your body. ?This medicine may be used for other purposes; ask your health care provider or pharmacist if you have questions. ?COMMON BRAND NAME(S): Ferrlecit, Nulecit ?What should I tell my care team before I take this medication? ?They need to know if you have any of the following conditions: ?Anemia that is not from iron deficiency ?High levels of iron in the blood ?An unusual or allergic reaction to iron, other medications, foods, dyes, or preservatives ?Pregnant or are trying to become pregnant ?Breast-feeding ?How should I use this medication? ?This medication is injected into a vein. It is given by your care team in a hospital or clinic setting. ?Talk to your care team about the use of this medication in children. While it may be prescribed for children as young as 6 years for selected conditions, precautions do apply. ?Overdosage: If you think you have taken too much of this medicine contact a poison control center or emergency room at once. ?NOTE: This medicine is only for you. Do not share this medicine with others. ?What if I miss a dose? ?It is important not to miss your dose. Call your care team if you are unable to keep an appointment. ?What may interact with this medication? ?Do not take this medication with any of the following: ?Deferasirox ?Deferoxamine ?Dimercaprol ?This medication may also interact with the following: ?Other iron products ?This list may not describe all possible interactions. Give your health care provider a list of all the medicines, herbs, non-prescription drugs, or dietary supplements you use. Also tell them if you smoke, drink  alcohol, or use illegal drugs. Some items may interact with your medicine. ?What should I watch for while using this medication? ?Your condition will be monitored carefully while you are receiving this medication. ?Visit your care team for regular checks on your progress. You may need blood work while you are taking this medication. ?What side effects may I notice from receiving this medication? ?Side effects that you should report to your care team as soon as possible: ?Allergic reactions--skin rash, itching, hives, swelling of the face, lips, tongue, or throat ?Low blood pressure--dizziness, feeling faint or lightheaded, blurry vision ?Shortness of breath ?Side effects that usually do not require medical attention (report to your care team if they continue or are bothersome): ?Flushing ?Headache ?Joint pain ?Muscle pain ?Nausea ?Pain, redness, or irritation at injection site ?This list may not describe all possible side effects. Call your doctor for medical advice about side effects. You may report side effects to FDA at 1-800-FDA-1088. ?Where should I keep my medication? ?This medication is given in a hospital or clinic and will not be stored at home. ?NOTE: This sheet is a summary. It may not cover all possible information. If you have questions about this medicine, talk to your doctor, pharmacist, or health care provider. ?? 2022 Elsevier/Gold Standard (2020-06-26 00:00:00) ? ?

## 2021-01-21 ENCOUNTER — Other Ambulatory Visit: Payer: Self-pay

## 2021-01-21 ENCOUNTER — Inpatient Hospital Stay: Payer: 59

## 2021-01-21 VITALS — BP 116/79 | HR 78 | Temp 98.5°F | Resp 17

## 2021-01-21 DIAGNOSIS — N920 Excessive and frequent menstruation with regular cycle: Secondary | ICD-10-CM | POA: Diagnosis not present

## 2021-01-21 DIAGNOSIS — Z630 Problems in relationship with spouse or partner: Secondary | ICD-10-CM | POA: Diagnosis not present

## 2021-01-21 DIAGNOSIS — D500 Iron deficiency anemia secondary to blood loss (chronic): Secondary | ICD-10-CM | POA: Diagnosis not present

## 2021-01-21 DIAGNOSIS — F4322 Adjustment disorder with anxiety: Secondary | ICD-10-CM | POA: Diagnosis not present

## 2021-01-21 MED ORDER — SODIUM CHLORIDE 0.9 % IV SOLN: Freq: Once | INTRAVENOUS | Status: AC

## 2021-01-21 MED ORDER — SODIUM CHLORIDE 0.9 % IV SOLN
125.0000 mg | Freq: Once | INTRAVENOUS | Status: AC
  Administered 2021-01-21: 125 mg via INTRAVENOUS
  Filled 2021-01-21: qty 125

## 2021-01-21 NOTE — Patient Instructions (Signed)

## 2021-01-28 DIAGNOSIS — Z630 Problems in relationship with spouse or partner: Secondary | ICD-10-CM | POA: Diagnosis not present

## 2021-01-28 DIAGNOSIS — F4322 Adjustment disorder with anxiety: Secondary | ICD-10-CM | POA: Diagnosis not present

## 2021-02-11 DIAGNOSIS — Z630 Problems in relationship with spouse or partner: Secondary | ICD-10-CM | POA: Diagnosis not present

## 2021-02-11 DIAGNOSIS — F4321 Adjustment disorder with depressed mood: Secondary | ICD-10-CM | POA: Diagnosis not present

## 2021-02-26 ENCOUNTER — Encounter: Payer: Self-pay | Admitting: Family

## 2021-02-26 ENCOUNTER — Other Ambulatory Visit (HOSPITAL_BASED_OUTPATIENT_CLINIC_OR_DEPARTMENT_OTHER): Payer: Self-pay

## 2021-04-14 ENCOUNTER — Other Ambulatory Visit (HOSPITAL_BASED_OUTPATIENT_CLINIC_OR_DEPARTMENT_OTHER): Payer: Self-pay

## 2021-04-21 ENCOUNTER — Encounter: Payer: Self-pay | Admitting: Family

## 2021-04-28 ENCOUNTER — Encounter: Payer: Self-pay | Admitting: Family

## 2021-04-28 ENCOUNTER — Inpatient Hospital Stay (HOSPITAL_BASED_OUTPATIENT_CLINIC_OR_DEPARTMENT_OTHER): Payer: No Typology Code available for payment source | Admitting: Family

## 2021-04-28 ENCOUNTER — Other Ambulatory Visit: Payer: Self-pay

## 2021-04-28 ENCOUNTER — Inpatient Hospital Stay: Payer: No Typology Code available for payment source | Attending: Family

## 2021-04-28 VITALS — BP 128/96 | HR 80 | Temp 98.2°F | Resp 18 | Ht 64.0 in | Wt 230.1 lb

## 2021-04-28 DIAGNOSIS — D500 Iron deficiency anemia secondary to blood loss (chronic): Secondary | ICD-10-CM | POA: Diagnosis present

## 2021-04-28 DIAGNOSIS — K909 Intestinal malabsorption, unspecified: Secondary | ICD-10-CM | POA: Diagnosis not present

## 2021-04-28 DIAGNOSIS — K922 Gastrointestinal hemorrhage, unspecified: Secondary | ICD-10-CM | POA: Diagnosis not present

## 2021-04-28 DIAGNOSIS — N920 Excessive and frequent menstruation with regular cycle: Secondary | ICD-10-CM | POA: Diagnosis not present

## 2021-04-28 DIAGNOSIS — K5090 Crohn's disease, unspecified, without complications: Secondary | ICD-10-CM | POA: Diagnosis not present

## 2021-04-28 LAB — CBC WITH DIFFERENTIAL (CANCER CENTER ONLY)
Abs Immature Granulocytes: 0.05 10*3/uL (ref 0.00–0.07)
Basophils Absolute: 0 10*3/uL (ref 0.0–0.1)
Basophils Relative: 1 %
Eosinophils Absolute: 0.2 10*3/uL (ref 0.0–0.5)
Eosinophils Relative: 3 %
HCT: 38.1 % (ref 36.0–46.0)
Hemoglobin: 12.3 g/dL (ref 12.0–15.0)
Immature Granulocytes: 1 %
Lymphocytes Relative: 33 %
Lymphs Abs: 1.7 10*3/uL (ref 0.7–4.0)
MCH: 26.6 pg (ref 26.0–34.0)
MCHC: 32.3 g/dL (ref 30.0–36.0)
MCV: 82.5 fL (ref 80.0–100.0)
Monocytes Absolute: 0.3 10*3/uL (ref 0.1–1.0)
Monocytes Relative: 6 %
Neutro Abs: 2.9 10*3/uL (ref 1.7–7.7)
Neutrophils Relative %: 56 %
Platelet Count: 319 10*3/uL (ref 150–400)
RBC: 4.62 MIL/uL (ref 3.87–5.11)
RDW: 14.6 % (ref 11.5–15.5)
WBC Count: 5.1 10*3/uL (ref 4.0–10.5)
nRBC: 0 % (ref 0.0–0.2)

## 2021-04-28 LAB — IRON AND IRON BINDING CAPACITY (CC-WL,HP ONLY)
Iron: 45 ug/dL (ref 28–170)
Saturation Ratios: 12 % (ref 10.4–31.8)
TIBC: 385 ug/dL (ref 250–450)
UIBC: 340 ug/dL (ref 148–442)

## 2021-04-28 LAB — RETICULOCYTES
Immature Retic Fract: 11.9 % (ref 2.3–15.9)
RBC.: 4.58 MIL/uL (ref 3.87–5.11)
Retic Count, Absolute: 71.4 10*3/uL (ref 19.0–186.0)
Retic Ct Pct: 1.6 % (ref 0.4–3.1)

## 2021-04-28 LAB — FERRITIN: Ferritin: 23 ng/mL (ref 11–307)

## 2021-04-28 NOTE — Progress Notes (Signed)
?Hematology and Oncology Follow Up Visit ? ?Michelle Pacheco ?[id number] ?[birth year] 36 y.o. ?04/28/2021 ? ? ?Principle Diagnosis:  ?Iron deficiency anemia secondary to heavy cycles and intermittent GI blood loss/malabsorption with Crohn's  ?  ?Current Therapy:        ?IV iron as indicated  ?  ?Interim History:  Michelle Pacheco is here today for follow-up. She is symptomatic with fatigue.  ?She states that she had a Crohn's flare in February with bright red blood in her stool during those 2 weeks.  ?Her cycle just ended. This is unchanged from baseline.  ?No other blood loss noted. No bruising or petechiae.  ?No fever, chills, n/v, cough, rash, dizziness, SOB, chest pain, palpitations, abdominal pain or changes in bladder habits.  ?No swelling, tenderness, numbness or tingling in her extremities.  ?No falls or syncope.  ?She has maintained a good appetite and is doing her best to stay well hydrated throughout the day. Her weight is stable at 230 lbs. ? ?ECOG Performance Status: 1 - Symptomatic but completely ambulatory ? ?Medications:  ?Allergies as of 04/28/2021   ? ?   Reactions  ? Bioflavonoids Hives  ? Latex Hives  ? Peanuts [peanut Oil] Swelling  ? Azithromycin Diarrhea  ? Citrus Hives  ? Shellfish Allergy   ? ?  ? ?  ?Medication List  ?  ? ?  ? Accurate as of April 28, 2021  9:35 AM. If you have any questions, ask your nurse or doctor.  ?  ?  ? ?  ? ?STOP taking these medications   ? ?CitraNatal 90 DHA 90-1 & 300 MG Misc ?Stopped by: Eileen Stanford, NP ?  ?oxyCODONE 5 MG immediate release tablet ?Commonly known as: Oxy IR/ROXICODONE ?Stopped by: Eileen Stanford, NP ?  ?traMADol 50 MG tablet ?Commonly known as: ULTRAM ?Stopped by: Eileen Stanford, NP ?  ? ?  ? ?TAKE these medications   ? ?mesalamine 0.375 g 24 hr capsule ?Commonly known as: APRISO ?Take 4 capsules by mouth in the morning once a day ?What changed: Another medication with the same name was removed. Continue taking this medication, and follow the directions you  see here. ?Changed by: Eileen Stanford, NP ?  ?polyethylene glycol powder 17 GM/SCOOP powder ?Commonly known as: GLYCOLAX/MIRALAX ?See admin instructions. ?  ?ZYRTEC PO ?Take 1 tablet by mouth daily at 6 (six) AM. ?  ? ?  ? ? ?Allergies:  ?Allergies  ?Allergen Reactions  ? Bioflavonoids Hives  ? Latex Hives  ? Peanuts [Peanut Oil] Swelling  ? Azithromycin Diarrhea  ? Citrus Hives  ? Shellfish Allergy   ? ? ?Past Medical History, Surgical history, Social history, and Family History were reviewed and updated. ? ?Review of Systems: ?All other 10 point review of systems is negative.  ? ?Physical Exam: ? height is 5\' 4"  (1.626 m) and weight is 230 lb 1.9 oz (104.4 kg). Her oral temperature is 98.2 ?F (36.8 ?C). Her blood pressure is 128/96 (abnormal) and her pulse is 80. Her respiration is 18 and oxygen saturation is 100%.  ? ?Wt Readings from Last 3 Encounters:  ?04/28/21 230 lb 1.9 oz (104.4 kg)  ?12/29/20 241 lb (109.3 kg)  ?09/10/20 248 lb (112.5 kg)  ? ? ?Ocular: Sclerae unicteric, pupils equal, round and reactive to light ?Ear-nose-throat: Oropharynx clear, dentition fair ?Lymphatic: No cervical or supraclavicular adenopathy ?Lungs no rales or rhonchi, good excursion bilaterally ?Heart regular rate and rhythm, no murmur appreciated ?Abd soft, nontender, positive bowel sounds ?MSK  no focal spinal tenderness, no joint edema ?Neuro: non-focal, well-oriented, appropriate affect ?Breasts: Deferred  ? ?Lab Results  ?Component Value Date  ? WBC 5.1 04/28/2021  ? HGB 12.3 04/28/2021  ? HCT 38.1 04/28/2021  ? MCV 82.5 04/28/2021  ? PLT 319 04/28/2021  ? ?Lab Results  ?Component Value Date  ? FERRITIN 38 12/29/2020  ? IRON 43 12/29/2020  ? TIBC 335 12/29/2020  ? UIBC 292 12/29/2020  ? IRONPCTSAT 13 (L) 12/29/2020  ? ?Lab Results  ?Component Value Date  ? RETICCTPCT 1.6 04/28/2021  ? RBC 4.58 04/28/2021  ? ?No results found for: KPAFRELGTCHN, LAMBDASER, KAPLAMBRATIO ?No results found for: IGGSERUM, IGA, IGMSERUM ?No results  found for: TOTALPROTELP, ALBUMINELP, A1GS, A2GS, BETS, BETA2SER, GAMS, MSPIKE, SPEI ?  Chemistry   ?   ?Component Value Date/Time  ? NA 138 06/30/2020 0855  ? NA 140 06/19/2018 0813  ? K 3.9 06/30/2020 0855  ? CL 103 06/30/2020 0855  ? CO2 28 06/30/2020 0855  ? BUN 14 06/30/2020 0855  ? BUN 13 06/19/2018 0813  ? CREATININE 0.88 06/30/2020 0855  ?    ?Component Value Date/Time  ? CALCIUM 9.8 06/30/2020 0855  ? ALKPHOS 64 06/30/2020 0855  ? AST 20 06/30/2020 0855  ? ALT 21 06/30/2020 0855  ? BILITOT 0.3 06/30/2020 0855  ?  ? ? ? ?Impression and Plan: Michelle Pacheco is a very pleasant 36 yo African American female with history of iron deficiency anemia secondary to heavy cycles and intermittent GI blood loss/malabsorption with Crohn's. ?Iron studies are pending.  ?Follow-up in 4 months.  ? ?Eileen Stanford, NP ?3/15/20239:35 AM ? ?

## 2021-04-29 ENCOUNTER — Encounter: Payer: Self-pay | Admitting: *Deleted

## 2021-05-03 ENCOUNTER — Encounter: Payer: Self-pay | Admitting: Family

## 2021-05-04 ENCOUNTER — Inpatient Hospital Stay: Payer: No Typology Code available for payment source

## 2021-05-04 ENCOUNTER — Other Ambulatory Visit: Payer: Self-pay

## 2021-05-04 VITALS — BP 125/75 | HR 76 | Temp 98.3°F | Resp 18

## 2021-05-04 DIAGNOSIS — D500 Iron deficiency anemia secondary to blood loss (chronic): Secondary | ICD-10-CM | POA: Diagnosis not present

## 2021-05-04 MED ORDER — SODIUM CHLORIDE 0.9 % IV SOLN
125.0000 mg | Freq: Once | INTRAVENOUS | Status: AC
  Administered 2021-05-04: 125 mg via INTRAVENOUS
  Filled 2021-05-04: qty 125

## 2021-05-04 MED ORDER — SODIUM CHLORIDE 0.9 % IV SOLN: Freq: Once | INTRAVENOUS | Status: AC

## 2021-05-04 NOTE — Patient Instructions (Signed)
Iron Deficiency Anemia, Adult Iron deficiency anemia is when you do not have enough red blood cells or hemoglobin in your blood. This happens because you have too little iron in your body. Hemoglobin carries oxygen to parts of the body. Anemia can cause yourbody to not get enough oxygen. What are the causes? Not eating enough foods that have iron in them. The body not being able to take in iron well. Needing more iron due to pregnancy or heavy menstrual periods, for females. Cancer. Bleeding in the bowels. Many blood draws. What increases the risk? Being pregnant. Being a teenage girl going through a growth spurt. What are the signs or symptoms? Pale skin, lips, and nails. Weakness, dizziness, and getting tired easily. Headache. Feeling like you cannot breathe well when moving (shortness of breath). Cold hands and feet. Fast heartbeat or a heartbeat that is not regular. Feeling grouchy (irritable) or breathing fast. These are more common in very bad anemia. Mild anemia may not cause any symptoms. How is this treated? This condition is treated by finding out why you do not have enough iron and then getting more iron. It may include: Adding foods to your diet that have a lot of iron. Taking iron pills (supplements). If you are pregnant or breastfeeding, you may need to take extra iron. Your diet often does not provide the amount of iron that you need. Getting more vitamin C in your diet. Vitamin C helps your body take in iron. You may need to take iron pills with a glass of orange juice or vitamin C pills. Medicines to make heavy menstrual periods lighter. Surgery. You may need blood tests to see if treatment is working. If the treatment doesnot seem to be working, you may need more tests. Follow these instructions at home: Medicines Take over-the-counter and prescription medicines only as told by your doctor. This includes iron pills and vitamins. Take iron pills when your stomach is  empty. If you cannot handle this, take them with food. Do not drink milk or take antacids at the same time as your iron pills. Iron pills may turn your poop (stool)black. If you cannot handle taking iron pills by mouth, ask your doctor about getting iron through: An IV tube. A shot (injection) into a muscle. Eating and drinking  Talk with your doctor before changing the foods you eat. He or she may tell you to eat foods that have a lot of iron, such as: Liver. Low-fat (lean) beef. Breads and cereals that have iron added to them. Eggs. Dried fruit. Dark green, leafy vegetables. Eat fresh fruits and vegetables that are high in vitamin C. They help your body use iron. Foods with a lot of vitamin C include: Oranges. Peppers. Tomatoes. Mangoes. Drink enough fluid to keep your pee (urine) pale yellow.  Managing constipation If you are taking iron pills, they may cause trouble pooping (constipation). To prevent or treat trouble pooping, you may need to: Take over-the-counter or prescription medicines. Eat foods that are high in fiber. These include beans, whole grains, and fresh fruits and vegetables. Limit foods that are high in fat and sugar. These include fried or sweet foods. General instructions Return to your normal activities as told by your doctor. Ask your doctor what activities are safe for you. Keep yourself clean, and keep things clean around you. Keep all follow-up visits as told by your doctor. This is important. Contact a doctor if: You feel like you may vomit (nauseous), or you vomit. You feel   weak. You are sweating for no reason. You have trouble pooping, such as: Pooping less than 3 times a week. Straining to poop. Having poop that is hard, dry, or larger than normal. Feeling full or bloated. Pain in the lower belly. Not feeling better after pooping. Get help right away if: You pass out (faint). You have chest pain. You have trouble breathing that: Is very  bad. Gets worse with physical activity. You have a fast heartbeat, or a heartbeat that does not feel regular. You get light-headed when getting up from sitting or lying down. These symptoms may be an emergency. Do not wait to see if the symptoms will go away. Get medical help right away. Call your local emergency services (911 in the [place] Do not drive yourself to the hospital. Summary Iron deficiency anemia is when you have too little iron in your body. This condition is treated by finding out why you do not have enough iron in your body and then getting more iron. Take over-the-counter and prescription medicines only as told by your doctor. Eat fresh fruits and vegetables that are high in vitamin C. Get help right away if you cannot breathe well. This information is not intended to replace advice given to you by your health care provider. Make sure you discuss any questions you have with your healthcare provider. Document Revised: 10/09/2018 Document Reviewed: 10/09/2018 Elsevier Patient Education  2022 Elsevier Inc.  

## 2021-05-13 ENCOUNTER — Inpatient Hospital Stay: Payer: No Typology Code available for payment source

## 2021-05-13 VITALS — BP 123/70 | HR 93 | Temp 99.0°F | Resp 18

## 2021-05-13 DIAGNOSIS — D500 Iron deficiency anemia secondary to blood loss (chronic): Secondary | ICD-10-CM | POA: Diagnosis not present

## 2021-05-13 MED ORDER — SODIUM CHLORIDE 0.9 % IV SOLN
125.0000 mg | Freq: Once | INTRAVENOUS | Status: AC
  Administered 2021-05-13: 125 mg via INTRAVENOUS
  Filled 2021-05-13: qty 125

## 2021-05-13 MED ORDER — SODIUM CHLORIDE 0.9 % IV SOLN: Freq: Once | INTRAVENOUS | Status: AC

## 2021-05-13 NOTE — Patient Instructions (Signed)
Sodium Ferric Gluconate Complex Injection ?What is this medication? ?SODIUM FERRIC GLUCONATE COMPLEX (SOE dee um FER ik GLOO koe nate KOM pleks) treats low levels of iron (iron deficiency anemia) in people with kidney disease. Iron is a mineral that plays an important role in making red blood cells, which carry oxygen from your lungs to the rest of your body. ?This medicine may be used for other purposes; ask your health care provider or pharmacist if you have questions. ?COMMON BRAND NAME(S): Ferrlecit, Nulecit ?What should I tell my care team before I take this medication? ?They need to know if you have any of the following conditions: ?Anemia that is not from iron deficiency ?High levels of iron in the blood ?An unusual or allergic reaction to iron, other medications, foods, dyes, or preservatives ?Pregnant or are trying to become pregnant ?Breast-feeding ?How should I use this medication? ?This medication is injected into a vein. It is given by your care team in a hospital or clinic setting. ?Talk to your care team about the use of this medication in children. While it may be prescribed for children as young as 6 years for selected conditions, precautions do apply. ?Overdosage: If you think you have taken too much of this medicine contact a poison control center or emergency room at once. ?NOTE: This medicine is only for you. Do not share this medicine with others. ?What if I miss a dose? ?It is important not to miss your dose. Call your care team if you are unable to keep an appointment. ?What may interact with this medication? ?Do not take this medication with any of the following: ?Deferasirox ?Deferoxamine ?Dimercaprol ?This medication may also interact with the following: ?Other iron products ?This list may not describe all possible interactions. Give your health care provider a list of all the medicines, herbs, non-prescription drugs, or dietary supplements you use. Also tell them if you smoke, drink  alcohol, or use illegal drugs. Some items may interact with your medicine. ?What should I watch for while using this medication? ?Your condition will be monitored carefully while you are receiving this medication. ?Visit your care team for regular checks on your progress. You may need blood work while you are taking this medication. ?What side effects may I notice from receiving this medication? ?Side effects that you should report to your care team as soon as possible: ?Allergic reactions--skin rash, itching, hives, swelling of the face, lips, tongue, or throat ?Low blood pressure--dizziness, feeling faint or lightheaded, blurry vision ?Shortness of breath ?Side effects that usually do not require medical attention (report to your care team if they continue or are bothersome): ?Flushing ?Headache ?Joint pain ?Muscle pain ?Nausea ?Pain, redness, or irritation at injection site ?This list may not describe all possible side effects. Call your doctor for medical advice about side effects. You may report side effects to FDA at 1-800-FDA-1088. ?Where should I keep my medication? ?This medication is given in a hospital or clinic and will not be stored at home. ?NOTE: This sheet is a summary. It may not cover all possible information. If you have questions about this medicine, talk to your doctor, pharmacist, or health care provider. ?? 2022 Elsevier/Gold Standard (2020-06-26 00:00:00) ? ?

## 2021-05-18 ENCOUNTER — Encounter: Payer: Self-pay | Admitting: Family

## 2021-06-14 ENCOUNTER — Encounter: Payer: Self-pay | Admitting: Family

## 2021-06-18 ENCOUNTER — Other Ambulatory Visit (HOSPITAL_BASED_OUTPATIENT_CLINIC_OR_DEPARTMENT_OTHER): Payer: Self-pay

## 2021-06-25 ENCOUNTER — Other Ambulatory Visit (HOSPITAL_BASED_OUTPATIENT_CLINIC_OR_DEPARTMENT_OTHER): Payer: Self-pay

## 2021-06-28 ENCOUNTER — Other Ambulatory Visit (HOSPITAL_BASED_OUTPATIENT_CLINIC_OR_DEPARTMENT_OTHER): Payer: Self-pay

## 2021-07-01 ENCOUNTER — Other Ambulatory Visit (HOSPITAL_BASED_OUTPATIENT_CLINIC_OR_DEPARTMENT_OTHER): Payer: Self-pay

## 2021-07-01 MED ORDER — EPINEPHRINE 0.3 MG/0.3ML IJ SOAJ
INTRAMUSCULAR | 1 refills | Status: DC
  Filled 2021-07-01: qty 4, 4d supply, fill #0

## 2021-07-01 MED ORDER — TRIAMCINOLONE ACETONIDE 0.1 % EX OINT
TOPICAL_OINTMENT | CUTANEOUS | 5 refills | Status: DC
  Filled 2021-07-01: qty 60, 30d supply, fill #0

## 2021-07-15 ENCOUNTER — Other Ambulatory Visit (HOSPITAL_BASED_OUTPATIENT_CLINIC_OR_DEPARTMENT_OTHER): Payer: Self-pay

## 2021-07-15 MED ORDER — MESALAMINE ER 0.375 G PO CP24
ORAL_CAPSULE | ORAL | 3 refills | Status: DC
  Filled 2021-07-15 – 2021-07-22 (×3): qty 360, 90d supply, fill #0

## 2021-07-22 ENCOUNTER — Other Ambulatory Visit (HOSPITAL_BASED_OUTPATIENT_CLINIC_OR_DEPARTMENT_OTHER): Payer: Self-pay

## 2021-07-23 ENCOUNTER — Other Ambulatory Visit (HOSPITAL_BASED_OUTPATIENT_CLINIC_OR_DEPARTMENT_OTHER): Payer: Self-pay

## 2021-07-27 ENCOUNTER — Other Ambulatory Visit (HOSPITAL_BASED_OUTPATIENT_CLINIC_OR_DEPARTMENT_OTHER): Payer: Self-pay

## 2021-07-30 ENCOUNTER — Other Ambulatory Visit (HOSPITAL_BASED_OUTPATIENT_CLINIC_OR_DEPARTMENT_OTHER): Payer: Self-pay

## 2021-07-30 MED ORDER — NORGESTIM-ETH ESTRAD TRIPHASIC 0.18/0.215/0.25 MG-35 MCG PO TABS
ORAL_TABLET | ORAL | 3 refills | Status: DC
Start: 2021-07-30 — End: 2022-08-08
  Filled 2021-07-30: qty 84, 84d supply, fill #0

## 2021-09-01 ENCOUNTER — Other Ambulatory Visit: Payer: Medicaid Other

## 2021-09-01 ENCOUNTER — Ambulatory Visit: Payer: Medicaid Other | Admitting: Family

## 2021-09-02 ENCOUNTER — Other Ambulatory Visit: Payer: Self-pay

## 2021-09-02 ENCOUNTER — Encounter: Payer: Self-pay | Admitting: Family

## 2021-09-02 ENCOUNTER — Inpatient Hospital Stay (HOSPITAL_BASED_OUTPATIENT_CLINIC_OR_DEPARTMENT_OTHER): Payer: No Typology Code available for payment source | Admitting: Family

## 2021-09-02 ENCOUNTER — Telehealth: Payer: Self-pay | Admitting: *Deleted

## 2021-09-02 ENCOUNTER — Inpatient Hospital Stay: Payer: No Typology Code available for payment source | Attending: Hematology & Oncology

## 2021-09-02 VITALS — BP 117/78 | HR 70 | Temp 98.1°F | Resp 18 | Ht 64.0 in | Wt 220.1 lb

## 2021-09-02 DIAGNOSIS — D500 Iron deficiency anemia secondary to blood loss (chronic): Secondary | ICD-10-CM | POA: Insufficient documentation

## 2021-09-02 DIAGNOSIS — K909 Intestinal malabsorption, unspecified: Secondary | ICD-10-CM | POA: Insufficient documentation

## 2021-09-02 DIAGNOSIS — K5090 Crohn's disease, unspecified, without complications: Secondary | ICD-10-CM | POA: Diagnosis not present

## 2021-09-02 DIAGNOSIS — N920 Excessive and frequent menstruation with regular cycle: Secondary | ICD-10-CM | POA: Diagnosis not present

## 2021-09-02 LAB — CBC WITH DIFFERENTIAL (CANCER CENTER ONLY)
Abs Immature Granulocytes: 0.02 10*3/uL (ref 0.00–0.07)
Basophils Absolute: 0 10*3/uL (ref 0.0–0.1)
Basophils Relative: 0 %
Eosinophils Absolute: 0.2 10*3/uL (ref 0.0–0.5)
Eosinophils Relative: 3 %
HCT: 37.6 % (ref 36.0–46.0)
Hemoglobin: 12 g/dL (ref 12.0–15.0)
Immature Granulocytes: 0 %
Lymphocytes Relative: 27 %
Lymphs Abs: 1.5 10*3/uL (ref 0.7–4.0)
MCH: 26.8 pg (ref 26.0–34.0)
MCHC: 31.9 g/dL (ref 30.0–36.0)
MCV: 83.9 fL (ref 80.0–100.0)
Monocytes Absolute: 0.4 10*3/uL (ref 0.1–1.0)
Monocytes Relative: 7 %
Neutro Abs: 3.5 10*3/uL (ref 1.7–7.7)
Neutrophils Relative %: 63 %
Platelet Count: 274 10*3/uL (ref 150–400)
RBC: 4.48 MIL/uL (ref 3.87–5.11)
RDW: 14.4 % (ref 11.5–15.5)
WBC Count: 5.5 10*3/uL (ref 4.0–10.5)
nRBC: 0 % (ref 0.0–0.2)

## 2021-09-02 LAB — FERRITIN: Ferritin: 17 ng/mL (ref 11–307)

## 2021-09-02 LAB — RETICULOCYTES
Immature Retic Fract: 7.2 % (ref 2.3–15.9)
RBC.: 4.47 MIL/uL (ref 3.87–5.11)
Retic Count, Absolute: 56.8 10*3/uL (ref 19.0–186.0)
Retic Ct Pct: 1.3 % (ref 0.4–3.1)

## 2021-09-02 LAB — IRON AND IRON BINDING CAPACITY (CC-WL,HP ONLY)
Iron: 41 ug/dL (ref 28–170)
Saturation Ratios: 10 % — ABNORMAL LOW (ref 10.4–31.8)
TIBC: 405 ug/dL (ref 250–450)
UIBC: 364 ug/dL (ref 148–442)

## 2021-09-02 NOTE — Progress Notes (Signed)
Hematology and Oncology Follow Up Visit  Michelle Pacheco [id number] 1985/09/05 36 y.o. 09/02/2021   Principle Diagnosis:  Iron deficiency anemia secondary to heavy cycles and intermittent GI blood loss/malabsorption with Crohn's    Current Therapy:        IV iron as indicated    Interim History:  Michelle Pacheco is here today for follow-up. She is symptomatic with fatigue.  She recently had a Crohn's flare with blood in her stool.  She has started oral birth control and is hoping this helps reduce her cycle. She has had some breakthrough bleeding.  No fever, chills, n/v, cough, rash, dizziness, SOB, chest pain, palpitations or changes in bladder habits.  No swelling, tenderness, numbness or tingling in her extremities.  No falls or syncope.  She has maintained a good appetite and is staying well hydrated. Her weight is stable at 220 lbs.   ECOG Performance Status: 1 - Symptomatic but completely ambulatory  Medications:  Allergies as of 09/02/2021       Reactions   Bioflavonoids Hives   Latex Hives   Peanuts [peanut Oil] Swelling   Azithromycin Diarrhea   Citrus Hives   Shellfish Allergy         Medication List        Accurate as of September 02, 2021  9:51 AM. If you have any questions, ask your nurse or doctor.          EPINEPHrine 0.3 mg/0.3 mL Soaj injection Commonly known as: EPI-PEN Use as directed as needed for systemic recations   mesalamine 0.375 g 24 hr capsule Commonly known as: APRISO Take 4 capsules by mouth in the morning once a day   polyethylene glycol powder 17 GM/SCOOP powder Commonly known as: GLYCOLAX/MIRALAX See admin instructions.   Tri-VyLibra 0.18/0.215/0.25 MG-35 MCG tablet Generic drug: Norgestimate-Ethinyl Estradiol Triphasic Take 1 tablet by mouth once daily   triamcinolone ointment 0.1 % Commonly known as: KENALOG Apply 1 application on to the skin 2 times daily or as needed to the body only   ZYRTEC PO Take 1 tablet by mouth daily  at 6 (six) AM.        Allergies:  Allergies  Allergen Reactions   Bioflavonoids Hives   Latex Hives   Peanuts [Peanut Oil] Swelling   Azithromycin Diarrhea   Citrus Hives   Shellfish Allergy     Past Medical History, Surgical history, Social history, and Family History were reviewed and updated.  Review of Systems: All other 10 point review of systems is negative.   Physical Exam:  height is 5\' 4"  (1.626 m) and weight is 220 lb 1.9 oz (99.8 kg). Her oral temperature is 98.1 F (36.7 C). Her blood pressure is 117/78 and her pulse is 70. Her respiration is 18 and oxygen saturation is 100%.   Wt Readings from Last 3 Encounters:  09/02/21 220 lb 1.9 oz (99.8 kg)  04/28/21 230 lb 1.9 oz (104.4 kg)  12/29/20 241 lb (109.3 kg)    Ocular: Sclerae unicteric, pupils equal, round and reactive to light Ear-nose-throat: Oropharynx clear, dentition fair Lymphatic: No cervical or supraclavicular adenopathy Lungs no rales or rhonchi, good excursion bilaterally Heart regular rate and rhythm, no murmur appreciated Abd soft, nontender, positive bowel sounds MSK no focal spinal tenderness, no joint edema Neuro: non-focal, well-oriented, appropriate affect Breasts: Deferred   Lab Results  Component Value Date   WBC 5.5 09/02/2021   HGB 12.0 09/02/2021   HCT 37.6 09/02/2021   MCV 83.9  09/02/2021   PLT 274 09/02/2021   Lab Results  Component Value Date   FERRITIN 23 04/28/2021   IRON 45 04/28/2021   TIBC 385 04/28/2021   UIBC 340 04/28/2021   IRONPCTSAT 12 04/28/2021   Lab Results  Component Value Date   RETICCTPCT 1.3 09/02/2021   RBC 4.47 09/02/2021   RBC 4.48 09/02/2021   No results found for: "KPAFRELGTCHN", "LAMBDASER", "KAPLAMBRATIO" No results found for: "IGGSERUM", "IGA", "IGMSERUM" No results found for: "TOTALPROTELP", "ALBUMINELP", "A1GS", "A2GS", "BETS", "BETA2SER", "GAMS", "MSPIKE", "SPEI"   Chemistry      Component Value Date/Time   NA 138 06/30/2020 0855    NA 140 06/19/2018 0813   K 3.9 06/30/2020 0855   CL 103 06/30/2020 0855   CO2 28 06/30/2020 0855   BUN 14 06/30/2020 0855   BUN 13 06/19/2018 0813   CREATININE 0.88 06/30/2020 0855      Component Value Date/Time   CALCIUM 9.8 06/30/2020 0855   ALKPHOS 64 06/30/2020 0855   AST 20 06/30/2020 0855   ALT 21 06/30/2020 0855   BILITOT 0.3 06/30/2020 0855       Impression and Plan: Michelle Pacheco is a very pleasant 36 yo African American female with history of iron deficiency anemia secondary to heavy cycles and intermittent GI blood loss/malabsorption with Crohn's. Iron studies are pending.  Follow-up 4 months.   Michelle Stanford, NP 7/20/20239:51 AM

## 2021-09-02 NOTE — Telephone Encounter (Signed)
Per 09/02/21 los - gave upcoming appointments - confirmed 

## 2021-09-03 IMAGING — CT CT ENTEROGRAPHY (ABD-PELV W/ CM)
3 of 4 series · 13 of 32 positions shown, 18 images · IV contrast (APPLIED)
Comparison: Pelvic ultrasound 12/26/2017.

CLINICAL DATA: Crohn's disease diagnosed 5 months ago. Intermittent
bloody stool. History of umbilical hernia x2.

EXAM:
CT ABDOMEN AND PELVIS WITH CONTRAST (ENTEROGRAPHY)
TECHNIQUE: Multidetector CT of the abdomen and pelvis during bolus
administration of intravenous contrast. Negative oral contrast was
given.
CONTRAST:  100mL [id number] IOPAMIDOL ([id number]) INJECTION 61%

[Series 2: enterography 5mm · axial · 0.85mm/px · z∈[-335,-180]mm · 2 of 95 slices shown]
[im 32/95  soft-tissue]
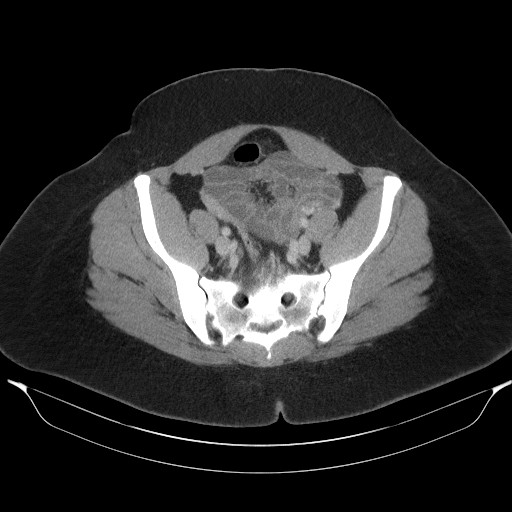
[im 63/95  soft-tissue]
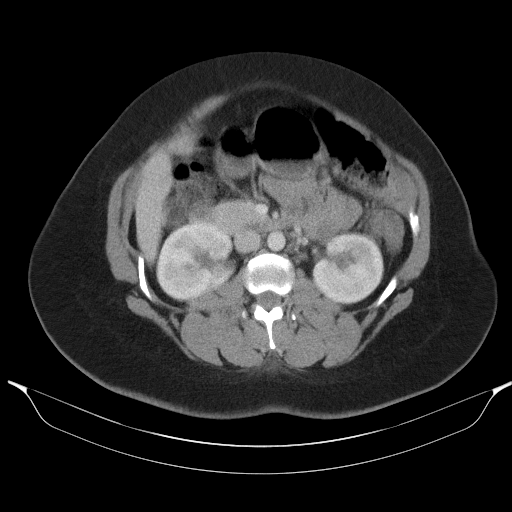

[Series 3: enterography thins 2mm · axial · 0.85mm/px · z∈[-432,-80]mm · 7 of 236 slices shown, 12 images]
[im 30/236  soft-tissue]
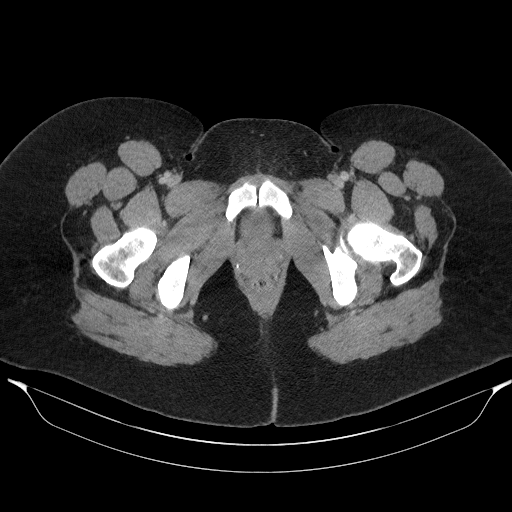
[im 30/236  bone]
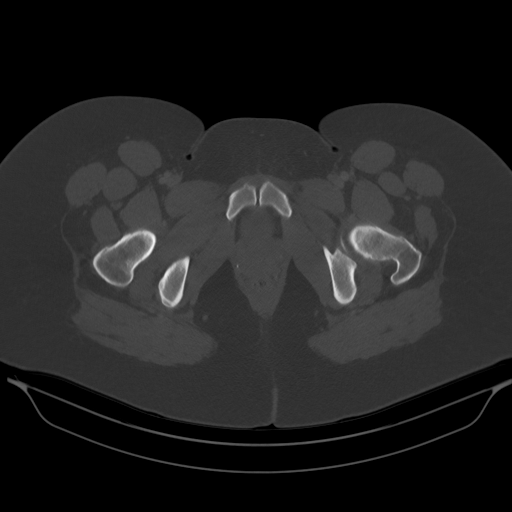
[im 59/236  soft-tissue]
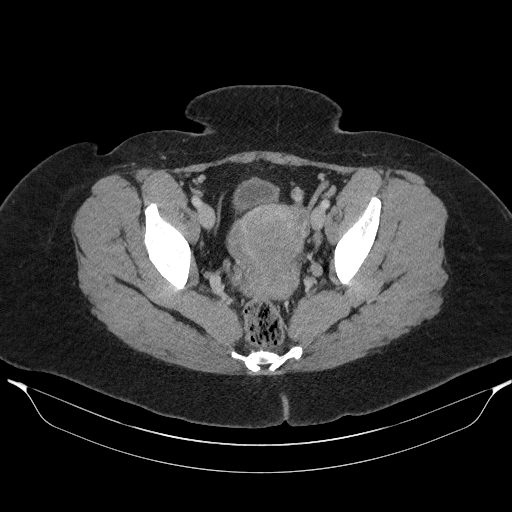
[im 89/236  soft-tissue]
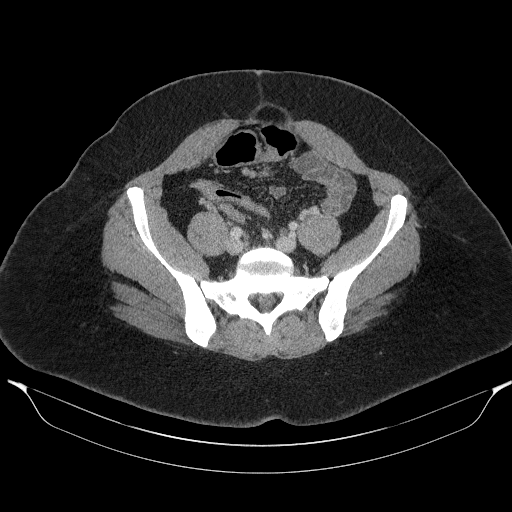
[im 118/236  soft-tissue]
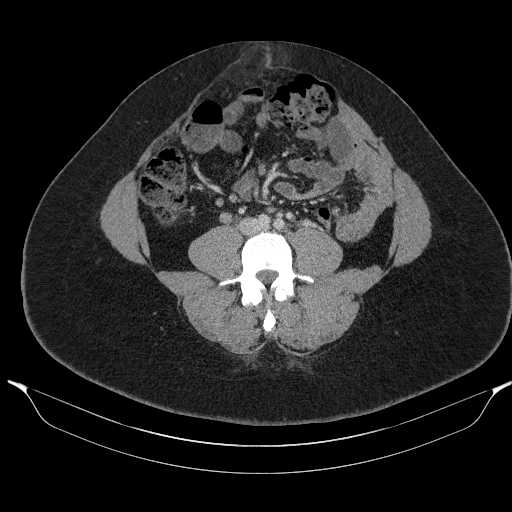
[im 118/236  lung]
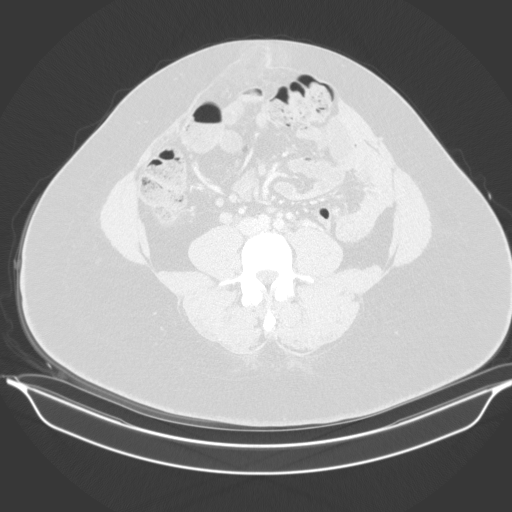
[im 147/236  soft-tissue]
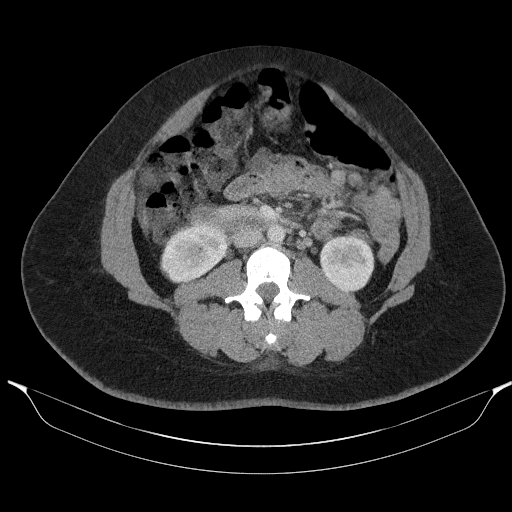
[im 147/236  lung]
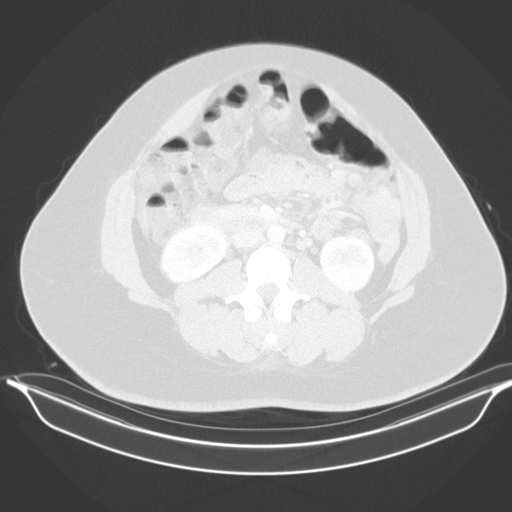
[im 177/236  soft-tissue]
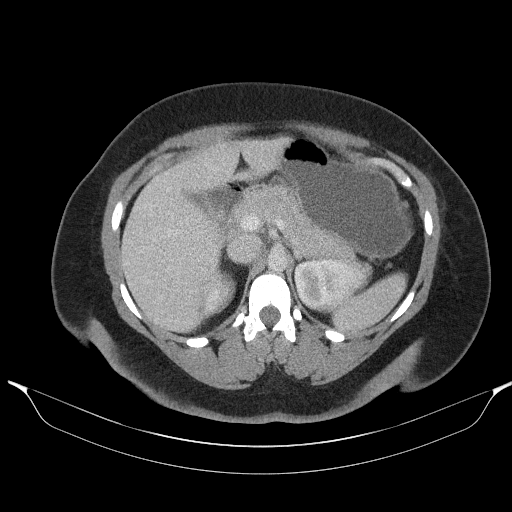
[im 177/236  lung]
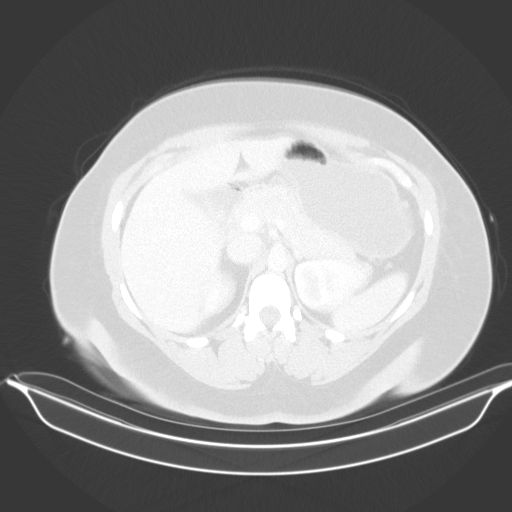
[im 206/236  soft-tissue]
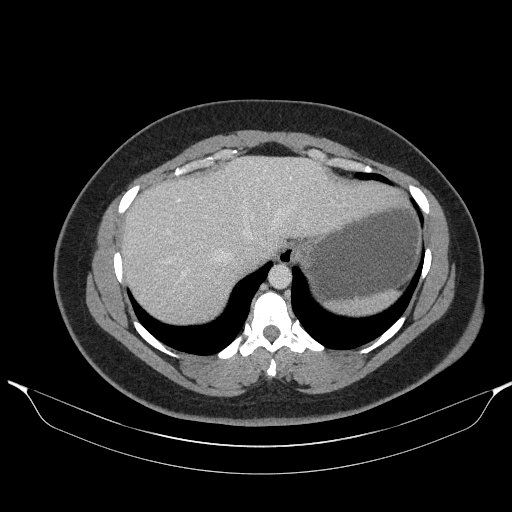
[im 206/236  lung]
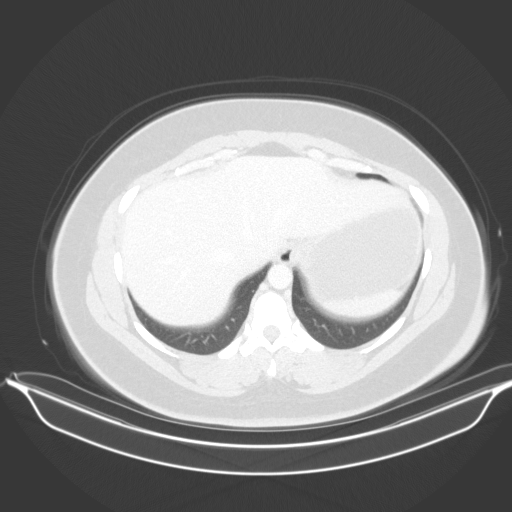

[Series 5: thins · axial · 0.75mm/px · z∈[-424,-262]mm · 4 of 458 slices shown]
[im 54/458  soft-tissue]
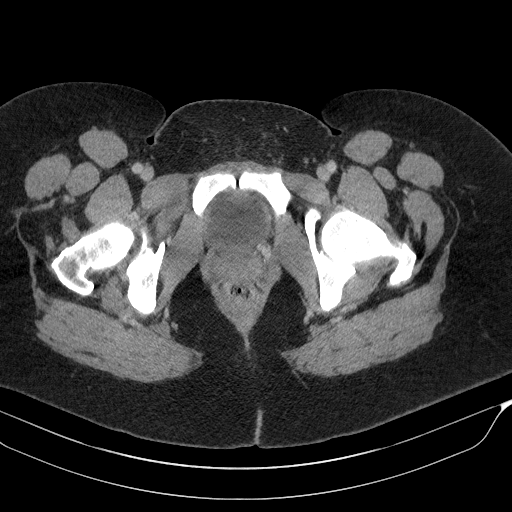
[im 108/458  soft-tissue]
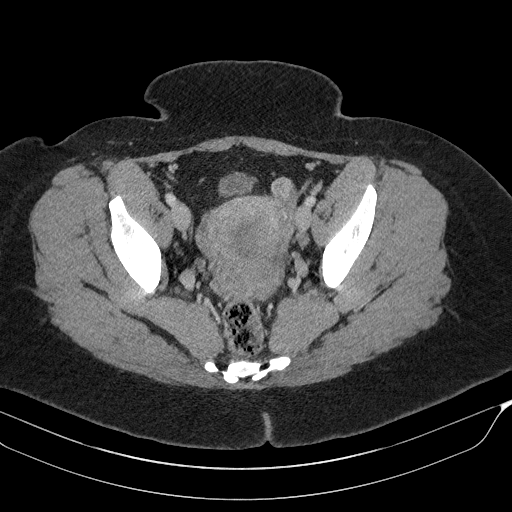
[im 162/458  soft-tissue]
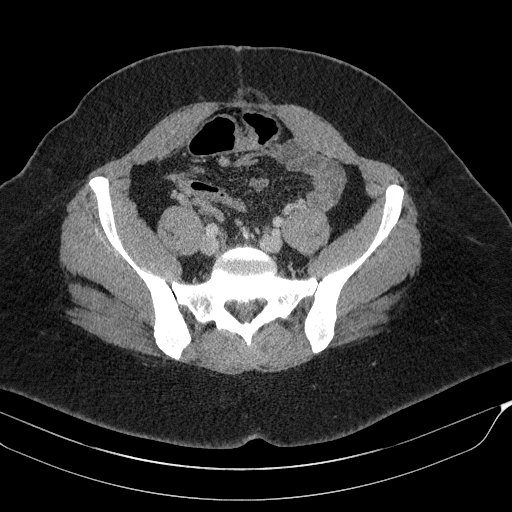
[im 216/458  soft-tissue]
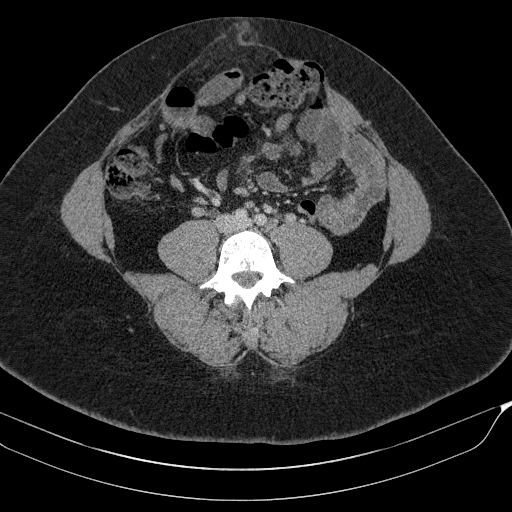

[13 of 32 positions shown; findings below may reference images not displayed]

FINDINGS: Lower chest: Clear lung bases. No significant pleural or pericardial
effusion.

Hepatobiliary: Images through the upper abdomen are mildly degraded
by breathing artifact. The liver is normal in density without
suspicious focal abnormality. No evidence of gallstones, gallbladder
wall thickening or biliary dilatation.

Pancreas: Unremarkable. No pancreatic ductal dilatation or
surrounding inflammatory changes.

Spleen: Normal in size without focal abnormality.

Adrenals/Urinary Tract: Both adrenal glands appear normal. The
kidneys appear normal without evidence of urinary tract calculus,
suspicious lesion or hydronephrosis. No bladder abnormalities are
seen.

Stomach/Bowel: Adequate distention of the stomach and small bowel.
The stomach appears unremarkable for its degree of distension. No
evidence of bowel wall thickening, distention or surrounding
inflammatory change. The appendix appears normal.

Vascular/Lymphatic: There are no enlarged abdominal or pelvic lymph
nodes. Multiple small retroperitoneal and inguinal lymph nodes are
not pathologically enlarged. No significant vascular findings.

Reproductive: The uterus and ovaries appear normal. No adnexal mass.

Other: There are postsurgical changes consistent with previous
umbilical hernia repair. There is diastasis of the rectus abdominus
muscles with anterior protuberance of the intra-abdominal contents.
There is a small recurrent central umbilical hernia containing fat.
The neck of this hernia measures approximately 1.8 cm on image 48/2.
No herniated bowel.

Musculoskeletal: No acute or significant osseous findings.
IMPRESSION: 1. No acute findings or evidence of active bowel inflammation.
2. Status post umbilical hernia repair with a recurrent small
umbilical hernia containing fat. There is broad-based protuberance
of the anterior abdomen with diastasis of the rectus abdominus
muscles.

## 2021-09-08 ENCOUNTER — Inpatient Hospital Stay: Payer: No Typology Code available for payment source

## 2021-09-08 ENCOUNTER — Ambulatory Visit: Payer: No Typology Code available for payment source | Admitting: Registered"

## 2021-09-08 ENCOUNTER — Encounter: Payer: No Typology Code available for payment source | Attending: Family Medicine | Admitting: Registered"

## 2021-09-08 VITALS — BP 119/85 | HR 74 | Temp 97.9°F | Resp 17

## 2021-09-08 DIAGNOSIS — D500 Iron deficiency anemia secondary to blood loss (chronic): Secondary | ICD-10-CM

## 2021-09-08 DIAGNOSIS — E669 Obesity, unspecified: Secondary | ICD-10-CM | POA: Diagnosis present

## 2021-09-08 MED ORDER — SODIUM CHLORIDE 0.9 % IV SOLN: Freq: Once | INTRAVENOUS | Status: AC

## 2021-09-08 MED ORDER — SODIUM CHLORIDE 0.9 % IV SOLN
125.0000 mg | Freq: Once | INTRAVENOUS | Status: AC
  Administered 2021-09-08: 125 mg via INTRAVENOUS
  Filled 2021-09-08: qty 10

## 2021-09-08 NOTE — Patient Instructions (Signed)
Sodium Ferric Gluconate Complex Injection What is this medication? SODIUM FERRIC GLUCONATE COMPLEX (SOE dee um FER ik GLOO koe nate KOM pleks) treats low levels of iron (iron deficiency anemia) in people with kidney disease. Iron is a mineral that plays an important role in making red blood cells, which carry oxygen from your lungs to the rest of your body. This medicine may be used for other purposes; ask your health care provider or pharmacist if you have questions. COMMON BRAND NAME(S): Ferrlecit, Nulecit What should I tell my care team before I take this medication? They need to know if you have any of the following conditions: Anemia that is not from iron deficiency High levels of iron in the blood An unusual or allergic reaction to iron, other medications, foods, dyes, or preservatives Pregnant or are trying to become pregnant Breast-feeding How should I use this medication? This medication is injected into a vein. It is given by your care team in a hospital or clinic setting. Talk to your care team about the use of this medication in children. While it may be prescribed for children as young as 6 years for selected conditions, precautions do apply. Overdosage: If you think you have taken too much of this medicine contact a poison control center or emergency room at once. NOTE: This medicine is only for you. Do not share this medicine with others. What if I miss a dose? It is important not to miss your dose. Call your care team if you are unable to keep an appointment. What may interact with this medication? Do not take this medication with any of the following: Deferasirox Deferoxamine Dimercaprol This medication may also interact with the following: Other iron products This list may not describe all possible interactions. Give your health care provider a list of all the medicines, herbs, non-prescription drugs, or dietary supplements you use. Also tell them if you smoke, drink  alcohol, or use illegal drugs. Some items may interact with your medicine. What should I watch for while using this medication? Your condition will be monitored carefully while you are receiving this medication. Visit your care team for regular checks on your progress. You may need blood work while you are taking this medication. What side effects may I notice from receiving this medication? Side effects that you should report to your care team as soon as possible: Allergic reactions--skin rash, itching, hives, swelling of the face, lips, tongue, or throat Low blood pressure--dizziness, feeling faint or lightheaded, blurry vision Shortness of breath Side effects that usually do not require medical attention (report to your care team if they continue or are bothersome): Flushing Headache Joint pain Muscle pain Nausea Pain, redness, or irritation at injection site This list may not describe all possible side effects. Call your doctor for medical advice about side effects. You may report side effects to FDA at 1-800-FDA-1088. Where should I keep my medication? This medication is given in a hospital or clinic and will not be stored at home. NOTE: This sheet is a summary. It may not cover all possible information. If you have questions about this medicine, talk to your doctor, pharmacist, or health care provider.  2023 Elsevier/Gold Standard (2020-06-26 00:00:00)  

## 2021-09-08 NOTE — Progress Notes (Signed)
Virtual Visit via Video Note  I connected with Michelle Pacheco on 09/08/21 at  3:45 PM EDT by a video enabled telemedicine application and verified that I am speaking with the correct person using two identifiers.  Location: Patient: in car driving home Provider: [facility]  I discussed the limitations of evaluation and management by telemedicine and the availability of in person appointments. The patient expressed understanding and agreed to proceed.  History of Present Illness: Pt states hernia surgery. The pain was due to Crohn's. Has lost about 20 lbs because she can't eat certain things. Doesn't have motivation to exercise, fatigue.   Pt states she is eating on the go and looking for advice on how to be consistent with developing structured habits wants balance and lifestyle change.  Pt states wants ideas for snacks and is frustrated by limitations to diet including nut and avocado allergy. Has been craving soda, reg or diet, lately.  Pt stated due to iron deficiency, started OCP to stop periods. Pt reports supplements can be harsh on gut and make Crohn's worse.  Pt states she continues to work through lunch, feels pressure to keep her Teams status active and feels working from home is a privilege.   Assessment and Plan: Continue to explore mental barriers to self-care including taking lunch breaks at work.  Follow Up Instructions:  I discussed the assessment and treatment plan with the patient. The patient was provided an opportunity to ask questions and all were answered. The patient agreed with the plan and demonstrated an understanding of the instructions.   The patient was advised to call back or seek an in-person evaluation if the symptoms worsen or if the condition fails to improve as anticipated.  I provided 45 minutes of non-face-to-face time during this encounter.  Heywood Bene, RD, LDN, CDCES

## 2021-09-22 ENCOUNTER — Inpatient Hospital Stay: Payer: No Typology Code available for payment source | Attending: Hematology & Oncology

## 2021-09-22 VITALS — BP 117/70 | HR 73 | Temp 97.9°F | Resp 16

## 2021-09-22 DIAGNOSIS — D500 Iron deficiency anemia secondary to blood loss (chronic): Secondary | ICD-10-CM | POA: Diagnosis present

## 2021-09-22 DIAGNOSIS — N920 Excessive and frequent menstruation with regular cycle: Secondary | ICD-10-CM | POA: Insufficient documentation

## 2021-09-22 MED ORDER — SODIUM CHLORIDE 0.9 % IV SOLN
125.0000 mg | Freq: Once | INTRAVENOUS | Status: AC
  Administered 2021-09-22: 125 mg via INTRAVENOUS
  Filled 2021-09-22: qty 10

## 2021-09-22 MED ORDER — SODIUM CHLORIDE 0.9 % IV SOLN: Freq: Once | INTRAVENOUS | Status: AC

## 2021-09-22 NOTE — Patient Instructions (Signed)

## 2021-09-28 ENCOUNTER — Other Ambulatory Visit (HOSPITAL_BASED_OUTPATIENT_CLINIC_OR_DEPARTMENT_OTHER): Payer: Self-pay

## 2021-09-28 MED ORDER — LEVONORGEST-ETH ESTRAD 91-DAY 0.15-0.03 MG PO TABS
ORAL_TABLET | ORAL | 4 refills | Status: DC
  Filled 2021-09-28: qty 91, 84d supply, fill #0

## 2021-11-02 ENCOUNTER — Other Ambulatory Visit (HOSPITAL_BASED_OUTPATIENT_CLINIC_OR_DEPARTMENT_OTHER): Payer: Self-pay

## 2021-11-02 MED ORDER — MISOPROSTOL 100 MCG PO TABS
ORAL_TABLET | ORAL | 0 refills | Status: DC
  Filled 2021-11-02: qty 2, 1d supply, fill #0

## 2021-11-03 ENCOUNTER — Other Ambulatory Visit (HOSPITAL_BASED_OUTPATIENT_CLINIC_OR_DEPARTMENT_OTHER): Payer: Self-pay

## 2021-11-04 ENCOUNTER — Other Ambulatory Visit (HOSPITAL_BASED_OUTPATIENT_CLINIC_OR_DEPARTMENT_OTHER): Payer: Self-pay

## 2021-11-12 ENCOUNTER — Other Ambulatory Visit (HOSPITAL_BASED_OUTPATIENT_CLINIC_OR_DEPARTMENT_OTHER): Payer: Self-pay

## 2021-11-26 ENCOUNTER — Other Ambulatory Visit (HOSPITAL_BASED_OUTPATIENT_CLINIC_OR_DEPARTMENT_OTHER): Payer: Self-pay

## 2021-11-26 MED ORDER — TRANEXAMIC ACID 650 MG PO TABS
1300.0000 mg | ORAL_TABLET | Freq: Three times a day (TID) | ORAL | 6 refills | Status: DC
  Filled 2021-11-26: qty 30, 5d supply, fill #0

## 2021-11-29 ENCOUNTER — Other Ambulatory Visit (HOSPITAL_BASED_OUTPATIENT_CLINIC_OR_DEPARTMENT_OTHER): Payer: Self-pay

## 2021-12-02 ENCOUNTER — Other Ambulatory Visit (HOSPITAL_BASED_OUTPATIENT_CLINIC_OR_DEPARTMENT_OTHER): Payer: Self-pay

## 2021-12-09 ENCOUNTER — Other Ambulatory Visit (HOSPITAL_BASED_OUTPATIENT_CLINIC_OR_DEPARTMENT_OTHER): Payer: Self-pay

## 2021-12-09 ENCOUNTER — Encounter: Payer: Self-pay | Admitting: Family

## 2021-12-09 MED ORDER — FLUARIX QUADRIVALENT 0.5 ML IM SUSY
PREFILLED_SYRINGE | INTRAMUSCULAR | 0 refills | Status: DC
  Filled 2021-12-09: qty 0.5, 1d supply, fill #0

## 2021-12-14 ENCOUNTER — Other Ambulatory Visit (HOSPITAL_BASED_OUTPATIENT_CLINIC_OR_DEPARTMENT_OTHER): Payer: Self-pay

## 2021-12-14 MED ORDER — TRANEXAMIC ACID 650 MG PO TABS
650.0000 mg | ORAL_TABLET | Freq: Three times a day (TID) | ORAL | 5 refills | Status: DC
  Filled 2021-12-14: qty 30, 10d supply, fill #0

## 2021-12-22 ENCOUNTER — Other Ambulatory Visit (HOSPITAL_BASED_OUTPATIENT_CLINIC_OR_DEPARTMENT_OTHER): Payer: Self-pay

## 2021-12-22 MED ORDER — ESCITALOPRAM OXALATE 10 MG PO TABS
ORAL_TABLET | ORAL | 3 refills | Status: DC
  Filled 2021-12-22: qty 30, 30d supply, fill #0

## 2021-12-24 ENCOUNTER — Other Ambulatory Visit (HOSPITAL_BASED_OUTPATIENT_CLINIC_OR_DEPARTMENT_OTHER): Payer: Self-pay

## 2021-12-28 ENCOUNTER — Encounter (INDEPENDENT_AMBULATORY_CARE_PROVIDER_SITE_OTHER): Payer: Self-pay

## 2022-01-05 ENCOUNTER — Inpatient Hospital Stay: Payer: No Typology Code available for payment source | Admitting: Family

## 2022-01-05 ENCOUNTER — Inpatient Hospital Stay: Payer: No Typology Code available for payment source

## 2022-01-12 ENCOUNTER — Inpatient Hospital Stay: Payer: No Typology Code available for payment source

## 2022-01-12 ENCOUNTER — Inpatient Hospital Stay: Payer: No Typology Code available for payment source | Attending: Family | Admitting: Family

## 2022-01-12 ENCOUNTER — Encounter: Payer: Self-pay | Admitting: Family

## 2022-01-12 VITALS — BP 132/84 | HR 69 | Temp 98.1°F | Resp 17 | Ht 64.0 in

## 2022-01-12 DIAGNOSIS — N920 Excessive and frequent menstruation with regular cycle: Secondary | ICD-10-CM | POA: Insufficient documentation

## 2022-01-12 DIAGNOSIS — K909 Intestinal malabsorption, unspecified: Secondary | ICD-10-CM | POA: Insufficient documentation

## 2022-01-12 DIAGNOSIS — D500 Iron deficiency anemia secondary to blood loss (chronic): Secondary | ICD-10-CM

## 2022-01-12 LAB — CBC WITH DIFFERENTIAL (CANCER CENTER ONLY)
Abs Immature Granulocytes: 0.05 10*3/uL (ref 0.00–0.07)
Basophils Absolute: 0 10*3/uL (ref 0.0–0.1)
Basophils Relative: 1 %
Eosinophils Absolute: 0.1 10*3/uL (ref 0.0–0.5)
Eosinophils Relative: 3 %
HCT: 38.8 % (ref 36.0–46.0)
Hemoglobin: 12.4 g/dL (ref 12.0–15.0)
Immature Granulocytes: 1 %
Lymphocytes Relative: 40 %
Lymphs Abs: 2 10*3/uL (ref 0.7–4.0)
MCH: 27 pg (ref 26.0–34.0)
MCHC: 32 g/dL (ref 30.0–36.0)
MCV: 84.3 fL (ref 80.0–100.0)
Monocytes Absolute: 0.5 10*3/uL (ref 0.1–1.0)
Monocytes Relative: 9 %
Neutro Abs: 2.3 10*3/uL (ref 1.7–7.7)
Neutrophils Relative %: 46 %
Platelet Count: 312 10*3/uL (ref 150–400)
RBC: 4.6 MIL/uL (ref 3.87–5.11)
RDW: 14.3 % (ref 11.5–15.5)
WBC Count: 5 10*3/uL (ref 4.0–10.5)
nRBC: 0 % (ref 0.0–0.2)

## 2022-01-12 LAB — FERRITIN: Ferritin: 19 ng/mL (ref 11–307)

## 2022-01-12 LAB — RETICULOCYTES
Immature Retic Fract: 5.8 % (ref 2.3–15.9)
RBC.: 4.6 MIL/uL (ref 3.87–5.11)
Retic Count, Absolute: 85.6 K/uL (ref 19.0–186.0)
Retic Ct Pct: 1.9 % (ref 0.4–3.1)

## 2022-01-12 NOTE — Progress Notes (Signed)
Hematology and Oncology Follow Up Visit  Michelle Pacheco [id number] Oct 19, 1985 36 y.o. 01/12/2022   Principle Diagnosis:  Iron deficiency anemia secondary to heavy cycles and intermittent GI blood loss/malabsorption with Crohn's    Current Therapy:        IV iron as indicated    Interim History:  Michelle Pacheco is here today for follow-up. She is doing fairly well. She had some issues over the lae summer and fall with oral contraception. She had continuous bleeding as a result for 2 months. She then had a TVUS which showed a uterine polyp. She states that this was biopsied and negative. She states that she discussed having a hysterectomy but has decided to likely have an ablation in January 2024. She is off of birth control.  She is on her cycle right now and states that the first 2 days were heavy flow.  No other blood loss noted. No abnormal bruising, no petechiae.  She has had occasional dizziness with anemia.  No fever, chills, n/v, cough, rash, SOB, chest pain, palpitations, abdominal pain or changes in bowel or bladder habits.  She is on Methotrexate for her Crohn's and has not had a recent flare.  No swelling, tenderness, numbness or tingling in her extremities.  No falls or syncope.  Appetite is good and she is working on better hydrating throughout the day. Her weight is stable at 222 lbs.   ECOG Performance Status: 1 - Symptomatic but completely ambulatory  Medications:  Allergies as of 01/12/2022       Reactions   Bioflavonoids Hives   Latex Hives   Peanuts [peanut Oil] Swelling   Azithromycin Diarrhea   Citrus Hives   Shellfish Allergy         Medication List        Accurate as of January 12, 2022  1:04 PM. If you have any questions, ask your nurse or doctor.          EPINEPHrine 0.3 mg/0.3 mL Soaj injection Commonly known as: EPI-PEN Use as directed as needed for systemic recations   escitalopram 10 MG tablet Commonly known as: LEXAPRO Take 1/2  tablet once a day for 1 week and then take a full tablet once daily.   Fluarix Quadrivalent 0.5 ML injection Generic drug: influenza vac split quadrivalent PF Inject into the muscle.   levonorgestrel-ethinyl estradiol 0.15-0.03 MG tablet Commonly known as: Jolessa Take 1 tablet by mouth once a day/ skip inactive pills 84 days   mesalamine 0.375 g 24 hr capsule Commonly known as: APRISO Take 4 capsules by mouth in the morning once a day   misoprostol 100 MCG tablet Commonly known as: CYTOTEC Insert 1 tablet vaginally as directed 12 hours prior to procedure and repeat 1 tablet 2 hours prior to procedure   polyethylene glycol powder 17 GM/SCOOP powder Commonly known as: GLYCOLAX/MIRALAX See admin instructions.   tranexamic acid 650 MG Tabs tablet Commonly known as: LYSTEDA Take 2 tablets (1,300 mg total) by mouth 3 (three) times daily during menses for 5 days.   tranexamic acid 650 MG Tabs tablet Commonly known as: LYSTEDA Take 1 tablet (650 mg total) by mouth in the morning, at noon, and at bedtime.   Tri-VyLibra 0.18/0.215/0.25 MG-35 MCG tablet Generic drug: Norgestimate-Ethinyl Estradiol Triphasic Take 1 tablet by mouth once daily   triamcinolone ointment 0.1 % Commonly known as: KENALOG Apply 1 application on to the skin 2 times daily or as needed to the body only   ZYRTEC  PO Take 1 tablet by mouth daily at 6 (six) AM.        Allergies:  Allergies  Allergen Reactions   Bioflavonoids Hives   Latex Hives   Peanuts [Peanut Oil] Swelling   Azithromycin Diarrhea   Citrus Hives   Shellfish Allergy     Past Medical History, Surgical history, Social history, and Family History were reviewed and updated.  Review of Systems: All other 10 point review of systems is negative.   Physical Exam:  vitals were not taken for this visit.   Wt Readings from Last 3 Encounters:  09/02/21 220 lb 1.9 oz (99.8 kg)  04/28/21 230 lb 1.9 oz (104.4 kg)  12/29/20 241 lb (109.3  kg)    Ocular: Sclerae unicteric, pupils equal, round and reactive to light Ear-nose-throat: Oropharynx clear, dentition fair Lymphatic: No cervical or supraclavicular adenopathy Lungs no rales or rhonchi, good excursion bilaterally Heart regular rate and rhythm, no murmur appreciated Abd soft, nontender, positive bowel sounds MSK no focal spinal tenderness, no joint edema Neuro: non-focal, well-oriented, appropriate affect Breasts: Deferred  Lab Results  Component Value Date   WBC 5.0 01/12/2022   HGB 12.4 01/12/2022   HCT 38.8 01/12/2022   MCV 84.3 01/12/2022   PLT 312 01/12/2022   Lab Results  Component Value Date   FERRITIN 17 09/02/2021   IRON 41 09/02/2021   TIBC 405 09/02/2021   UIBC 364 09/02/2021   IRONPCTSAT 10 (L) 09/02/2021   Lab Results  Component Value Date   RETICCTPCT 1.9 01/12/2022   RBC 4.60 01/12/2022   No results found for: "KPAFRELGTCHN", "LAMBDASER", "KAPLAMBRATIO" No results found for: "IGGSERUM", "IGA", "IGMSERUM" No results found for: "TOTALPROTELP", "ALBUMINELP", "A1GS", "A2GS", "BETS", "BETA2SER", "GAMS", "MSPIKE", "SPEI"   Chemistry      Component Value Date/Time   NA 138 06/30/2020 0855   NA 140 06/19/2018 0813   K 3.9 06/30/2020 0855   CL 103 06/30/2020 0855   CO2 28 06/30/2020 0855   BUN 14 06/30/2020 0855   BUN 13 06/19/2018 0813   CREATININE 0.88 06/30/2020 0855      Component Value Date/Time   CALCIUM 9.8 06/30/2020 0855   ALKPHOS 64 06/30/2020 0855   AST 20 06/30/2020 0855   ALT 21 06/30/2020 0855   BILITOT 0.3 06/30/2020 0855       Impression and Plan: Michelle Pacheco is a very pleasant 36 yo African American female with history of iron deficiency anemia secondary to heavy cycles and intermittent GI blood loss/malabsorption with Crohn's. Iron studies are pending.  Lab check in 4 months, follow-up in 8 months.   Michelle Stanford, NP 11/29/20231:04 PM

## 2022-01-13 LAB — IRON AND IRON BINDING CAPACITY (CC-WL,HP ONLY)
Iron: 54 ug/dL (ref 28–170)
Saturation Ratios: 13 % (ref 10.4–31.8)
TIBC: 430 ug/dL (ref 250–450)
UIBC: 376 ug/dL (ref 148–442)

## 2022-01-24 ENCOUNTER — Encounter: Payer: Self-pay | Admitting: Family

## 2022-01-27 ENCOUNTER — Encounter: Payer: Self-pay | Admitting: Family

## 2022-01-29 ENCOUNTER — Encounter: Payer: Self-pay | Admitting: Family

## 2022-03-03 ENCOUNTER — Encounter: Payer: Self-pay | Admitting: Family

## 2022-03-10 ENCOUNTER — Encounter: Payer: Self-pay | Admitting: Nurse Practitioner

## 2022-03-10 ENCOUNTER — Ambulatory Visit (INDEPENDENT_AMBULATORY_CARE_PROVIDER_SITE_OTHER): Payer: 59 | Admitting: Nurse Practitioner

## 2022-03-10 VITALS — BP 122/83 | HR 69 | Temp 98.7°F | Ht 64.0 in

## 2022-03-10 DIAGNOSIS — E669 Obesity, unspecified: Secondary | ICD-10-CM | POA: Diagnosis not present

## 2022-03-10 DIAGNOSIS — D500 Iron deficiency anemia secondary to blood loss (chronic): Secondary | ICD-10-CM | POA: Diagnosis not present

## 2022-03-10 DIAGNOSIS — Z6836 Body mass index (BMI) 36.0-36.9, adult: Secondary | ICD-10-CM | POA: Diagnosis not present

## 2022-03-10 DIAGNOSIS — E282 Polycystic ovarian syndrome: Secondary | ICD-10-CM

## 2022-03-10 DIAGNOSIS — R5383 Other fatigue: Secondary | ICD-10-CM | POA: Diagnosis not present

## 2022-03-10 NOTE — Progress Notes (Signed)
Office: [phone]  /  Fax: [phone]   Initial Visit  Michelle Pacheco was seen in clinic today to evaluate for obesity. She is interested in losing weight to improve overall health and reduce the risk of weight related complications. She presents today to review program treatment options, initial physical assessment, and evaluation.     She was referred by: PCP  When asked what else they would like to accomplish? She states: Adopt healthier eating patterns, Improve energy levels and physical activity, Improve quality of life, Improve appearance, Improve self-confidence, and Lose a target amount of weight : >20 lbs.   When asked how has your weight affected you? She states: Has affected self-esteem, Relationships, Contributed to medical problems, Contributed to orthopedic problems or mobility issues, Having fatigue, Having poor endurance, Problems with eating patterns, and Problems with depression and or anxiety  Some associated conditions: PCOS, Vitamin D Deficiency, and Other: iron def anemia, Chronos disease.    Contributing factors: Family history, Medications, Stress, and Pregnancy  Weight promoting medications identified: Contraceptives or hormonal therapy  Current nutrition plan: None  Current level of physical activity: None  Current or previous pharmacotherapy: Phentermine  Response to medication: Had side effects so it was discontinued   Past medical history includes:   Past Medical History:  Diagnosis Date   Anemia    Anxiety    Blood transfusion without reported diagnosis    x2   Medical history non-contributory      Objective:   BP 122/83   Pulse 69   Temp 98.7 F (37.1 C)   Ht 5\' 4"  (1.626 m)   SpO2 100%   BMI 37.78 kg/m  She was weighed on the bioimpedance scale: Body mass index is 37.78 kg/m.  Peak Weight:235 lbs , Body Fat%:41.5, Visceral Fat Rating:10, Weight trend over the last 12 months: Unchanged  General:  Alert, oriented and  cooperative. Patient is in no acute distress.  Respiratory: Normal respiratory effort, no problems with respiration noted  Extremities: Normal range of motion.    Mental Status: Normal mood and affect. Normal behavior. Normal judgment and thought content.   DIAGNOSTIC DATA REVIEWED:  BMET    Component Value Date/Time   NA 138 06/30/2020 0855   NA 140 06/19/2018 0813   K 3.9 06/30/2020 0855   CL 103 06/30/2020 0855   CO2 28 06/30/2020 0855   GLUCOSE 92 06/30/2020 0855   BUN 14 06/30/2020 0855   BUN 13 06/19/2018 0813   CREATININE 0.88 06/30/2020 0855   CALCIUM 9.8 06/30/2020 0855   GFRNONAA >60 06/30/2020 0855   GFRAA 96 06/19/2018 0813   Lab Results  Component Value Date   HGBA1C 5.4 12/28/2018   HGBA1C 5.2 06/21/2018   No results found for: "INSULIN" CBC    Component Value Date/Time   WBC 5.0 01/12/2022 1253   WBC 10.2 10/18/2019 0520   RBC 4.60 01/12/2022 1253   RBC 4.60 01/12/2022 1252   HGB 12.4 01/12/2022 1253   HGB 11.5 12/28/2018 1003   HCT 38.8 01/12/2022 1253   HCT 36.5 12/28/2018 1003   PLT 312 01/12/2022 1253   PLT 339 12/28/2018 1003   MCV 84.3 01/12/2022 1253   MCV 81 12/28/2018 1003   MCH 27.0 01/12/2022 1253   MCHC 32.0 01/12/2022 1253   RDW 14.3 01/12/2022 1253   RDW 13.8 12/28/2018 1003   Iron/TIBC/Ferritin/ %Sat    Component Value Date/Time   IRON 54 01/12/2022 1253   TIBC 430 01/12/2022 1253  FERRITIN 19 01/12/2022 1253   IRONPCTSAT 13 01/12/2022 1253   Lipid Panel     Component Value Date/Time   CHOL 182 06/19/2018 0813   TRIG 77 06/19/2018 0813   HDL 60 06/19/2018 0813   CHOLHDL 3.0 06/19/2018 0813   LDLCALC 107 (H) 06/19/2018 0813   Hepatic Function Panel     Component Value Date/Time   PROT 6.8 06/30/2020 0855   PROT 7.4 06/19/2018 0813   ALBUMIN 4.3 06/30/2020 0855   ALBUMIN 4.5 06/19/2018 0813   AST 20 06/30/2020 0855   ALT 21 06/30/2020 0855   ALKPHOS 64 06/30/2020 0855   BILITOT 0.3 06/30/2020 0855       Component Value Date/Time   TSH 1.900 06/19/2018 0813     Assessment and Plan:  PCOS (polycystic ovarian syndrome) Continue to follow up with GYN  Iron deficiency anemia due to chronic blood loss Continue to follow up with hematology  Other fatigue - Plan: EKG 12-Lead Will obtain labs and EKG at next visit  Obesity: starting BMI 36.6  BMI 36.0-36.9,adult    Obesity Treatment / Action Plan:  Patient will work on garnering support from family and friends to begin weight loss journey. Will work on eliminating or reducing the presence of highly palatable, calorie dense foods in the home. Will complete provided nutritional and psychosocial assessment questionnaire before the next appointment. Will be scheduled for indirect calorimetry to determine resting energy expenditure in a fasting state.  This will allow [place] to create a reduced calorie, high-protein meal plan to promote loss of fat mass while preserving muscle mass.  Obesity Education Performed Today:  She was weighed on the bioimpedance scale and results were discussed and documented in the synopsis.  We discussed obesity as a disease and the importance of a more detailed evaluation of all the factors contributing to the disease.  We discussed the importance of long term lifestyle changes which include nutrition, exercise and behavioral modifications as well as the importance of customizing this to her specific health and social needs.  We discussed the benefits of reaching a healthier weight to alleviate the symptoms of existing conditions and reduce the risks of the biomechanical, metabolic and psychological effects of obesity.  Oswaldo Done appears to be in the action stage of change and states they are ready to start intensive lifestyle modifications and behavioral modifications.  40 minutes was spent today on this visit including the above counseling, pre-visit chart review, and post-visit  documentation.  Reviewed by clinician on day of visit: allergies, medications, problem list, medical history, surgical history, family history, social history, and previous encounter notes.    I have reviewed the above documentation for accuracy and completeness, and I agree with the above. Everardo Pacific, FNP

## 2022-03-17 ENCOUNTER — Other Ambulatory Visit (HOSPITAL_BASED_OUTPATIENT_CLINIC_OR_DEPARTMENT_OTHER): Payer: Self-pay

## 2022-03-17 DIAGNOSIS — N921 Excessive and frequent menstruation with irregular cycle: Secondary | ICD-10-CM | POA: Diagnosis not present

## 2022-03-17 DIAGNOSIS — R9389 Abnormal findings on diagnostic imaging of other specified body structures: Secondary | ICD-10-CM | POA: Diagnosis not present

## 2022-03-17 DIAGNOSIS — D259 Leiomyoma of uterus, unspecified: Secondary | ICD-10-CM | POA: Diagnosis not present

## 2022-03-17 MED ORDER — MYFEMBREE 40-1-0.5 MG PO TABS
1.0000 | ORAL_TABLET | Freq: Every day | ORAL | 3 refills | Status: DC
  Filled 2022-03-17: qty 28, 28d supply, fill #0

## 2022-04-04 ENCOUNTER — Other Ambulatory Visit (HOSPITAL_BASED_OUTPATIENT_CLINIC_OR_DEPARTMENT_OTHER): Payer: Self-pay

## 2022-04-04 ENCOUNTER — Encounter: Payer: Self-pay | Admitting: Family

## 2022-04-04 MED ORDER — MESALAMINE ER 0.375 G PO CP24
1.5000 g | ORAL_CAPSULE | Freq: Every morning | ORAL | 3 refills | Status: AC
  Filled 2022-04-04 – 2023-03-16 (×2): qty 360, 90d supply, fill #0

## 2022-04-04 MED ORDER — MESALAMINE ER 0.375 G PO CP24
1.5000 g | ORAL_CAPSULE | Freq: Every morning | ORAL | 3 refills | Status: DC
  Filled 2022-04-04 – 2022-04-05 (×4): qty 360, 90d supply, fill #0
  Filled 2022-07-01: qty 360, 90d supply, fill #1
  Filled 2022-10-03: qty 360, 90d supply, fill #2

## 2022-04-05 ENCOUNTER — Encounter: Payer: Self-pay | Admitting: Family

## 2022-04-05 ENCOUNTER — Other Ambulatory Visit (HOSPITAL_BASED_OUTPATIENT_CLINIC_OR_DEPARTMENT_OTHER): Payer: Self-pay

## 2022-04-05 ENCOUNTER — Other Ambulatory Visit: Payer: Self-pay

## 2022-04-06 ENCOUNTER — Other Ambulatory Visit: Payer: Self-pay

## 2022-05-11 ENCOUNTER — Inpatient Hospital Stay: Payer: 59 | Attending: Hematology & Oncology

## 2022-05-11 DIAGNOSIS — D500 Iron deficiency anemia secondary to blood loss (chronic): Secondary | ICD-10-CM | POA: Insufficient documentation

## 2022-05-11 DIAGNOSIS — N920 Excessive and frequent menstruation with regular cycle: Secondary | ICD-10-CM | POA: Diagnosis not present

## 2022-05-11 LAB — CBC WITH DIFFERENTIAL (CANCER CENTER ONLY)
Abs Immature Granulocytes: 0.03 10*3/uL (ref 0.00–0.07)
Basophils Absolute: 0 10*3/uL (ref 0.0–0.1)
Basophils Relative: 1 %
Eosinophils Absolute: 0.1 10*3/uL (ref 0.0–0.5)
Eosinophils Relative: 2 %
HCT: 36.5 % (ref 36.0–46.0)
Hemoglobin: 11.6 g/dL — ABNORMAL LOW (ref 12.0–15.0)
Immature Granulocytes: 1 %
Lymphocytes Relative: 33 %
Lymphs Abs: 1.4 10*3/uL (ref 0.7–4.0)
MCH: 25.8 pg — ABNORMAL LOW (ref 26.0–34.0)
MCHC: 31.8 g/dL (ref 30.0–36.0)
MCV: 81.3 fL (ref 80.0–100.0)
Monocytes Absolute: 0.3 10*3/uL (ref 0.1–1.0)
Monocytes Relative: 8 %
Neutro Abs: 2.4 10*3/uL (ref 1.7–7.7)
Neutrophils Relative %: 55 %
Platelet Count: 336 10*3/uL (ref 150–400)
RBC: 4.49 MIL/uL (ref 3.87–5.11)
RDW: 14.2 % (ref 11.5–15.5)
WBC Count: 4.3 10*3/uL (ref 4.0–10.5)
nRBC: 0 % (ref 0.0–0.2)

## 2022-05-11 LAB — RETICULOCYTES
Immature Retic Fract: 9.6 % (ref 2.3–15.9)
RBC.: 4.36 MIL/uL (ref 3.87–5.11)
Retic Count, Absolute: 70.6 10*3/uL (ref 19.0–186.0)
Retic Ct Pct: 1.6 % (ref 0.4–3.1)

## 2022-05-11 LAB — IRON AND IRON BINDING CAPACITY (CC-WL,HP ONLY)
Iron: 38 ug/dL (ref 28–170)
Saturation Ratios: 9 % — ABNORMAL LOW (ref 10.4–31.8)
TIBC: 419 ug/dL (ref 250–450)
UIBC: 381 ug/dL (ref 148–442)

## 2022-05-11 LAB — FERRITIN: Ferritin: 6 ng/mL — ABNORMAL LOW (ref 11–307)

## 2022-05-25 ENCOUNTER — Inpatient Hospital Stay: Payer: 59

## 2022-05-26 ENCOUNTER — Inpatient Hospital Stay: Payer: 59 | Attending: Hematology & Oncology

## 2022-05-26 VITALS — BP 110/71 | HR 73 | Temp 98.2°F | Resp 18

## 2022-05-26 DIAGNOSIS — K909 Intestinal malabsorption, unspecified: Secondary | ICD-10-CM | POA: Insufficient documentation

## 2022-05-26 DIAGNOSIS — K922 Gastrointestinal hemorrhage, unspecified: Secondary | ICD-10-CM | POA: Insufficient documentation

## 2022-05-26 DIAGNOSIS — N920 Excessive and frequent menstruation with regular cycle: Secondary | ICD-10-CM | POA: Diagnosis not present

## 2022-05-26 DIAGNOSIS — D500 Iron deficiency anemia secondary to blood loss (chronic): Secondary | ICD-10-CM | POA: Diagnosis not present

## 2022-05-26 MED ORDER — SODIUM CHLORIDE 0.9 % IV SOLN: Freq: Once | INTRAVENOUS | Status: AC

## 2022-05-26 MED ORDER — SODIUM CHLORIDE 0.9 % IV SOLN
125.0000 mg | Freq: Once | INTRAVENOUS | Status: AC
  Administered 2022-05-26: 125 mg via INTRAVENOUS
  Filled 2022-05-26: qty 10

## 2022-05-26 NOTE — Patient Instructions (Signed)
Sodium Ferric Gluconate Complex Injection What is this medication? SODIUM FERRIC GLUCONATE COMPLEX (SOE dee um FER ik GLOO koe nate KOM pleks) treats low levels of iron (iron deficiency anemia) in people with kidney disease. Iron is a mineral that plays an important role in making red blood cells, which carry oxygen from your lungs to the rest of your body. This medicine may be used for other purposes; ask your health care provider or pharmacist if you have questions. COMMON BRAND NAME(S): Ferrlecit, Nulecit What should I tell my care team before I take this medication? They need to know if you have any of the following conditions: Anemia that is not from iron deficiency High levels of iron in the blood An unusual or allergic reaction to iron, other medications, foods, dyes, or preservatives Pregnant or are trying to become pregnant Breast-feeding How should I use this medication? This medication is injected into a vein. It is given by your care team in a hospital or clinic setting. Talk to your care team about the use of this medication in children. While it may be prescribed for children as young as 6 years for selected conditions, precautions do apply. Overdosage: If you think you have taken too much of this medicine contact a poison control center or emergency room at once. NOTE: This medicine is only for you. Do not share this medicine with others. What if I miss a dose? It is important not to miss your dose. Call your care team if you are unable to keep an appointment. What may interact with this medication? Do not take this medication with any of the following: Deferasirox Deferoxamine Dimercaprol This medication may also interact with the following: Other iron products This list may not describe all possible interactions. Give your health care provider a list of all the medicines, herbs, non-prescription drugs, or dietary supplements you use. Also tell them if you smoke, drink  alcohol, or use illegal drugs. Some items may interact with your medicine. What should I watch for while using this medication? Your condition will be monitored carefully while you are receiving this medication. Visit your care team for regular checks on your progress. You may need blood work while you are taking this medication. What side effects may I notice from receiving this medication? Side effects that you should report to your care team as soon as possible: Allergic reactions--skin rash, itching, hives, swelling of the face, lips, tongue, or throat Low blood pressure--dizziness, feeling faint or lightheaded, blurry vision Shortness of breath Side effects that usually do not require medical attention (report to your care team if they continue or are bothersome): Flushing Headache Joint pain Muscle pain Nausea Pain, redness, or irritation at injection site This list may not describe all possible side effects. Call your doctor for medical advice about side effects. You may report side effects to FDA at 1-800-FDA-1088. Where should I keep my medication? This medication is given in a hospital or clinic and will not be stored at home. NOTE: This sheet is a summary. It may not cover all possible information. If you have questions about this medicine, talk to your doctor, pharmacist, or health care provider.  2023 Elsevier/Gold Standard (2020-06-26 00:00:00)  

## 2022-06-01 ENCOUNTER — Inpatient Hospital Stay: Payer: 59

## 2022-06-02 ENCOUNTER — Inpatient Hospital Stay: Payer: 59

## 2022-06-02 VITALS — BP 109/77 | HR 73 | Temp 97.8°F | Resp 18

## 2022-06-02 DIAGNOSIS — D500 Iron deficiency anemia secondary to blood loss (chronic): Secondary | ICD-10-CM | POA: Diagnosis not present

## 2022-06-02 DIAGNOSIS — K909 Intestinal malabsorption, unspecified: Secondary | ICD-10-CM | POA: Diagnosis not present

## 2022-06-02 DIAGNOSIS — K922 Gastrointestinal hemorrhage, unspecified: Secondary | ICD-10-CM | POA: Diagnosis not present

## 2022-06-02 DIAGNOSIS — N920 Excessive and frequent menstruation with regular cycle: Secondary | ICD-10-CM | POA: Diagnosis not present

## 2022-06-02 MED ORDER — SODIUM CHLORIDE 0.9 % IV SOLN
125.0000 mg | Freq: Once | INTRAVENOUS | Status: AC
  Administered 2022-06-02: 125 mg via INTRAVENOUS
  Filled 2022-06-02: qty 125

## 2022-06-02 MED ORDER — SODIUM CHLORIDE 0.9 % IV SOLN: Freq: Once | INTRAVENOUS | Status: AC

## 2022-06-02 NOTE — Patient Instructions (Signed)
Sodium Ferric Gluconate Complex Injection What is this medication? SODIUM FERRIC GLUCONATE COMPLEX (SOE dee um FER ik GLOO koe nate KOM pleks) treats low levels of iron (iron deficiency anemia) in people with kidney disease. Iron is a mineral that plays an important role in making red blood cells, which carry oxygen from your lungs to the rest of your body. This medicine may be used for other purposes; ask your health care provider or pharmacist if you have questions. COMMON BRAND NAME(S): Ferrlecit, Nulecit What should I tell my care team before I take this medication? They need to know if you have any of the following conditions: Anemia that is not from iron deficiency High levels of iron in the blood An unusual or allergic reaction to iron, other medications, foods, dyes, or preservatives Pregnant or are trying to become pregnant Breast-feeding How should I use this medication? This medication is injected into a vein. It is given by your care team in a hospital or clinic setting. Talk to your care team about the use of this medication in children. While it may be prescribed for children as young as 6 years for selected conditions, precautions do apply. Overdosage: If you think you have taken too much of this medicine contact a poison control center or emergency room at once. NOTE: This medicine is only for you. Do not share this medicine with others. What if I miss a dose? It is important not to miss your dose. Call your care team if you are unable to keep an appointment. What may interact with this medication? Do not take this medication with any of the following: Deferasirox Deferoxamine Dimercaprol This medication may also interact with the following: Other iron products This list may not describe all possible interactions. Give your health care provider a list of all the medicines, herbs, non-prescription drugs, or dietary supplements you use. Also tell them if you smoke, drink  alcohol, or use illegal drugs. Some items may interact with your medicine. What should I watch for while using this medication? Your condition will be monitored carefully while you are receiving this medication. Visit your care team for regular checks on your progress. You may need blood work while you are taking this medication. What side effects may I notice from receiving this medication? Side effects that you should report to your care team as soon as possible: Allergic reactions--skin rash, itching, hives, swelling of the face, lips, tongue, or throat Low blood pressure--dizziness, feeling faint or lightheaded, blurry vision Shortness of breath Side effects that usually do not require medical attention (report to your care team if they continue or are bothersome): Flushing Headache Joint pain Muscle pain Nausea Pain, redness, or irritation at injection site This list may not describe all possible side effects. Call your doctor for medical advice about side effects. You may report side effects to FDA at 1-800-FDA-1088. Where should I keep my medication? This medication is given in a hospital or clinic and will not be stored at home. NOTE: This sheet is a summary. It may not cover all possible information. If you have questions about this medicine, talk to your doctor, pharmacist, or health care provider.  2023 Elsevier/Gold Standard (2020-06-26 00:00:00)  

## 2022-06-15 DIAGNOSIS — R9389 Abnormal findings on diagnostic imaging of other specified body structures: Secondary | ICD-10-CM | POA: Diagnosis not present

## 2022-06-15 DIAGNOSIS — D509 Iron deficiency anemia, unspecified: Secondary | ICD-10-CM | POA: Diagnosis not present

## 2022-06-15 DIAGNOSIS — D259 Leiomyoma of uterus, unspecified: Secondary | ICD-10-CM | POA: Diagnosis not present

## 2022-06-15 DIAGNOSIS — N921 Excessive and frequent menstruation with irregular cycle: Secondary | ICD-10-CM | POA: Diagnosis not present

## 2022-06-22 DIAGNOSIS — D3132 Benign neoplasm of left choroid: Secondary | ICD-10-CM | POA: Diagnosis not present

## 2022-06-22 DIAGNOSIS — H52223 Regular astigmatism, bilateral: Secondary | ICD-10-CM | POA: Diagnosis not present

## 2022-06-30 ENCOUNTER — Other Ambulatory Visit (HOSPITAL_BASED_OUTPATIENT_CLINIC_OR_DEPARTMENT_OTHER): Payer: Self-pay

## 2022-06-30 DIAGNOSIS — Z91018 Allergy to other foods: Secondary | ICD-10-CM | POA: Diagnosis not present

## 2022-06-30 DIAGNOSIS — Z91013 Allergy to seafood: Secondary | ICD-10-CM | POA: Diagnosis not present

## 2022-06-30 DIAGNOSIS — R21 Rash and other nonspecific skin eruption: Secondary | ICD-10-CM | POA: Diagnosis not present

## 2022-06-30 DIAGNOSIS — J301 Allergic rhinitis due to pollen: Secondary | ICD-10-CM | POA: Diagnosis not present

## 2022-06-30 MED ORDER — OLOPATADINE HCL 0.2 % OP SOLN
1.0000 [drp] | Freq: Every day | OPHTHALMIC | 5 refills | Status: DC | PRN
  Filled 2022-06-30: qty 2.5, 50d supply, fill #0

## 2022-06-30 MED ORDER — TRIAMCINOLONE ACETONIDE 0.1 % EX OINT
1.0000 | TOPICAL_OINTMENT | Freq: Two times a day (BID) | CUTANEOUS | 5 refills | Status: AC | PRN
  Filled 2022-06-30: qty 60, 30d supply, fill #0

## 2022-06-30 MED ORDER — EPINEPHRINE 0.3 MG/0.3ML IJ SOAJ
0.3000 mg | Freq: Once | INTRAMUSCULAR | 1 refills | Status: AC | PRN
  Filled 2022-06-30: qty 2, 2d supply, fill #0

## 2022-06-30 MED ORDER — CETIRIZINE HCL 10 MG PO TABS
10.0000 mg | ORAL_TABLET | Freq: Two times a day (BID) | ORAL | 5 refills | Status: AC
  Filled 2022-06-30: qty 50, 25d supply, fill #0
  Filled 2022-06-30: qty 60, 30d supply, fill #0
  Filled 2022-06-30: qty 10, 5d supply, fill #0

## 2022-07-05 ENCOUNTER — Other Ambulatory Visit (HOSPITAL_BASED_OUTPATIENT_CLINIC_OR_DEPARTMENT_OTHER): Payer: Self-pay

## 2022-08-04 ENCOUNTER — Other Ambulatory Visit: Payer: Self-pay | Admitting: Obstetrics and Gynecology

## 2022-08-04 ENCOUNTER — Other Ambulatory Visit (HOSPITAL_COMMUNITY)
Admission: RE | Admit: 2022-08-04 | Discharge: 2022-08-04 | Disposition: A | Discharge status: Home / Self Care | Payer: 59 | Source: Ambulatory Visit | Attending: Obstetrics and Gynecology | Admitting: Obstetrics and Gynecology

## 2022-08-04 DIAGNOSIS — Z01419 Encounter for gynecological examination (general) (routine) without abnormal findings: Secondary | ICD-10-CM | POA: Insufficient documentation

## 2022-08-04 DIAGNOSIS — R350 Frequency of micturition: Secondary | ICD-10-CM | POA: Diagnosis not present

## 2022-08-05 DIAGNOSIS — W19XXXA Unspecified fall, initial encounter: Secondary | ICD-10-CM

## 2022-08-05 HISTORY — DX: Unspecified fall, initial encounter: W19.XXXA

## 2022-08-08 ENCOUNTER — Other Ambulatory Visit: Payer: Self-pay

## 2022-08-08 ENCOUNTER — Encounter (HOSPITAL_BASED_OUTPATIENT_CLINIC_OR_DEPARTMENT_OTHER): Payer: Self-pay | Admitting: Obstetrics and Gynecology

## 2022-08-08 LAB — CYTOLOGY - PAP
Comment: NEGATIVE
Diagnosis: NEGATIVE
High risk HPV: NEGATIVE

## 2022-08-08 NOTE — Progress Notes (Addendum)
Spoke w/ via phone for pre-op interview---pt Lab needs dos----urine preg  , surgery orders requested 07-31-2022 dr Richardson Dopp epic ib             Lab results------none COVID test -----patient states asymptomatic no test needed Arrive at -------1200 pm  NPO after MN NO Solid Food.  Clear liquids from MN until---1100 Med rec completed Medications to take morning of surgery -----certrizine, mesalamine Diabetic medication -----n/a Patient instructed no nail polish to be worn day of surgery Patient instructed to bring photo id and insurance card day of surgery Patient aware to have Driver (ride ) / caregiver   husband ruben  for 24 hours after surgery  Patient Special Instructions -----none Pre-Op special Instructions -----none Patient verbalized understanding of instructions that were given at this phone interview. Patient denies shortness of breath, chest pain, fever, cough at this phone interview.

## 2022-08-12 DIAGNOSIS — K50111 Crohn's disease of large intestine with rectal bleeding: Secondary | ICD-10-CM | POA: Diagnosis not present

## 2022-08-16 DIAGNOSIS — D259 Leiomyoma of uterus, unspecified: Secondary | ICD-10-CM | POA: Diagnosis not present

## 2022-08-16 DIAGNOSIS — R9389 Abnormal findings on diagnostic imaging of other specified body structures: Secondary | ICD-10-CM | POA: Diagnosis not present

## 2022-08-16 DIAGNOSIS — D509 Iron deficiency anemia, unspecified: Secondary | ICD-10-CM | POA: Diagnosis not present

## 2022-08-16 DIAGNOSIS — N898 Other specified noninflammatory disorders of vagina: Secondary | ICD-10-CM | POA: Diagnosis not present

## 2022-08-16 DIAGNOSIS — N921 Excessive and frequent menstruation with irregular cycle: Secondary | ICD-10-CM | POA: Diagnosis not present

## 2022-08-23 ENCOUNTER — Encounter: Payer: Self-pay | Admitting: Family

## 2022-08-24 ENCOUNTER — Other Ambulatory Visit: Payer: 59

## 2022-08-24 ENCOUNTER — Inpatient Hospital Stay: Payer: 59 | Attending: Hematology & Oncology

## 2022-08-24 DIAGNOSIS — K909 Intestinal malabsorption, unspecified: Secondary | ICD-10-CM | POA: Insufficient documentation

## 2022-08-24 DIAGNOSIS — N920 Excessive and frequent menstruation with regular cycle: Secondary | ICD-10-CM | POA: Insufficient documentation

## 2022-08-24 DIAGNOSIS — D500 Iron deficiency anemia secondary to blood loss (chronic): Secondary | ICD-10-CM | POA: Insufficient documentation

## 2022-08-24 LAB — CBC WITH DIFFERENTIAL (CANCER CENTER ONLY)
Abs Immature Granulocytes: 0.05 10*3/uL (ref 0.00–0.07)
Basophils Absolute: 0 10*3/uL (ref 0.0–0.1)
Basophils Relative: 0 %
Eosinophils Absolute: 0.1 10*3/uL (ref 0.0–0.5)
Eosinophils Relative: 2 %
HCT: 38.4 % (ref 36.0–46.0)
Hemoglobin: 12.1 g/dL (ref 12.0–15.0)
Immature Granulocytes: 1 %
Lymphocytes Relative: 34 %
Lymphs Abs: 1.8 10*3/uL (ref 0.7–4.0)
MCH: 25.9 pg — ABNORMAL LOW (ref 26.0–34.0)
MCHC: 31.5 g/dL (ref 30.0–36.0)
MCV: 82.2 fL (ref 80.0–100.0)
Monocytes Absolute: 0.5 10*3/uL (ref 0.1–1.0)
Monocytes Relative: 9 %
Neutro Abs: 2.9 10*3/uL (ref 1.7–7.7)
Neutrophils Relative %: 54 %
Platelet Count: 288 10*3/uL (ref 150–400)
RBC: 4.67 MIL/uL (ref 3.87–5.11)
RDW: 15.2 % (ref 11.5–15.5)
WBC Count: 5.3 10*3/uL (ref 4.0–10.5)
nRBC: 0 % (ref 0.0–0.2)

## 2022-08-24 LAB — RETICULOCYTES
Immature Retic Fract: 11.1 % (ref 2.3–15.9)
RBC.: 4.66 MIL/uL (ref 3.87–5.11)
Retic Count, Absolute: 79.2 10*3/uL (ref 19.0–186.0)
Retic Ct Pct: 1.7 % (ref 0.4–3.1)

## 2022-08-24 LAB — IRON AND IRON BINDING CAPACITY (CC-WL,HP ONLY)
Iron: 74 ug/dL (ref 28–170)
Saturation Ratios: 17 % (ref 10.4–31.8)
TIBC: 435 ug/dL (ref 250–450)
UIBC: 361 ug/dL (ref 148–442)

## 2022-08-24 LAB — FERRITIN: Ferritin: 12 ng/mL (ref 11–307)

## 2022-08-30 ENCOUNTER — Other Ambulatory Visit: Payer: Self-pay | Admitting: Obstetrics and Gynecology

## 2022-08-30 DIAGNOSIS — N921 Excessive and frequent menstruation with irregular cycle: Secondary | ICD-10-CM

## 2022-08-30 NOTE — H&P (Deleted)
Subjective:    Chief Complaint(s): Preop/ Heavy menses    HPI:      General 37 y/o presents for preop visit. Pt is schedule for a  hysteroscopy, dilation and currettage and hydrothermal ablation on 08/31/2022 for the management of menorrhagia, thickened endometrium, and iron deficiency anemia.  --- IN REVIEW:  Visit on 06/15/2022, patient reported trying the [facility] for 1 month. she had some night sweats at first. she reports still having heavy menses. she went into the following  month and was not taking the myfembree consistently. she had irregular bleeding. she has required iron infusion since her last visit. she reports being tired of bleeding.  --- Visit on 03/17/2022, reported menses are still heavy even with the lysteda. she gets dizzy with the lyseda and is unable to take 3 doses a day. she changes a pad every 1 1/2 hours with the lysteda . SHe wears a thick overnight pad ( 2 pads) . Pt was given Myfembree sample. --- Patient states that she had bleeding from August until October 7. she was saturating 2 overnight pads in an hour on her heaviest day. she reports feeling very tired. she had an iron infusion July 26th. Her Hgb on 11/18/2021 was 12.5. Visit on 11/09/2021, pt reported is bleeding heavy with clots. States that she has bleed continuously since August with only stopping bleeding maybe a day or so. Concerns that OCP has made her bleeding worse. Has taken 2 pills OCP on some extremely heavy flow days. Pt would like to stop OCP to monitor how bleeding will be affected. GYN ULTRASOUND FINDINGS ON 11/09/2021: anteverted uterus Uterus inhomogeneous in appearance-? uterine fibroid-post submucosal 2.0x1.8x1.4cm Endometrium thickened and irregular in appearance. Endo 1.02cm bilateral OV's enl No adnexal mases seen EMB performed on 11/09/2021 was negative. On 08/15 /2023 she was switched from orthotricyclene to [place]. Her bleeding got worse on the jolessa. Pt was started on Lysteda on  11/23/2021.      Current Medication:     Taking Mesalamine ER 0.375 GM Capsule Extended Release 24 Hour 4 capsules in the morning Orally Once a day. MiraLax 17 GM/SCOOP Powder as directed Orally. ZyrTEC Allergy 10 MG Tablet 1 tablet Orally Once a day. Medication List reviewed and reconciled with the patient.    Medical History: iron deficiency Anemia s/p iron infusion x2 followed by hematology PCOS Umbilical Hernia Hemorrhoids Chronic Constipation Crohn's Colitis (Dx 05/07/20 w/ Colonoscopy - Dr. Ewing Schlein / Celso Amy, PA-C) seasonal allergies-followed by [facility]    Allergies/Intolerance: Latex: Allergy - rash Peanut-containing Drug Products: Allergy - anything peanut tree: Allergy  Unstructured/Uncoded allergies will not be considered for drug-allergy warnings. Please remove and re-enter them in structured/coded format. grass pollen: Allergy  Unstructured/Uncoded allergies will not be considered for drug-allergy warnings. Please remove and re-enter them in structured/coded format. Tree Nuts: Allergy - hives    Gyn History: Sexual activity currently sexually active.  Periods : Every 21-28 days.  LMP 07/10/2022.  Denies Birth control.  Last pap smear date 03/27/2019-negative.  Denies Last mammogram date.  Denies Abnormal pap smear.  H/O STD Chlamydia, Gonorrhea at early age.  Menarche 10.  GYN procedures D/C 2003.     OB History: Number of pregnancies  6.  miscarriages  1.  abortion  1.  Pregnancy # 1  abortion.  Pregnancy # 2  miscarriage.  Pregnancy # 3  live birth, vaginal delivery, boy.  Pregnancy # 4:  live birth, vaginal delivery, girl 04/17/2012, [facility]  Pregnancy #  5:  live birth, vaginal delivery, boy.  Pregnancy # 6  live birth, vaginal delivery.     Surgical History: D & C 2003 colonoscopy 04/2020 umblica hernia repair 11/18/20    Hospitalization: childbirth x4, had to have blood transfusion after childbirth 2010, 2014, 2019, 2021    Family  History: Father: deceased, Diabetes, diagnosed with Diabetes Mother: alive, MS Paternal Grand Father: deceased, diagnosed with Diabetes Paternal Grand Mother: deceased Maternal Grand Father: deceased, diagnosed with Hypertension, Prostate CA Maternal Grand Mother: deceased Brother 1: alive, Crohn's Disease 2 son(s) , 2 daughter(s) - healthy. No Family History of Colon Cancer, Polyps, or Liver Disease. Mother with MS.    Social History:      General Tobacco use:   cigarettes:  Former smoker, Quit in year  2013, Tobacco history last updated  08/16/2022, Vaping  No.  EXPOSURE TO PASSIVE SMOKE:  socially. Alcohol:  yes 1 drink or less. Caffeine:  yes coffee, soda, tea, occasionally. Recreational drug use:  no no. DIET:  no particular dietary program. Exercise:  no. Marital Status:  married. Children:  2 boys, 2 girls. 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    ROS:      CONSTITUTIONAL Chills  No.  Fatigue  No.  Fever  No.  Night sweats  No.  Recent travel outside [place]  No.  Sweats  No.  Weight change  No.       OPHTHALMOLOGY Blurring of vision  no.  Change in vision  no.  Double vision  no.       ENT Dizziness  no.  Nose bleeds  no.  Sore throat  no.  Teeth pain  no.       ALLERGY Hives  no.       CARDIOLOGY Chest pain  no.  High blood pressure  no.  Irregular heart beat  no.  Leg edema  no.  Palpitations  no.       RESPIRATORY Shortness of breath  no.  Cough  no.  Wheezing  no.       UROLOGY Pain with urination  no.  Urinary urgency  no.  Urinary frequency  no.  Urinary incontinence  no.  Difficulty urinating  No.  Blood in urine  No.       GASTROENTEROLOGY Abdominal pain  no.  Appetite change  no.  Bloating/belching  no.  Blood in stool or on toilet paper  no.  Change in bowel movements  no.  Constipation  no.  Diarrhea  no.  Difficulty swallowing  no.  Nausea  no.       FEMALE REPRODUCTIVE Vulvar pain  no.  Vulvar rash  no.  Abnormal vaginal  bleeding  , heavy menses.  Breast pain  no.  Nipple discharge  no.  Pain with intercourse  no.  Pelvic pain  no.  Unusual vaginal discharge  no.  Vaginal itching  no.       MUSCULOSKELETAL Muscle aches  no.       NEUROLOGY Headache  no.  Tingling/numbness  no.  Weakness  no.       PSYCHOLOGY Depression  no.  Anxiety  no.  Nervousness  no.  Sleep disturbances  no.  Suicidal ideation  no .       ENDOCRINOLOGY Excessive thirst  no.  Excessive urination  no.  Hair loss  no.  Heat or cold intolerance  no.       HEMATOLOGY/LYMPH Abnormal bleeding  no.  Easy bruising  no.  Swollen glands  no.       DERMATOLOGY New/changing skin lesion  no.  Rash  no.  Sores  no.  Negative except as stated in HPI.   Objective:    Vitals: Wt: 220.8, Wt change: 3.8 lbs, Ht: 64, BMI: 37.9, Pulse sitting: 80, BP sitting: 128/79.    Past Results:    Examination:      General Examination CONSTITUTIONAL: alert, oriented, NAD.  SKIN:  moist, warm.  EYES:  Conjunctiva clear.  LUNGS: good I:E efffort noted, clear to auscultation bilaterally.  HEART: regular rate and rhythm.  ABDOMEN: soft, non-tender/non-distended, bowel sounds present.  FEMALE GENITOURINARY: normal external genitalia, labia - unremarkable, vagina - pink moist mucosa, no lesions or abnormal discharge, cervix - no discharge or lesions or CMT, adnexa - no masses or tenderness, uterus - nontender and normal size on palpation.  EXTREMITIES: no edema present.  PSYCH:  affect normal, good eye contact.     Physical Examination:      Chaperone present Chaperone present Guest1, eCW Morene Rankins 08/16/2022 02:19:48 PM >, for pelvic exam.    Assessment:    Assessment: Menorrhagia with irregular cycle - N92.1 (Primary) Fibroid uterus - D25.9 Thickened endometrium - R93.89 Iron deficiency anemia - D50.9 Vaginal discharge - N89.8   Plan:    Treatment:     Menorrhagia with irregular cycle  Lab:CBC without Diff (Collection Date & Time - 08/16/2022  02:30 PM)      Notes: Pt is scheduled for hysteroscopy d/c with hydrothermal ablation for management of/secondary to menorrhagia with an irregular cycle. Pt is advised she will be able to return home the same day. Discussed risks of hysteroscopy including but not limited to infection, bleeding, possible perforation of the uterus, with the need for further surgery. Pt advised to avoid NSAIDs (Aspirin, Aleve, Advil, Ibuprofen, Motrin) from now until surgery given risk of bleeding during surgery. She may take Tylenol for pain management. She is advised to avoid eating or drinking starting midnight prior to surgery. Pt is advised that she may have watery discharge or cramping after surgery. Discussed post-surgery avoidance of driving for 24 hours or intercourse for 2 weeks after procedure.     Fibroid uterus     Notes: patient declined definitive therapy with hysterectomy she prefers HTA for menorhagia.     Thickened endometrium     Notes: Pt is scheduled for hysteroscopy d/c with hydrothermal ablation for management of/secondary to menorrhagia with an irregular cycle. Pt is advised she will be able to return home the same day. Discussed risks of hysteroscopy including but not limited to infection, bleeding, possible perforation of the uterus, with the need for further surgery. Pt advised to avoid NSAIDs (Aspirin, Aleve, Advil, Ibuprofen, Motrin) from now until surgery given risk of bleeding during surgery. She may take Tylenol for pain management. She is advised to avoid eating or drinking starting midnight prior to surgery. Pt is advised that she may have watery discharge or cramping after surgery. Discussed post-surgery avoidance of driving for 24 hours or intercourse for 2 weeks after procedure.     Iron deficiency anemia     Notes: Pt is scheduled for hysteroscopy d/c with hydrothermal ablation for management of/secondary to menorrhagia with an irregular cycle. Pt is advised she will be able to return  home the same day. Discussed risks of hysteroscopy including but not limited to infection, bleeding, possible perforation of the uterus, with the need for further  surgery. Pt advised to avoid NSAIDs (Aspirin, Aleve, Advil, Ibuprofen, Motrin) from now until surgery given risk of bleeding during surgery. She may take Tylenol for pain management. She is advised to avoid eating or drinking starting midnight prior to surgery. Pt is advised that she may have watery discharge or cramping after surgery. Discussed post-surgery avoidance of driving for 24 hours or intercourse for 2 weeks after procedure.     Vaginal discharge  Lab:[facility] (Collection Date & Time - 08/16/2022 02:30 PM)     Procedures:    Immunizations:    Therapeutic Injections:    Diagnostic Imaging:    Lab Reports:  Lab:[facility] (Collection Date & Time - 08/16/2022 02:30 PM)  Normal                   Value Reference Range                  WBCS - Normal  Normal -                  CLUE CELLS - None seen  None seen -                  TRICHOMONAS - None Seen  None Seen -                  YEAST - None seen  None seen -   Notes: Bernise Sylvain 08/17/2022 02:01:05 PM > no evideence of infection  Lab:CBC without Diff (Collection Date & Time - 08/16/2022 02:30 PM)  Low                   Value Reference Range                  WBC - 5.2  4.0-11.0 - K/ul                  RBC - 4.18 L 4.20-5.40 - M/uL                  HGB - 11.0 L 12.0-16.0 - g/dL                  HCT - 34.0 L 37.0-47.0 - %                  MCV - 81.4  81.0-99.0 - fL                  MCH - 26.2 L 27.0-33.0 - pg                  MCHC - 32.3  32.0-36.0 - g/dL                  RDW - 16.0 H 11.5-15.5 - %                  PLT - 286  150-400 - K/uL

## 2022-08-30 NOTE — Anesthesia Preprocedure Evaluation (Signed)
Anesthesia Evaluation  Patient identified by MRN, date of birth, ID band Patient awake    Reviewed: Allergy & Precautions, NPO status , Patient's Chart, lab work & pertinent test results  History of Anesthesia Complications (+) PONV and history of anesthetic complications  Airway Mallampati: II  TM Distance: >3 FB Neck ROM: Full    Dental no notable dental hx. (+) Teeth Intact   Pulmonary former smoker   Pulmonary exam normal breath sounds clear to auscultation       Cardiovascular Normal cardiovascular exam Rhythm:Regular Rate:Normal     Neuro/Psych   Anxiety        GI/Hepatic negative GI ROS, Neg liver ROS,,,  Endo/Other    Renal/GU negative Renal ROS     Musculoskeletal negative musculoskeletal ROS (+)    Abdominal   Peds  Hematology PCOS Lab Results      Component                Value               Date                      WBC                      5.3                 08/24/2022                HGB                      12.1                08/24/2022                HCT                      38.4                08/24/2022                MCV                      82.2                08/24/2022                PLT                      288                 08/24/2022              Anesthesia Other Findings All: latex  Reproductive/Obstetrics                             Anesthesia Physical Anesthesia Plan  ASA: 2  Anesthesia Plan: General   Post-op Pain Management: Ofirmev IV (intra-op)*, Toradol IV (intra-op)* and Precedex   Induction: Intravenous  PONV Risk Score and Plan: Treatment may vary due to age or medical condition, Propofol infusion, Midazolam, TIVA, Ondansetron and Dexamethasone  Airway Management Planned: LMA  Additional Equipment: None  Intra-op Plan:   Post-operative Plan: Extubation in OR  Informed Consent: I have reviewed the patients History and Physical,  chart, labs and discussed the procedure including the risks, benefits  and alternatives for the proposed anesthesia with the patient or authorized representative who has indicated his/her understanding and acceptance.     Dental advisory given  Plan Discussed with:   Anesthesia Plan Comments:        Anesthesia Quick Evaluation

## 2022-08-30 NOTE — H&P (Signed)
Subjective:    Chief Complaint(s): Preop/ Heavy menses    HPI:      General 37 y/o presents for preop visit. Pt is schedule for a  hysteroscopy, dilation and currettage and hydrothermal ablation on 08/31/2022 for the management of menorrhagia, thickened endometrium, and iron deficiency anemia.  --- IN REVIEW:  Visit on 06/15/2022, patient reported trying the [facility] for 1 month. she had some night sweats at first. she reports still having heavy menses. she went into the following  month and was not taking the myfembree consistently. she had irregular bleeding. she has required iron infusion since her last visit. she reports being tired of bleeding.  --- Visit on 03/17/2022, reported menses are still heavy even with the lysteda. she gets dizzy with the lyseda and is unable to take 3 doses a day. she changes a pad every 1 1/2 hours with the lysteda . SHe wears a thick overnight pad ( 2 pads) . Pt was given Myfembree sample. --- Patient states that she had bleeding from August until October 7. she was saturating 2 overnight pads in an hour on her heaviest day. she reports feeling very tired. she had an iron infusion July 26th. Her Hgb on 11/18/2021 was 12.5. Visit on 11/09/2021, pt reported is bleeding heavy with clots. States that she has bleed continuously since August with only stopping bleeding maybe a day or so. Concerns that OCP has made her bleeding worse. Has taken 2 pills OCP on some extremely heavy flow days. Pt would like to stop OCP to monitor how bleeding will be affected. GYN ULTRASOUND FINDINGS ON 11/09/2021: anteverted uterus Uterus inhomogeneous in appearance-? uterine fibroid-post submucosal 2.0x1.8x1.4cm Endometrium thickened and irregular in appearance. Endo 1.02cm bilateral OV's enl No adnexal mases seen EMB performed on 11/09/2021 was negative. On 08/15 /2023 she was switched from orthotricyclene to [place]. Her bleeding got worse on the jolessa. Pt was started on Lysteda on  11/23/2021.      Current Medication:     Taking Mesalamine ER 0.375 GM Capsule Extended Release 24 Hour 4 capsules in the morning Orally Once a day. MiraLax 17 GM/SCOOP Powder as directed Orally. ZyrTEC Allergy 10 MG Tablet 1 tablet Orally Once a day. Medication List reviewed and reconciled with the patient.    Medical History: iron deficiency Anemia s/p iron infusion x2 followed by hematology PCOS Umbilical Hernia Hemorrhoids Chronic Constipation Crohn's Colitis (Dx 05/07/20 w/ Colonoscopy - Dr. Ewing Schlein / Celso Amy, PA-C) seasonal allergies-followed by [facility]    Allergies/Intolerance: Latex: Allergy - rash Peanut-containing Drug Products: Allergy - anything peanut tree: Allergy  Unstructured/Uncoded allergies will not be considered for drug-allergy warnings. Please remove and re-enter them in structured/coded format. grass pollen: Allergy  Unstructured/Uncoded allergies will not be considered for drug-allergy warnings. Please remove and re-enter them in structured/coded format. Tree Nuts: Allergy - hives    Gyn History: Sexual activity currently sexually active.  Periods : Every 21-28 days.  LMP 07/10/2022.  Denies Birth control.  Last pap smear date 03/27/2019-negative.  Denies Last mammogram date.  Denies Abnormal pap smear.  H/O STD Chlamydia, Gonorrhea at early age.  Menarche 10.  GYN procedures D/C 2003.     OB History: Number of pregnancies  6.  miscarriages  1.  abortion  1.  Pregnancy # 1  abortion.  Pregnancy # 2  miscarriage.  Pregnancy # 3  live birth, vaginal delivery, boy.  Pregnancy # 4:  live birth, vaginal delivery, girl 04/17/2012, [facility]  Pregnancy #  5:  live birth, vaginal delivery, boy.  Pregnancy # 6  live birth, vaginal delivery.     Surgical History: D & C 2003 colonoscopy 04/2020 umblica hernia repair 11/18/20    Hospitalization: childbirth x4, had to have blood transfusion after childbirth 2010, 2014, 2019, 2021    Family  History: Father: deceased, Diabetes, diagnosed with Diabetes Mother: alive, MS Paternal Grand Father: deceased, diagnosed with Diabetes Paternal Grand Mother: deceased Maternal Grand Father: deceased, diagnosed with Hypertension, Prostate CA Maternal Grand Mother: deceased Brother 1: alive, Crohn's Disease 2 son(s) , 2 daughter(s) - healthy. No Family History of Colon Cancer, Polyps, or Liver Disease. Mother with MS.    Social History:      General Tobacco use:   cigarettes:  Former smoker, Quit in year  2013, Tobacco history last updated  08/16/2022, Vaping  No.  EXPOSURE TO PASSIVE SMOKE:  socially. Alcohol:  yes 1 drink or less. Caffeine:  yes coffee, soda, tea, occasionally. Recreational drug use:  no no. DIET:  no particular dietary program. Exercise:  no. Marital Status:  married. Children:  2 boys, 2 girls. 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    ROS:      CONSTITUTIONAL Chills  No.  Fatigue  No.  Fever  No.  Night sweats  No.  Recent travel outside [place]  No.  Sweats  No.  Weight change  No.       OPHTHALMOLOGY Blurring of vision  no.  Change in vision  no.  Double vision  no.       ENT Dizziness  no.  Nose bleeds  no.  Sore throat  no.  Teeth pain  no.       ALLERGY Hives  no.       CARDIOLOGY Chest pain  no.  High blood pressure  no.  Irregular heart beat  no.  Leg edema  no.  Palpitations  no.       RESPIRATORY Shortness of breath  no.  Cough  no.  Wheezing  no.       UROLOGY Pain with urination  no.  Urinary urgency  no.  Urinary frequency  no.  Urinary incontinence  no.  Difficulty urinating  No.  Blood in urine  No.       GASTROENTEROLOGY Abdominal pain  no.  Appetite change  no.  Bloating/belching  no.  Blood in stool or on toilet paper  no.  Change in bowel movements  no.  Constipation  no.  Diarrhea  no.  Difficulty swallowing  no.  Nausea  no.       FEMALE REPRODUCTIVE Vulvar pain  no.  Vulvar rash  no.  Abnormal vaginal  bleeding  , heavy menses.  Breast pain  no.  Nipple discharge  no.  Pain with intercourse  no.  Pelvic pain  no.  Unusual vaginal discharge  no.  Vaginal itching  no.       MUSCULOSKELETAL Muscle aches  no.       NEUROLOGY Headache  no.  Tingling/numbness  no.  Weakness  no.       PSYCHOLOGY Depression  no.  Anxiety  no.  Nervousness  no.  Sleep disturbances  no.  Suicidal ideation  no .       ENDOCRINOLOGY Excessive thirst  no.  Excessive urination  no.  Hair loss  no.  Heat or cold intolerance  no.       HEMATOLOGY/LYMPH Abnormal bleeding  no.  Easy bruising  no.  Swollen glands  no.       DERMATOLOGY New/changing skin lesion  no.  Rash  no.  Sores  no.  Negative except as stated in HPI.   Objective:    Vitals: Wt: 220.8, Wt change: 3.8 lbs, Ht: 64, BMI: 37.9, Pulse sitting: 80, BP sitting: 128/79.    Past Results:    Examination:      General Examination CONSTITUTIONAL: alert, oriented, NAD.  SKIN:  moist, warm.  EYES:  Conjunctiva clear.  LUNGS: good I:E efffort noted, clear to auscultation bilaterally.  HEART: regular rate and rhythm.  ABDOMEN: soft, non-tender/non-distended, bowel sounds present.  FEMALE GENITOURINARY: normal external genitalia, labia - unremarkable, vagina - pink moist mucosa, no lesions or abnormal discharge, cervix - no discharge or lesions or CMT, adnexa - no masses or tenderness, uterus - nontender and normal size on palpation.  EXTREMITIES: no edema present.  PSYCH:  affect normal, good eye contact.     Physical Examination:      Chaperone present Chaperone present Guest1, eCW Morene Rankins 08/16/2022 02:19:48 PM >, for pelvic exam.    Assessment:    Assessment: Menorrhagia with irregular cycle - N92.1 (Primary) Fibroid uterus - D25.9 Thickened endometrium - R93.89 Iron deficiency anemia - D50.9 Vaginal discharge - N89.8   Plan:    Treatment:     Menorrhagia with irregular cycle  Lab:CBC without Diff (Collection Date & Time - 08/16/2022  02:30 PM)      Notes: Pt is scheduled for hysteroscopy d/c with hydrothermal ablation for management of/secondary to menorrhagia with an irregular cycle. Pt is advised she will be able to return home the same day. Discussed risks of hysteroscopy including but not limited to infection, bleeding, possible perforation of the uterus, with the need for further surgery. Pt advised to avoid NSAIDs (Aspirin, Aleve, Advil, Ibuprofen, Motrin) from now until surgery given risk of bleeding during surgery. She may take Tylenol for pain management. She is advised to avoid eating or drinking starting midnight prior to surgery. Pt is advised that she may have watery discharge or cramping after surgery. Discussed post-surgery avoidance of driving for 24 hours or intercourse for 2 weeks after procedure.     Fibroid uterus     Notes: patient declined definitive therapy with hysterectomy she prefers HTA for menorhagia.     Thickened endometrium     Notes: Pt is scheduled for hysteroscopy d/c with hydrothermal ablation for management of/secondary to menorrhagia with an irregular cycle. Pt is advised she will be able to return home the same day. Discussed risks of hysteroscopy including but not limited to infection, bleeding, possible perforation of the uterus, with the need for further surgery. Pt advised to avoid NSAIDs (Aspirin, Aleve, Advil, Ibuprofen, Motrin) from now until surgery given risk of bleeding during surgery. She may take Tylenol for pain management. She is advised to avoid eating or drinking starting midnight prior to surgery. Pt is advised that she may have watery discharge or cramping after surgery. Discussed post-surgery avoidance of driving for 24 hours or intercourse for 2 weeks after procedure.     Iron deficiency anemia     Notes: Pt is scheduled for hysteroscopy d/c with hydrothermal ablation for management of/secondary to menorrhagia with an irregular cycle. Pt is advised she will be able to return  home the same day. Discussed risks of hysteroscopy including but not limited to infection, bleeding, possible perforation of the uterus, with the need for further  surgery. Pt advised to avoid NSAIDs (Aspirin, Aleve, Advil, Ibuprofen, Motrin) from now until surgery given risk of bleeding during surgery. She may take Tylenol for pain management. She is advised to avoid eating or drinking starting midnight prior to surgery. Pt is advised that she may have watery discharge or cramping after surgery. Discussed post-surgery avoidance of driving for 24 hours or intercourse for 2 weeks after procedure.     Vaginal discharge  Lab:[facility] (Collection Date & Time - 08/16/2022 02:30 PM)     Procedures:    Immunizations:    Therapeutic Injections:    Diagnostic Imaging:    Lab Reports:  Lab:[facility] (Collection Date & Time - 08/16/2022 02:30 PM)  Normal                   Value Reference Range                  WBCS - Normal  Normal -                  CLUE CELLS - None seen  None seen -                  TRICHOMONAS - None Seen  None Seen -                  YEAST - None seen  None seen -   Notes: COLE, TARA 08/17/2022 02:01:05 PM > no evideence of infection  Lab:CBC without Diff (Collection Date & Time - 08/16/2022 02:30 PM)  Low                   Value Reference Range                  WBC - 5.2  4.0-11.0 - K/ul                  RBC - 4.18 L 4.20-5.40 - M/uL                  HGB - 11.0 L 12.0-16.0 - g/dL                  HCT - 34.0 L 37.0-47.0 - %                  MCV - 81.4  81.0-99.0 - fL                  MCH - 26.2 L 27.0-33.0 - pg                  MCHC - 32.3  32.0-36.0 - g/dL                  RDW - 16.0 H 11.5-15.5 - %                  PLT - 286  150-400 - K/uL

## 2022-08-31 ENCOUNTER — Ambulatory Visit (HOSPITAL_BASED_OUTPATIENT_CLINIC_OR_DEPARTMENT_OTHER): Payer: 59 | Admitting: Anesthesiology

## 2022-08-31 ENCOUNTER — Other Ambulatory Visit: Payer: Self-pay

## 2022-08-31 ENCOUNTER — Ambulatory Visit (HOSPITAL_BASED_OUTPATIENT_CLINIC_OR_DEPARTMENT_OTHER)
Admission: RE | Admit: 2022-08-31 | Discharge: 2022-08-31 | Disposition: A | Discharge status: Home / Self Care | Payer: 59 | Attending: Obstetrics and Gynecology | Admitting: Obstetrics and Gynecology

## 2022-08-31 ENCOUNTER — Encounter (HOSPITAL_BASED_OUTPATIENT_CLINIC_OR_DEPARTMENT_OTHER): Payer: Self-pay | Admitting: Obstetrics and Gynecology

## 2022-08-31 ENCOUNTER — Encounter (HOSPITAL_BASED_OUTPATIENT_CLINIC_OR_DEPARTMENT_OTHER)
Admission: RE | Disposition: A | Discharge status: Home / Self Care | Payer: Self-pay | Source: Home / Self Care | Attending: Obstetrics and Gynecology

## 2022-08-31 ENCOUNTER — Encounter: Payer: Self-pay | Admitting: Family

## 2022-08-31 ENCOUNTER — Other Ambulatory Visit (HOSPITAL_BASED_OUTPATIENT_CLINIC_OR_DEPARTMENT_OTHER): Payer: Self-pay

## 2022-08-31 DIAGNOSIS — E282 Polycystic ovarian syndrome: Secondary | ICD-10-CM | POA: Diagnosis not present

## 2022-08-31 DIAGNOSIS — Z87891 Personal history of nicotine dependence: Secondary | ICD-10-CM | POA: Insufficient documentation

## 2022-08-31 DIAGNOSIS — D259 Leiomyoma of uterus, unspecified: Secondary | ICD-10-CM

## 2022-08-31 DIAGNOSIS — N921 Excessive and frequent menstruation with irregular cycle: Secondary | ICD-10-CM | POA: Insufficient documentation

## 2022-08-31 DIAGNOSIS — F419 Anxiety disorder, unspecified: Secondary | ICD-10-CM | POA: Diagnosis not present

## 2022-08-31 DIAGNOSIS — D509 Iron deficiency anemia, unspecified: Secondary | ICD-10-CM | POA: Insufficient documentation

## 2022-08-31 DIAGNOSIS — Z01818 Encounter for other preprocedural examination: Secondary | ICD-10-CM

## 2022-08-31 DIAGNOSIS — R9389 Abnormal findings on diagnostic imaging of other specified body structures: Secondary | ICD-10-CM | POA: Diagnosis not present

## 2022-08-31 DIAGNOSIS — N920 Excessive and frequent menstruation with regular cycle: Secondary | ICD-10-CM | POA: Diagnosis present

## 2022-08-31 HISTORY — PX: DILITATION & CURRETTAGE/HYSTROSCOPY WITH HYDROTHERMAL ABLATION: SHX5570

## 2022-08-31 HISTORY — DX: Iron deficiency anemia, unspecified: D50.9

## 2022-08-31 HISTORY — DX: Other specified postprocedural states: Z98.890

## 2022-08-31 HISTORY — DX: Polycystic ovarian syndrome: E28.2

## 2022-08-31 HISTORY — DX: Crohn's disease, unspecified, without complications: K50.90

## 2022-08-31 LAB — POCT PREGNANCY, URINE: Preg Test, Ur: NEGATIVE

## 2022-08-31 SURGERY — DILATATION & CURETTAGE/HYSTEROSCOPY WITH HYDROTHERMAL ABLATION
Anesthesia: General | Site: Uterus

## 2022-08-31 MED ORDER — OXYCODONE HCL 5 MG/5ML PO SOLN: 5.0000 mg | Freq: Once | ORAL | Status: AC | PRN

## 2022-08-31 MED ORDER — PROPOFOL 500 MG/50ML IV EMUL
INTRAVENOUS | Status: DC | PRN
  Administered 2022-08-31: 200 ug/kg/min via INTRAVENOUS

## 2022-08-31 MED ORDER — DEXMEDETOMIDINE HCL IN NACL 80 MCG/20ML IV SOLN
INTRAVENOUS | Status: DC | PRN
  Administered 2022-08-31 (×3): 4 ug via INTRAVENOUS

## 2022-08-31 MED ORDER — SODIUM CHLORIDE 0.9 % IR SOLN
Status: DC | PRN
  Administered 2022-08-31: 3000 mL

## 2022-08-31 MED ORDER — ONDANSETRON HCL 4 MG/2ML IJ SOLN
INTRAMUSCULAR | Status: DC | PRN
Start: 2022-08-31 — End: 2022-08-31
  Administered 2022-08-31: 4 mg via INTRAVENOUS

## 2022-08-31 MED ORDER — MIDAZOLAM HCL 5 MG/5ML IJ SOLN
INTRAMUSCULAR | Status: DC | PRN
  Administered 2022-08-31: 2 mg via INTRAVENOUS

## 2022-08-31 MED ORDER — PROPOFOL 10 MG/ML IV BOLUS
INTRAVENOUS | Status: DC | PRN
  Administered 2022-08-31: 180 mg via INTRAVENOUS
  Administered 2022-08-31: 20 mg via INTRAVENOUS

## 2022-08-31 MED ORDER — DEXAMETHASONE SODIUM PHOSPHATE 10 MG/ML IJ SOLN
INTRAMUSCULAR | Status: AC
  Filled 2022-08-31: qty 1

## 2022-08-31 MED ORDER — KETOROLAC TROMETHAMINE 30 MG/ML IJ SOLN
INTRAMUSCULAR | Status: AC
  Filled 2022-08-31: qty 1

## 2022-08-31 MED ORDER — MIDAZOLAM HCL 2 MG/2ML IJ SOLN
INTRAMUSCULAR | Status: AC
  Filled 2022-08-31: qty 2

## 2022-08-31 MED ORDER — BUPIVACAINE HCL (PF) 0.25 % IJ SOLN
INTRAMUSCULAR | Status: DC | PRN
  Administered 2022-08-31: 20 mL

## 2022-08-31 MED ORDER — LIDOCAINE HCL (PF) 2 % IJ SOLN
INTRAMUSCULAR | Status: AC
  Filled 2022-08-31: qty 5

## 2022-08-31 MED ORDER — KETOROLAC TROMETHAMINE 30 MG/ML IJ SOLN
INTRAMUSCULAR | Status: DC | PRN
  Administered 2022-08-31: 30 mg via INTRAVENOUS

## 2022-08-31 MED ORDER — HYDROMORPHONE HCL 1 MG/ML IJ SOLN
INTRAMUSCULAR | Status: AC
  Filled 2022-08-31: qty 1

## 2022-08-31 MED ORDER — KETOROLAC TROMETHAMINE 30 MG/ML IJ SOLN: 30.0000 mg | Freq: Once | INTRAMUSCULAR | Status: DC | PRN

## 2022-08-31 MED ORDER — ACETAMINOPHEN 500 MG PO TABS
1000.0000 mg | ORAL_TABLET | ORAL | Status: AC
  Administered 2022-08-31: 1000 mg via ORAL

## 2022-08-31 MED ORDER — DEXAMETHASONE SODIUM PHOSPHATE 10 MG/ML IJ SOLN
INTRAMUSCULAR | Status: DC | PRN
  Administered 2022-08-31: 10 mg via INTRAVENOUS

## 2022-08-31 MED ORDER — HYDROMORPHONE HCL 1 MG/ML IJ SOLN
0.2500 mg | INTRAMUSCULAR | Status: DC | PRN
  Administered 2022-08-31 (×3): 0.25 mg via INTRAVENOUS

## 2022-08-31 MED ORDER — ONDANSETRON HCL 4 MG/2ML IJ SOLN
INTRAMUSCULAR | Status: AC
  Filled 2022-08-31: qty 2

## 2022-08-31 MED ORDER — OXYCODONE HCL 5 MG PO TABS
5.0000 mg | ORAL_TABLET | Freq: Once | ORAL | Status: AC | PRN
  Administered 2022-08-31: 5 mg via ORAL

## 2022-08-31 MED ORDER — LACTATED RINGERS IV SOLN: INTRAVENOUS | Status: DC

## 2022-08-31 MED ORDER — FENTANYL CITRATE (PF) 100 MCG/2ML IJ SOLN
INTRAMUSCULAR | Status: AC
  Filled 2022-08-31: qty 2

## 2022-08-31 MED ORDER — PROPOFOL 1000 MG/100ML IV EMUL
INTRAVENOUS | Status: AC
  Filled 2022-08-31: qty 100

## 2022-08-31 MED ORDER — POVIDONE-IODINE 10 % EX SWAB: 2.0000 | Freq: Once | CUTANEOUS | Status: DC

## 2022-08-31 MED ORDER — ACETAMINOPHEN 500 MG PO TABS
ORAL_TABLET | ORAL | Status: AC
  Filled 2022-08-31: qty 2

## 2022-08-31 MED ORDER — ACETAMINOPHEN 500 MG PO TABS
1000.0000 mg | ORAL_TABLET | Freq: Three times a day (TID) | ORAL | 0 refills | Status: DC | PRN
  Filled 2022-08-31: qty 100, 17d supply, fill #0

## 2022-08-31 MED ORDER — OXYCODONE HCL 5 MG PO TABS
5.0000 mg | ORAL_TABLET | Freq: Four times a day (QID) | ORAL | 0 refills | Status: DC | PRN
  Filled 2022-08-31: qty 15, 4d supply, fill #0

## 2022-08-31 MED ORDER — LIDOCAINE 2% (20 MG/ML) 5 ML SYRINGE
INTRAMUSCULAR | Status: DC | PRN
  Administered 2022-08-31: 80 mg via INTRAVENOUS

## 2022-08-31 MED ORDER — OXYCODONE HCL 5 MG PO TABS
ORAL_TABLET | ORAL | Status: AC
  Filled 2022-08-31: qty 1

## 2022-08-31 MED ORDER — FENTANYL CITRATE (PF) 100 MCG/2ML IJ SOLN
INTRAMUSCULAR | Status: DC | PRN
  Administered 2022-08-31: 25 ug via INTRAVENOUS
  Administered 2022-08-31: 50 ug via INTRAVENOUS
  Administered 2022-08-31: 25 ug via INTRAVENOUS

## 2022-08-31 MED ORDER — ONDANSETRON HCL 4 MG/2ML IJ SOLN: 4.0000 mg | Freq: Once | INTRAMUSCULAR | Status: DC | PRN

## 2022-08-31 SURGICAL SUPPLY — 20 items
CATH ROBINSON RED A/P 16FR (CATHETERS) ×1 IMPLANT
DRSG TELFA 3X8 NADH STRL (GAUZE/BANDAGES/DRESSINGS) ×1 IMPLANT
ELECT REM PT RETURN 9FT ADLT (ELECTROSURGICAL)
ELECTRODE REM PT RTRN 9FT ADLT (ELECTROSURGICAL) IMPLANT
GAUZE 4X4 16PLY ~~LOC~~+RFID DBL (SPONGE) ×1 IMPLANT
GLOVE BIOGEL M 6.5 STRL (GLOVE) ×2 IMPLANT
GLOVE BIOGEL PI IND STRL 6 (GLOVE) IMPLANT
GLOVE BIOGEL PI IND STRL 6.5 (GLOVE) ×2 IMPLANT
GLOVE BIOGEL PI IND STRL 7.0 (GLOVE) ×1 IMPLANT
GLOVE SURG SS PI 6.0 STRL IVOR (GLOVE) IMPLANT
GOWN STRL REUS W/TWL LRG LVL3 (GOWN DISPOSABLE) ×2 IMPLANT
KIT TURNOVER CYSTO (KITS) ×1 IMPLANT
PACK VAGINAL MINOR WOMEN LF (CUSTOM PROCEDURE TRAY) ×1 IMPLANT
PAD OB MATERNITY 4.3X12.25 (PERSONAL CARE ITEMS) ×1 IMPLANT
SEAL ROD LENS SCOPE MYOSURE (ABLATOR) ×1 IMPLANT
SET GENESYS HTA PROCERVA (MISCELLANEOUS) IMPLANT
SLEEVE SCD COMPRESS KNEE MED (STOCKING) ×1 IMPLANT
SPIKE FLUID TRANSFER (MISCELLANEOUS) IMPLANT
TOWEL OR 17X24 6PK STRL BLUE (TOWEL DISPOSABLE) ×2 IMPLANT
UNDERPAD 30X36 HEAVY ABSORB (UNDERPADS AND DIAPERS) ×1 IMPLANT

## 2022-08-31 NOTE — Discharge Instructions (Signed)
No acetaminophen/Tylenol until after 6:17pm today if needed for pain.    No ibuprofen, Advil, Aleve, Motrin, ketorolac, meloxicam, naproxen, or other NSAIDS until after 9:15pm today if needed for pain.    DISCHARGE INSTRUCTIONS: HYSTEROSCOPY / ENDOMETRIAL ABLATION The following instructions have been prepared to help you care for yourself upon your return home.   Personal hygiene:  Use sanitary pads for vaginal drainage, not tampons.  Shower the day after your procedure.  NO tub baths, pools or Jacuzzis for 2-3 weeks.  Wipe front to back after using the bathroom.  Activity and limitations:  Do NOT drive or operate any equipment for 24 hours. The effects of anesthesia are still present and drowsiness may result.  Do NOT rest in bed all day.  Walking is encouraged.  Walk up and down stairs slowly.  You may resume your normal activity in one to two days or as indicated by your physician. Sexual activity: NO intercourse for at least 2 weeks after the procedure, or as indicated by your Doctor.  Diet: Eat a light meal as desired this evening. You may resume your usual diet tomorrow.  Return to Work: You may resume your work activities in one to two days or as indicated by Therapist, sports.  What to expect after your surgery: Expect to have vaginal bleeding/discharge for 2-3 days and spotting for up to 10 days. It is not unusual to have soreness for up to 1-2 weeks. You may have a slight burning sensation when you urinate for the first day. Mild cramps may continue for a couple of days. You may have a regular period in 2-6 weeks.  Call your doctor for any of the following:  Excessive vaginal bleeding or clotting, saturating and changing one pad every hour.  Inability to urinate 6 hours after discharge from hospital.  Pain not relieved by pain medication.  Fever of 100.4 F or greater.  Unusual vaginal discharge or odor.    Post Anesthesia Home Care Instructions  Activity: Get  plenty of rest for the remainder of the day. A responsible individual must stay with you for 24 hours following the procedure.  For the next 24 hours, DO NOT: -Drive a car -Advertising copywriter -Drink alcoholic beverages -Take any medication unless instructed by your physician -Make any legal decisions or sign important papers.  Meals: Start with liquid foods such as gelatin or soup. Progress to regular foods as tolerated. Avoid greasy, spicy, heavy foods. If nausea and/or vomiting occur, drink only clear liquids until the nausea and/or vomiting subsides. Call your physician if vomiting continues.  Special Instructions/Symptoms: Your throat may feel dry or sore from the anesthesia or the breathing tube placed in your throat during surgery. If this causes discomfort, gargle with warm salt water. The discomfort should disappear within 24 hours.

## 2022-08-31 NOTE — Op Note (Signed)
08/31/2022  3:25 PM  PATIENT:  Michelle Pacheco  37 y.o. female  PRE-OPERATIVE DIAGNOSIS:  Iron deficiency anemia Menorrhagia with irregular cycle Thickened endometrium Fibroid uterus  POST-OPERATIVE DIAGNOSIS:  Iron deficiency anemiaMenorrhagia with irregular cycleThickened endometriumFibroid uterus  PROCEDURE:  Procedure(s): DILATATION & CURETTAGE/HYSTEROSCOPY WITH HYDROTHERMAL ABLATION (N/A)  SURGEON:  Surgeons and Role:    Gerald Leitz, MD - Primary  PHYSICIAN ASSISTANT:   ASSISTANTS: none   ANESTHESIA:   general  EBL:  50 mL   BLOOD ADMINISTERED:none  DRAINS: none   LOCAL MEDICATIONS USED:  MARCAINE     SPECIMEN:  Source of Specimen:  endometrial currettings   DISPOSITION OF SPECIMEN:  PATHOLOGY  COUNTS:  YES  TOURNIQUET:  * No tourniquets in log *  DICTATION: .Note written in EPIC  PLAN OF CARE: Discharge to home after PACU  PATIENT DISPOSITION:  PACU - hemodynamically stable.   Delay start of Pharmacological VTE agent (>24hrs) due to surgical blood loss or risk of bleeding: not applicable  Findings: normal external genitalia, normal appearing cervix. Proliferative endometrium   Procedure. The patient was taken to the operative room#1 [facility]. A time out was performed. She was placed in the dorsal lithotomy position and prepped and draped in the normal sterile fashion. A speculum was placed in the vaginal vault. The anterior lip of the cervix was grasped with a single tooth tenaculum. The uterus was sounded to 8 cm. The cervix was dilated to 8 mm. The hydrothermal hysteroscope was inserted. The ostia were visualized bilaterally. There was no evidence of perforation. The hydrothermal ablation was performed for 10 minutes.  The hysteroscope was removed. A sharp curette was used to obtain endometrial curettings.  10 cc of 25% marcaine was injected at the 4 and 8 oclock position.  The single tooth tenaculum was removed.  Excellent hemostasis was noted.   Sponge lap  and needle counts were correct x 2.  The patient was awakened from anesthesia and taken to the recovery room in stable condition.

## 2022-08-31 NOTE — Anesthesia Postprocedure Evaluation (Signed)
Anesthesia Post Note  Patient: Michelle Pacheco  Procedure(s) Performed: DILATATION & CURETTAGE/HYSTEROSCOPY WITH HYDROTHERMAL ABLATION (Uterus)     Patient location during evaluation: PACU Anesthesia Type: General Level of consciousness: awake and alert Pain management: pain level controlled Vital Signs Assessment: post-procedure vital signs reviewed and stable Respiratory status: spontaneous breathing, nonlabored ventilation, respiratory function stable and patient connected to nasal cannula oxygen Cardiovascular status: blood pressure returned to baseline and stable Postop Assessment: no apparent nausea or vomiting Anesthetic complications: no   No notable events documented.  Last Vitals:  Vitals:   08/31/22 1615 08/31/22 1640  BP: (!) 160/90 (!) 145/90  Pulse: (!) 59 71  Resp: 10 14  Temp:  36.9 C  SpO2: 100% 100%    Last Pain:  Vitals:   08/31/22 1640  TempSrc:   PainSc: 5                  Trevor Iha

## 2022-08-31 NOTE — Transfer of Care (Signed)
Immediate Anesthesia Transfer of Care Note  Patient: Michelle Pacheco  Procedure(s) Performed: DILATATION & CURETTAGE/HYSTEROSCOPY WITH HYDROTHERMAL ABLATION (Uterus)  Patient Location: PACU  Anesthesia Type:General  Level of Consciousness: drowsy and patient cooperative  Airway & Oxygen Therapy: Patient Spontanous Breathing and Patient connected to face mask oxygen  Post-op Assessment: Report given to RN and Post -op Vital signs reviewed and stable  Post vital signs: Reviewed and stable  Last Vitals:  Vitals Value Taken Time  BP 137/82 08/31/22 1530  Temp    Pulse 77 08/31/22 1531  Resp 16 08/31/22 1531  SpO2 100 % 08/31/22 1531  Vitals shown include unfiled device data.  Last Pain:  Vitals:   08/31/22 1225  TempSrc: Oral  PainSc: 0-No pain      Patients Stated Pain Goal: 5 (08/31/22 1225)  Complications: No notable events documented.

## 2022-08-31 NOTE — H&P (Signed)
Date of Initial H&P: 08/30/2022  History reviewed, patient examined, no change in status, stable for surgery.

## 2022-08-31 NOTE — Anesthesia Procedure Notes (Signed)
Procedure Name: LMA Insertion Date/Time: 08/31/2022 2:34 PM  Performed by: [place] Basham D, CRNAPre-anesthesia Checklist: Patient identified, Emergency Drugs available, Suction available and Patient being monitored Patient Re-evaluated:Patient Re-evaluated prior to induction Oxygen Delivery Method: Circle system utilized Preoxygenation: Pre-oxygenation with 100% oxygen Induction Type: IV induction Ventilation: Mask ventilation without difficulty LMA: LMA inserted LMA Size: 4.0 Tube type: Oral Number of attempts: 1 Placement Confirmation: positive ETCO2 and breath sounds checked- equal and bilateral Tube secured with: Tape Dental Injury: Teeth and Oropharynx as per pre-operative assessment

## 2022-09-01 LAB — SURGICAL PATHOLOGY

## 2022-09-05 ENCOUNTER — Encounter (HOSPITAL_BASED_OUTPATIENT_CLINIC_OR_DEPARTMENT_OTHER): Payer: Self-pay | Admitting: Obstetrics and Gynecology

## 2022-09-06 ENCOUNTER — Other Ambulatory Visit (HOSPITAL_BASED_OUTPATIENT_CLINIC_OR_DEPARTMENT_OTHER): Payer: Self-pay

## 2022-09-06 DIAGNOSIS — K50111 Crohn's disease of large intestine with rectal bleeding: Secondary | ICD-10-CM | POA: Diagnosis not present

## 2022-09-06 DIAGNOSIS — R102 Pelvic and perineal pain: Secondary | ICD-10-CM | POA: Diagnosis not present

## 2022-09-06 DIAGNOSIS — N898 Other specified noninflammatory disorders of vagina: Secondary | ICD-10-CM | POA: Diagnosis not present

## 2022-09-06 DIAGNOSIS — Z9889 Other specified postprocedural states: Secondary | ICD-10-CM | POA: Diagnosis not present

## 2022-09-06 DIAGNOSIS — Z4889 Encounter for other specified surgical aftercare: Secondary | ICD-10-CM | POA: Diagnosis not present

## 2022-09-06 MED ORDER — PREDNISONE 10 MG PO TABS
ORAL_TABLET | ORAL | 0 refills | Status: AC
  Filled 2022-09-06: qty 74, 34d supply, fill #0

## 2022-09-06 MED ORDER — FLUCONAZOLE 200 MG PO TABS
200.0000 mg | ORAL_TABLET | ORAL | 0 refills | Status: DC | PRN
  Filled 2022-09-06: qty 2, 6d supply, fill #0

## 2022-09-06 MED ORDER — METRONIDAZOLE 500 MG PO TABS
500.0000 mg | ORAL_TABLET | Freq: Two times a day (BID) | ORAL | 0 refills | Status: DC
  Filled 2022-09-06: qty 14, 7d supply, fill #0

## 2022-09-08 DIAGNOSIS — K50111 Crohn's disease of large intestine with rectal bleeding: Secondary | ICD-10-CM | POA: Diagnosis not present

## 2022-09-08 DIAGNOSIS — R9389 Abnormal findings on diagnostic imaging of other specified body structures: Secondary | ICD-10-CM | POA: Diagnosis not present

## 2022-09-08 DIAGNOSIS — R102 Pelvic and perineal pain: Secondary | ICD-10-CM | POA: Diagnosis not present

## 2022-09-13 ENCOUNTER — Other Ambulatory Visit: Payer: Self-pay | Admitting: Family

## 2022-09-13 DIAGNOSIS — D500 Iron deficiency anemia secondary to blood loss (chronic): Secondary | ICD-10-CM

## 2022-09-14 ENCOUNTER — Inpatient Hospital Stay (HOSPITAL_BASED_OUTPATIENT_CLINIC_OR_DEPARTMENT_OTHER): Payer: 59 | Admitting: Family

## 2022-09-14 ENCOUNTER — Encounter: Payer: Self-pay | Admitting: Family

## 2022-09-14 ENCOUNTER — Inpatient Hospital Stay: Payer: 59

## 2022-09-14 VITALS — BP 133/69 | HR 70 | Temp 98.6°F | Resp 18 | Wt 212.0 lb

## 2022-09-14 DIAGNOSIS — D500 Iron deficiency anemia secondary to blood loss (chronic): Secondary | ICD-10-CM

## 2022-09-14 DIAGNOSIS — K909 Intestinal malabsorption, unspecified: Secondary | ICD-10-CM | POA: Diagnosis not present

## 2022-09-14 DIAGNOSIS — Z9889 Other specified postprocedural states: Secondary | ICD-10-CM | POA: Diagnosis not present

## 2022-09-14 DIAGNOSIS — K50111 Crohn's disease of large intestine with rectal bleeding: Secondary | ICD-10-CM | POA: Diagnosis not present

## 2022-09-14 DIAGNOSIS — N898 Other specified noninflammatory disorders of vagina: Secondary | ICD-10-CM | POA: Diagnosis not present

## 2022-09-14 DIAGNOSIS — R350 Frequency of micturition: Secondary | ICD-10-CM | POA: Diagnosis not present

## 2022-09-14 DIAGNOSIS — R102 Pelvic and perineal pain: Secondary | ICD-10-CM | POA: Diagnosis not present

## 2022-09-14 DIAGNOSIS — N920 Excessive and frequent menstruation with regular cycle: Secondary | ICD-10-CM | POA: Diagnosis not present

## 2022-09-14 LAB — CBC WITH DIFFERENTIAL (CANCER CENTER ONLY)
Abs Immature Granulocytes: 0.01 10*3/uL (ref 0.00–0.07)
Basophils Absolute: 0 10*3/uL (ref 0.0–0.1)
Basophils Relative: 1 %
Eosinophils Absolute: 0.1 10*3/uL (ref 0.0–0.5)
Eosinophils Relative: 2 %
HCT: 37.7 % (ref 36.0–46.0)
Hemoglobin: 12 g/dL (ref 12.0–15.0)
Immature Granulocytes: 0 %
Lymphocytes Relative: 30 %
Lymphs Abs: 1.6 10*3/uL (ref 0.7–4.0)
MCH: 26.4 pg (ref 26.0–34.0)
MCHC: 31.8 g/dL (ref 30.0–36.0)
MCV: 83 fL (ref 80.0–100.0)
Monocytes Absolute: 0.4 10*3/uL (ref 0.1–1.0)
Monocytes Relative: 7 %
Neutro Abs: 3.2 10*3/uL (ref 1.7–7.7)
Neutrophils Relative %: 60 %
Platelet Count: 277 10*3/uL (ref 150–400)
RBC: 4.54 MIL/uL (ref 3.87–5.11)
RDW: 14.7 % (ref 11.5–15.5)
WBC Count: 5.3 10*3/uL (ref 4.0–10.5)
nRBC: 0 % (ref 0.0–0.2)

## 2022-09-14 LAB — RETICULOCYTES
Immature Retic Fract: 9.5 % (ref 2.3–15.9)
RBC.: 4.55 MIL/uL (ref 3.87–5.11)
Retic Count, Absolute: 60.5 10*3/uL (ref 19.0–186.0)
Retic Ct Pct: 1.3 % (ref 0.4–3.1)

## 2022-09-14 LAB — IRON AND IRON BINDING CAPACITY (CC-WL,HP ONLY)
Iron: 49 ug/dL (ref 28–170)
Saturation Ratios: 13 % (ref 10.4–31.8)
TIBC: 368 ug/dL (ref 250–450)
UIBC: 319 ug/dL (ref 148–442)

## 2022-09-14 LAB — FERRITIN: Ferritin: 12 ng/mL (ref 11–307)

## 2022-09-14 NOTE — Progress Notes (Signed)
Hematology and Oncology Follow Up Visit  Michelle Pacheco [id number] 25-Jun-1985 37 y.o. 09/14/2022   Principle Diagnosis:  Iron deficiency anemia secondary to heavy cycles and intermittent GI blood loss/malabsorption with Crohn's    Current Therapy:        IV iron as indicated    Interim History:  Michelle Pacheco is here today with her husband for follow-up. She has had a difficult time since having her D&C 2 weeks ago. She developed an infection post procedure and then BV/yeast infection with antibiotics.  She is still having a little vaginal spotting. No other blood loss noted. No bruising or petechiae.  She has also had flares with her Crohn's recently with constipation vs diarrhea. She was prescribed a prednisone taper starting at 40 mg PO daily but was afraid of the dose. She plans to discuss this along with her persistent yeast infection symptoms with her gynecologist later today.  She is symptomatic with chills, fatigue, weakness, dizziness and SOB with any exertion.  No fever, n/v, cough, rash, chest pain and palpitations.  No swelling in her extremities at this time.  No falls or syncope reported.  Appetite and hydration are good. Weight is 212 lbs.   ECOG Performance Status: 1 - Symptomatic but completely ambulatory  Medications:  Allergies as of 09/14/2022       Reactions   Bioflavonoids Hives   Latex Hives   Peanuts [peanut Oil] Swelling   Citrus Hives   Shellfish Allergy    Hives, sob        Medication List        Accurate as of September 14, 2022  8:58 AM. If you have any questions, ask your nurse or doctor.          Acetaminophen Extra Strength 500 MG Tabs Commonly known as: TYLENOL Take 2 tablets (1,000 mg total) by mouth every 8 (eight) hours as needed for mild pain or moderate pain.   cetirizine 10 MG tablet Commonly known as: ZyrTEC Allergy Take 1 tablet (10 mg total) by mouth 2 (two) times daily.   EPINEPHrine 0.3 mg/0.3 mL Soaj injection Commonly  known as: EPI-PEN Use as directed as needed for systemic reactions.   escitalopram 10 MG tablet Commonly known as: LEXAPRO Take 1/2 tablet once a day for 1 week and then take a full tablet once daily.   Fluarix Quadrivalent 0.5 ML injection Generic drug: influenza vac split quadrivalent PF Inject into the muscle.   fluconazole 200 MG tablet Commonly known as: DIFLUCAN Take 1 tablet (200 mg total) by mouth every three (3) days as needed.   mesalamine 0.375 g 24 hr capsule Commonly known as: APRISO Take 4 capsules (1.5 g total) by mouth in the morning.   mesalamine 0.375 g 24 hr capsule Commonly known as: APRISO Take 4 capsules (1.5 g total) by mouth in the morning.   metroNIDAZOLE 500 MG tablet Commonly known as: FLAGYL Take 1 tablet (500 mg total) by mouth 2 (two) times daily, avoid alcohol.   Olopatadine HCl 0.2 % Soln Place 1 drop into both eyes daily as needed.   oxyCODONE 5 MG immediate release tablet Commonly known as: Oxy IR/ROXICODONE Take 1 tablet (5 mg total) by mouth every 6 (six) hours as needed for severe pain.   polyethylene glycol powder 17 GM/SCOOP powder Commonly known as: GLYCOLAX/MIRALAX See admin instructions.   predniSONE 10 MG tablet Commonly known as: DELTASONE Take 4 tablets (40 mg total) by mouth daily for 7 days, THEN  3 tablets (30 mg total) daily for 7 days, THEN 2 tablets (20 mg total) daily for 7 days, THEN 1 tablet (10 mg total) daily for 7 days, THEN 0.5 tablets (5 mg total) daily for 7 days. Start taking on: September 06, 2022   triamcinolone ointment 0.1 % Commonly known as: KENALOG Apply 1 application on to the skin 2 times daily or as needed to the body only   triamcinolone ointment 0.1 % Commonly known as: KENALOG Apply 1 Application topically 2 (two) times daily as needed to body only.        Allergies:  Allergies  Allergen Reactions   Bioflavonoids Hives   Latex Hives   Peanuts [Peanut Oil] Swelling   Citrus Hives    Shellfish Allergy     Hives, sob    Past Medical History, Surgical history, Social history, and Family History were reviewed and updated.  Review of Systems: All other 10 point review of systems is negative.   Physical Exam:  vitals were not taken for this visit.   Wt Readings from Last 3 Encounters:  08/31/22 217 lb (98.4 kg)  09/02/21 220 lb 1.9 oz (99.8 kg)  04/28/21 230 lb 1.9 oz (104.4 kg)    Ocular: Sclerae unicteric, pupils equal, round and reactive to light Ear-nose-throat: Oropharynx clear, dentition fair Lymphatic: No cervical or supraclavicular adenopathy Lungs no rales or rhonchi, good excursion bilaterally Heart regular rate and rhythm, no murmur appreciated Abd soft, nontender, positive bowel sounds MSK no focal spinal tenderness, no joint edema Neuro: non-focal, well-oriented, appropriate affect Breasts: Deferred   Lab Results  Component Value Date   WBC 5.3 09/14/2022   HGB 12.0 09/14/2022   HCT 37.7 09/14/2022   MCV 83.0 09/14/2022   PLT 277 09/14/2022   Lab Results  Component Value Date   FERRITIN 12 08/24/2022   IRON 74 08/24/2022   TIBC 435 08/24/2022   UIBC 361 08/24/2022   IRONPCTSAT 17 08/24/2022   Lab Results  Component Value Date   RETICCTPCT 1.3 09/14/2022   RBC 4.54 09/14/2022   RBC 4.55 09/14/2022   No results found for: "KPAFRELGTCHN", "LAMBDASER", "KAPLAMBRATIO" No results found for: "IGGSERUM", "IGA", "IGMSERUM" No results found for: "TOTALPROTELP", "ALBUMINELP", "A1GS", "A2GS", "BETS", "BETA2SER", "GAMS", "MSPIKE", "SPEI"   Chemistry      Component Value Date/Time   NA 138 06/30/2020 0855   NA 140 06/19/2018 0813   K 3.9 06/30/2020 0855   CL 103 06/30/2020 0855   CO2 28 06/30/2020 0855   BUN 14 06/30/2020 0855   BUN 13 06/19/2018 0813   CREATININE 0.88 06/30/2020 0855      Component Value Date/Time   CALCIUM 9.8 06/30/2020 0855   ALKPHOS 64 06/30/2020 0855   AST 20 06/30/2020 0855   ALT 21 06/30/2020 0855   BILITOT  0.3 06/30/2020 0855       Impression and Plan: Michelle Pacheco is a very pleasant 37 yo African American female with history of iron deficiency anemia secondary to heavy cycles and intermittent GI blood loss/malabsorption with Crohn's. Iron studies are pending.  Follow-up in 3 months.   Eileen Stanford, NP 7/31/20248:58 AM

## 2022-09-15 ENCOUNTER — Other Ambulatory Visit (HOSPITAL_BASED_OUTPATIENT_CLINIC_OR_DEPARTMENT_OTHER): Payer: Self-pay

## 2022-09-15 MED ORDER — SULFAMETHOXAZOLE-TRIMETHOPRIM 800-160 MG PO TABS
1.0000 | ORAL_TABLET | Freq: Two times a day (BID) | ORAL | 0 refills | Status: DC
  Filled 2022-09-15: qty 6, 3d supply, fill #0

## 2022-09-19 ENCOUNTER — Other Ambulatory Visit (HOSPITAL_BASED_OUTPATIENT_CLINIC_OR_DEPARTMENT_OTHER): Payer: Self-pay

## 2022-09-19 DIAGNOSIS — K50111 Crohn's disease of large intestine with rectal bleeding: Secondary | ICD-10-CM | POA: Diagnosis not present

## 2022-09-19 MED ORDER — SULFAMETHOXAZOLE-TRIMETHOPRIM 800-160 MG PO TABS
1.0000 | ORAL_TABLET | Freq: Two times a day (BID) | ORAL | 0 refills | Status: DC
  Filled 2022-09-19: qty 14, 7d supply, fill #0

## 2022-09-19 MED ORDER — SILVER SULFADIAZINE 1 % EX CREA
1.0000 | TOPICAL_CREAM | Freq: Two times a day (BID) | CUTANEOUS | 1 refills | Status: DC
  Filled 2022-09-19: qty 50, 25d supply, fill #0

## 2022-10-03 ENCOUNTER — Encounter: Payer: Self-pay | Admitting: Family

## 2022-10-03 ENCOUNTER — Encounter: Payer: Self-pay | Admitting: *Deleted

## 2022-10-04 ENCOUNTER — Telehealth: Payer: Self-pay

## 2022-10-04 NOTE — Telephone Encounter (Signed)
Clinical Social Work was referred by Charity fundraiser for assessment of psychosocial needs.  CSW attempted to contact patient by phone.  Left voicemail with contact information and request for return call.

## 2022-10-05 ENCOUNTER — Telehealth: Payer: Self-pay

## 2022-10-05 NOTE — Telephone Encounter (Signed)
Patient returned CSW call.  Phoned her back and left a vm with contact information.

## 2022-10-06 ENCOUNTER — Inpatient Hospital Stay: Payer: 59 | Attending: Hematology & Oncology

## 2022-10-06 NOTE — Progress Notes (Signed)
[facility] Clinical Social Work  Initial Assessment   Michelle Pacheco is a 37 y.o. year old female contacted by phone. Clinical Social Work was referred by medical provider for assessment of psychosocial needs.   SDOH (Social Determinants of Health) assessments performed: Yes SDOH Interventions    Flowsheet Row Clinical Support from 10/06/2022 in [facility] at [facility]  SDOH Interventions   Food Insecurity Interventions Intervention Not Indicated  Housing Interventions [id number] Referral  Transportation Interventions Intervention Not Indicated  Utilities Interventions Intervention Not Indicated  Financial Strain Interventions [id number] Referral  Health Literacy Interventions Intervention Not Indicated       SDOH Screenings   Food Insecurity: No Food Insecurity (10/06/2022)  Housing: Medium Risk (10/06/2022)  Transportation Needs: No Transportation Needs (10/06/2022)  Utilities: Not At Risk (10/06/2022)  Depression (PHQ2-9): Low Risk  (04/24/2020)  Financial Resource Strain: High Risk (10/06/2022)  Physical Activity: Insufficiently Active (12/25/2017)  Social Connections: Unknown (09/06/2022)   Received from [facility]  Stress: No Stress Concern Present (12/25/2017)  Tobacco Use: Medium Risk (09/14/2022)  Health Literacy: Adequate Health Literacy (10/06/2022)     Distress Screen completed: No     No data to display            Family/Social Information:  Housing Arrangement: patient lives with her husband and four children. Family members/support persons in your life? Family and Friends Transportation concerns: no  Employment: Out on work excuse.  Patient works for [employer]. Income source: Supported by [employer] and Friends Financial concerns: Yes, current concerns Type of concern: Biomedical scientist access concerns: no Religious or spiritual practice: Yes-Patient identifies as [employer]. Services Currently in place:  [employer].  Coping/  Adjustment to diagnosis: Patient understands treatment plan and what happens next? yes Concerns about diagnosis and/or treatment:  Patient has anemia. Patient reported stressors: Psychiatric nurse and/or priorities: Family Patient enjoys time with family/ friends Current coping skills/ strengths: Average or above average intelligence , Capable of independent living , Manufacturing systems engineer , General fund of knowledge , and Supportive family/friends     SUMMARY: Current SDOH Barriers:  Financial constraints related to limited income.  Clinical Social Work Clinical Goal(s):  Explore community resource options for unmet needs related to:  Scientist, clinical (histocompatibility and immunogenetics) Strain   Interventions: Discussed common feeling and emotions when being diagnosed with cancer, and the importance of support during treatment Informed patient of the support team roles and support services at [facility] Provided CSW contact information and encouraged patient to call with any questions or concerns Provided patient with information about Glass blower/designer and [employer] [facility] [employer].   Follow Up Plan: Patient will contact CSW with any support or resource needs Patient verbalizes understanding of plan: Yes    Dorothey Baseman, LCSW Clinical Social Worker [facility]  Patient is participating in a Managed Medicaid Plan:  Yes

## 2022-10-07 ENCOUNTER — Other Ambulatory Visit (HOSPITAL_BASED_OUTPATIENT_CLINIC_OR_DEPARTMENT_OTHER): Payer: Self-pay

## 2022-10-07 DIAGNOSIS — Z9889 Other specified postprocedural states: Secondary | ICD-10-CM | POA: Diagnosis not present

## 2022-10-07 DIAGNOSIS — R102 Pelvic and perineal pain: Secondary | ICD-10-CM | POA: Diagnosis not present

## 2022-10-07 DIAGNOSIS — Z4889 Encounter for other specified surgical aftercare: Secondary | ICD-10-CM | POA: Diagnosis not present

## 2022-10-07 DIAGNOSIS — N898 Other specified noninflammatory disorders of vagina: Secondary | ICD-10-CM | POA: Diagnosis not present

## 2022-10-07 DIAGNOSIS — N72 Inflammatory disease of cervix uteri: Secondary | ICD-10-CM | POA: Diagnosis not present

## 2022-10-07 DIAGNOSIS — K50111 Crohn's disease of large intestine with rectal bleeding: Secondary | ICD-10-CM | POA: Diagnosis not present

## 2022-10-07 DIAGNOSIS — N939 Abnormal uterine and vaginal bleeding, unspecified: Secondary | ICD-10-CM | POA: Diagnosis not present

## 2022-10-07 MED ORDER — MEDROXYPROGESTERONE ACETATE 10 MG PO TABS
10.0000 mg | ORAL_TABLET | Freq: Every day | ORAL | 0 refills | Status: DC
  Filled 2022-10-07 – 2022-10-21 (×2): qty 30, 30d supply, fill #0

## 2022-10-19 ENCOUNTER — Other Ambulatory Visit (HOSPITAL_BASED_OUTPATIENT_CLINIC_OR_DEPARTMENT_OTHER): Payer: Self-pay

## 2022-10-21 ENCOUNTER — Other Ambulatory Visit (HOSPITAL_BASED_OUTPATIENT_CLINIC_OR_DEPARTMENT_OTHER): Payer: Self-pay

## 2022-11-10 DIAGNOSIS — N72 Inflammatory disease of cervix uteri: Secondary | ICD-10-CM | POA: Diagnosis not present

## 2022-11-10 DIAGNOSIS — N898 Other specified noninflammatory disorders of vagina: Secondary | ICD-10-CM | POA: Diagnosis not present

## 2022-11-10 DIAGNOSIS — N939 Abnormal uterine and vaginal bleeding, unspecified: Secondary | ICD-10-CM | POA: Diagnosis not present

## 2022-11-10 DIAGNOSIS — N760 Acute vaginitis: Secondary | ICD-10-CM | POA: Diagnosis not present

## 2022-11-10 DIAGNOSIS — Z9889 Other specified postprocedural states: Secondary | ICD-10-CM | POA: Diagnosis not present

## 2022-11-10 DIAGNOSIS — K50111 Crohn's disease of large intestine with rectal bleeding: Secondary | ICD-10-CM | POA: Diagnosis not present

## 2022-12-02 ENCOUNTER — Encounter: Payer: Self-pay | Admitting: Family

## 2022-12-02 ENCOUNTER — Other Ambulatory Visit (HOSPITAL_BASED_OUTPATIENT_CLINIC_OR_DEPARTMENT_OTHER): Payer: Self-pay

## 2022-12-02 MED ORDER — INFLUENZA VIRUS VACC SPLIT PF (FLUZONE) 0.5 ML IM SUSY
0.5000 mL | PREFILLED_SYRINGE | Freq: Once | INTRAMUSCULAR | 0 refills | Status: AC
  Filled 2022-12-02: qty 0.5, 1d supply, fill #0

## 2022-12-15 ENCOUNTER — Other Ambulatory Visit: Payer: 59

## 2022-12-15 ENCOUNTER — Ambulatory Visit: Payer: 59 | Admitting: Medical Oncology

## 2023-01-06 DIAGNOSIS — E282 Polycystic ovarian syndrome: Secondary | ICD-10-CM | POA: Diagnosis not present

## 2023-01-06 DIAGNOSIS — Z0000 Encounter for general adult medical examination without abnormal findings: Secondary | ICD-10-CM | POA: Diagnosis not present

## 2023-01-06 DIAGNOSIS — D509 Iron deficiency anemia, unspecified: Secondary | ICD-10-CM | POA: Diagnosis not present

## 2023-01-06 DIAGNOSIS — Z8719 Personal history of other diseases of the digestive system: Secondary | ICD-10-CM | POA: Diagnosis not present

## 2023-01-06 DIAGNOSIS — Z13220 Encounter for screening for lipoid disorders: Secondary | ICD-10-CM | POA: Diagnosis not present

## 2023-01-06 DIAGNOSIS — F419 Anxiety disorder, unspecified: Secondary | ICD-10-CM | POA: Diagnosis not present

## 2023-01-06 DIAGNOSIS — K5900 Constipation, unspecified: Secondary | ICD-10-CM | POA: Diagnosis not present

## 2023-01-06 DIAGNOSIS — K649 Unspecified hemorrhoids: Secondary | ICD-10-CM | POA: Diagnosis not present

## 2023-01-06 DIAGNOSIS — K5010 Crohn's disease of large intestine without complications: Secondary | ICD-10-CM | POA: Diagnosis not present

## 2023-01-06 DIAGNOSIS — J302 Other seasonal allergic rhinitis: Secondary | ICD-10-CM | POA: Diagnosis not present

## 2023-01-06 DIAGNOSIS — N939 Abnormal uterine and vaginal bleeding, unspecified: Secondary | ICD-10-CM | POA: Diagnosis not present

## 2023-02-02 DIAGNOSIS — N939 Abnormal uterine and vaginal bleeding, unspecified: Secondary | ICD-10-CM | POA: Diagnosis not present

## 2023-02-02 DIAGNOSIS — G44009 Cluster headache syndrome, unspecified, not intractable: Secondary | ICD-10-CM | POA: Diagnosis not present

## 2023-02-22 ENCOUNTER — Inpatient Hospital Stay (HOSPITAL_BASED_OUTPATIENT_CLINIC_OR_DEPARTMENT_OTHER): Payer: 59 | Admitting: Family

## 2023-02-22 ENCOUNTER — Encounter: Payer: Self-pay | Admitting: Family

## 2023-02-22 ENCOUNTER — Inpatient Hospital Stay: Payer: 59 | Attending: Hematology & Oncology

## 2023-02-22 VITALS — BP 129/71 | HR 75 | Temp 99.0°F | Resp 18 | Ht 64.0 in | Wt 218.0 lb

## 2023-02-22 DIAGNOSIS — K922 Gastrointestinal hemorrhage, unspecified: Secondary | ICD-10-CM | POA: Insufficient documentation

## 2023-02-22 DIAGNOSIS — D500 Iron deficiency anemia secondary to blood loss (chronic): Secondary | ICD-10-CM | POA: Diagnosis not present

## 2023-02-22 DIAGNOSIS — K909 Intestinal malabsorption, unspecified: Secondary | ICD-10-CM | POA: Diagnosis not present

## 2023-02-22 DIAGNOSIS — N920 Excessive and frequent menstruation with regular cycle: Secondary | ICD-10-CM | POA: Insufficient documentation

## 2023-02-22 LAB — RETICULOCYTES
Immature Retic Fract: 9.3 % (ref 2.3–15.9)
RBC.: 4.74 MIL/uL (ref 3.87–5.11)
Retic Count, Absolute: 52.1 10*3/uL (ref 19.0–186.0)
Retic Ct Pct: 1.1 % (ref 0.4–3.1)

## 2023-02-22 LAB — CBC WITH DIFFERENTIAL (CANCER CENTER ONLY)
Abs Immature Granulocytes: 0.04 10*3/uL (ref 0.00–0.07)
Basophils Absolute: 0 10*3/uL (ref 0.0–0.1)
Basophils Relative: 1 %
Eosinophils Absolute: 0.1 10*3/uL (ref 0.0–0.5)
Eosinophils Relative: 2 %
HCT: 39.5 % (ref 36.0–46.0)
Hemoglobin: 12.7 g/dL (ref 12.0–15.0)
Immature Granulocytes: 1 %
Lymphocytes Relative: 31 %
Lymphs Abs: 1.5 10*3/uL (ref 0.7–4.0)
MCH: 26.6 pg (ref 26.0–34.0)
MCHC: 32.2 g/dL (ref 30.0–36.0)
MCV: 82.6 fL (ref 80.0–100.0)
Monocytes Absolute: 0.3 10*3/uL (ref 0.1–1.0)
Monocytes Relative: 6 %
Neutro Abs: 3 10*3/uL (ref 1.7–7.7)
Neutrophils Relative %: 59 %
Platelet Count: 236 10*3/uL (ref 150–400)
RBC: 4.78 MIL/uL (ref 3.87–5.11)
RDW: 14.2 % (ref 11.5–15.5)
WBC Count: 5 10*3/uL (ref 4.0–10.5)
nRBC: 0 % (ref 0.0–0.2)

## 2023-02-22 LAB — IRON AND IRON BINDING CAPACITY (CC-WL,HP ONLY)
Iron: 78 ug/dL (ref 28–170)
Saturation Ratios: 19 % (ref 10.4–31.8)
TIBC: 420 ug/dL (ref 250–450)
UIBC: 342 ug/dL (ref 148–442)

## 2023-02-22 LAB — FERRITIN: Ferritin: 22 ng/mL (ref 11–307)

## 2023-02-22 NOTE — Progress Notes (Signed)
 Hematology and Oncology Follow Up Visit  Michelle Pacheco [id number] [birth year] 38 y.o. 02/22/2023   Principle Diagnosis:  ron deficiency anemia secondary to heavy cycles and intermittent GI blood loss/malabsorption with Crohn's    Current Therapy:        IV iron as indicated         Interim History:  Michelle Pacheco is here today for follow-up. She is feeling fatigued and notes dizziness and mild SOB with over exertion.  She is getting over an upper respiratory infection. She still has a cough but sputum the last day or so has been clear. Chest ache with cough.  No fever, chills, n/v, rash, palpitations, abdominal pain or changes in bladder habits.  She has been having a Crohn's flare with diarrhea and now constipation. She denies any blood loss.  No petechiae or abnormal bruising.  No swelling or tenderness in her extremities at this time.  No falls or syncope reported.  Appetite is good but she admits that she needs to better hydrated throughout the day. Weight is stable at 218 lbs.   ECOG Performance Status: 1 - Symptomatic but completely ambulatory  Medications:  Allergies as of 02/22/2023       Reactions   Bioflavonoids Hives   Latex Hives   Peanuts [peanut Oil] Swelling   Shellfish Allergy Hives, Shortness Of Breath   Citrus Hives        Medication List        Accurate as of February 22, 2023  8:41 AM. If you have any questions, ask your nurse or doctor.          STOP taking these medications    Acetaminophen  Extra Strength 500 MG Tabs Commonly known as: TYLENOL  Stopped by: Lauraine Pepper   escitalopram  10 MG tablet Commonly known as: LEXAPRO  Stopped by: Lauraine Pepper   Fluarix  Quadrivalent 0.5 ML injection Generic drug: influenza vac split quadrivalent PF Stopped by: Lauraine Pepper   fluconazole  200 MG tablet Commonly known as: DIFLUCAN  Stopped by: Lauraine Pepper   medroxyPROGESTERone  10 MG tablet Commonly known as: PROVERA  Stopped by: Lauraine Pepper        TAKE these medications    cetirizine  10 MG tablet Commonly known as: ZyrTEC  Allergy Take 1 tablet (10 mg total) by mouth 2 (two) times daily.   EPINEPHrine  0.3 mg/0.3 mL Soaj injection Commonly known as: EPI-PEN Use as directed as needed for systemic reactions.   mesalamine  0.375 g 24 hr capsule Commonly known as: APRISO  Take 4 capsules (1.5 g total) by mouth in the morning.   mesalamine  0.375 g 24 hr capsule Commonly known as: APRISO  Take 4 capsules (1.5 g total) by mouth in the morning.   metroNIDAZOLE  500 MG tablet Commonly known as: FLAGYL  Take 1 tablet (500 mg total) by mouth 2 (two) times daily, avoid alcohol.   Olopatadine  HCl 0.2 % Soln Place 1 drop into both eyes daily as needed.   oxyCODONE  5 MG immediate release tablet Commonly known as: Oxy IR/ROXICODONE  Take 1 tablet (5 mg total) by mouth every 6 (six) hours as needed for severe pain.   polyethylene glycol powder 17 GM/SCOOP powder Commonly known as: GLYCOLAX/MIRALAX See admin instructions.   SSD 1 % cream Generic drug: silver  sulfADIAZINE  Apply to affected area as directed twice daily.   sulfamethoxazole -trimethoprim  800-160 MG tablet Commonly known as: BACTRIM  DS Take 1 tablet by mouth 2 (two) times daily for 7 days.   triamcinolone  ointment 0.1 % Commonly known as: KENALOG  Apply 1  application on to the skin 2 times daily or as needed to the body only   triamcinolone  ointment 0.1 % Commonly known as: KENALOG  Apply 1 Application topically 2 (two) times daily as needed to body only.        Allergies:  Allergies  Allergen Reactions   Bioflavonoids Hives   Latex Hives   Peanuts [Peanut Oil] Swelling   Shellfish Allergy Hives and Shortness Of Breath   Citrus Hives    Past Medical History, Surgical history, Social history, and Family History were reviewed and updated.  Review of Systems: All other 10 point review of systems is negative.   Physical Exam:  vitals were not taken for this  visit.   Wt Readings from Last 3 Encounters:  09/14/22 212 lb 0.6 oz (96.2 kg)  08/31/22 217 lb (98.4 kg)  09/02/21 220 lb 1.9 oz (99.8 kg)    Ocular: Sclerae unicteric, pupils equal, round and reactive to light Ear-nose-throat: Oropharynx clear, dentition fair Lymphatic: No cervical or supraclavicular adenopathy Lungs no rales or rhonchi, good excursion bilaterally Heart regular rate and rhythm, no murmur appreciated Abd soft, nontender, positive bowel sounds MSK no focal spinal tenderness, no joint edema Neuro: non-focal, well-oriented, appropriate affect Breasts: Deferred   Lab Results  Component Value Date   WBC 5.0 02/22/2023   HGB 12.7 02/22/2023   HCT 39.5 02/22/2023   MCV 82.6 02/22/2023   PLT 236 02/22/2023   Lab Results  Component Value Date   FERRITIN 12 09/14/2022   IRON 49 09/14/2022   TIBC 368 09/14/2022   UIBC 319 09/14/2022   IRONPCTSAT 13 09/14/2022   Lab Results  Component Value Date   RETICCTPCT 1.1 02/22/2023   RBC 4.74 02/22/2023   RBC 4.78 02/22/2023   No results found for: KPAFRELGTCHN, LAMBDASER, KAPLAMBRATIO No results found for: IGGSERUM, IGA, IGMSERUM No results found for: STEPHANY CARLOTA BENSON MARKEL EARLA JOANNIE DOC VICK, SPEI   Chemistry      Component Value Date/Time   NA 138 06/30/2020 0855   NA 140 06/19/2018 0813   K 3.9 06/30/2020 0855   CL 103 06/30/2020 0855   CO2 28 06/30/2020 0855   BUN 14 06/30/2020 0855   BUN 13 06/19/2018 0813   CREATININE 0.88 06/30/2020 0855      Component Value Date/Time   CALCIUM 9.8 06/30/2020 0855   ALKPHOS 64 06/30/2020 0855   AST 20 06/30/2020 0855   ALT 21 06/30/2020 0855   BILITOT 0.3 06/30/2020 0855       Impression and Plan: Michelle Pacheco is a very pleasant 38 yo African American female with history of iron deficiency anemia secondary to heavy cycles and intermittent GI blood loss/malabsorption with Crohn's. Iron studies are pending.   Follow-up in 4 months.   Lauraine Pepper, NP 1/8/20258:41 AM

## 2023-03-16 ENCOUNTER — Encounter: Payer: Self-pay | Admitting: Family

## 2023-03-16 ENCOUNTER — Other Ambulatory Visit (HOSPITAL_BASED_OUTPATIENT_CLINIC_OR_DEPARTMENT_OTHER): Payer: Self-pay

## 2023-03-22 ENCOUNTER — Telehealth: Payer: 59 | Admitting: Physician Assistant

## 2023-03-22 ENCOUNTER — Other Ambulatory Visit (HOSPITAL_BASED_OUTPATIENT_CLINIC_OR_DEPARTMENT_OTHER): Payer: Self-pay

## 2023-03-22 DIAGNOSIS — J069 Acute upper respiratory infection, unspecified: Secondary | ICD-10-CM | POA: Diagnosis not present

## 2023-03-22 MED ORDER — FLUTICASONE PROPIONATE 50 MCG/ACT NA SUSP
2.0000 | Freq: Every day | NASAL | 0 refills | Status: AC
Start: 2023-03-22 — End: ?
  Filled 2023-03-22: qty 16, 30d supply, fill #0

## 2023-03-22 MED ORDER — BENZONATATE 100 MG PO CAPS
100.0000 mg | ORAL_CAPSULE | Freq: Three times a day (TID) | ORAL | 0 refills | Status: AC | PRN
Start: 2023-03-22 — End: ?
  Filled 2023-03-22: qty 30, 10d supply, fill #0

## 2023-03-22 NOTE — Progress Notes (Signed)

## 2023-03-22 NOTE — Progress Notes (Signed)
 I have spent 5 minutes in review of e-visit questionnaire, review and updating patient chart, medical decision making and response to patient.   Piedad Climes, PA-C

## 2023-04-06 ENCOUNTER — Ambulatory Visit: Payer: 59 | Admitting: Dietician

## 2023-04-20 ENCOUNTER — Encounter: Payer: 59 | Attending: Internal Medicine | Admitting: Dietician

## 2023-04-20 DIAGNOSIS — Z713 Dietary counseling and surveillance: Secondary | ICD-10-CM | POA: Insufficient documentation

## 2023-04-20 NOTE — Progress Notes (Signed)
 Medical Nutrition Therapy  Appointment Start time:  49  Appointment End time:  1620 Patient was last seen in the office 09/08/2021  Primary concerns today: Pt states she is eating on the go and looking for advice on how to be consistent with developing structured habits wants balance and lifestyle change.   She states that she struggles with consistency. She states that she uses sugar (soda) to help with stress.  Referral diagnosis: none- [employer] Employee Preferred learning style: no preference indicated Learning readiness: contemplating   NUTRITION ASSESSMENT   221 lbs UBW 218 lbs for the past 3 years  Clinical Medical Hx: crohn's (constipation or bloating), PCOS, anemia Medications: mesalamine Labs: reviewed Notable Signs/Symptoms: fatigue  Lifestyle & Dietary Hx Patient lives with her husband and 4 children.  They share shopping and her husband does most of the cooking. 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 2 family members have passed in 2025. Tree Nut and Avocado allergy. Latex allergy  Estimated daily fluid intake:  oz Supplements: MVI, probiotic (inconsistent due to cost) Sleep: 6 hours  Stress / self-care: high stress Current average weekly physical activity: none  24-Hr Dietary Recall Forgets to eat or drink when she is so busy. First Meal 10:30 am: tuna on sourdough bread Snack: none Second Meal: cookout - 2 fried chicken strips, fries, 4 hushpuppies Snack: Belvita cookies Third Meal: fried chicken, asparagus, rice Snack: none usually or handful of m&m's Beverages: 14 oz water, 1 regular soda per day, herbal tea with sugar, coffee with sweetened creamer  NUTRITION DIAGNOSIS  NB-1.1 Food and nutrition-related knowledge deficit As related to balance of carbohydrate, protein, and fat.  As evidenced by diet hx and patient report.   NUTRITION INTERVENTION  Nutrition education (E-1) on the following topics:  Beverage  choices Exercise/movement Sleep/stress control Mindfulness Fat intake and crohn's  Handouts Provided Include  none  Learning Style & Readiness for Change Teaching method utilized: Visual & Auditory  Demonstrated degree of understanding via: Teach Back  Barriers to learning/adherence to lifestyle change: time  Goals Established by Pt Continued mindfulness Beverage choice Stress control/exercise  Beverages:  Things to consider Increasing Water Spindrift or other seltzer water Herbal tea with less sugar Avoid artificial sweeteners  Less caffeine to help sleep Consider going for a walk while your husband is eating dinner. Get to bed on time - Consider no screen time for 1 hour before bed  Consider keeping a notebook by your bed and write bullet points Day planner Boundaries  Consider Mindfulness:  Consistently scheduled meal - avoid skipping  Choices  Eat slowly  Away from distraction (sitting in kitchen or dining room)  Stop eating when satisfied  Before a snack ask, "Am I hungry or eating for another reason?"   "What can I do instead if I am not hungry?"  Try to find something every day that brings you joy!  MONITORING & EVALUATION Dietary intake, weekly physical activity, and label reading in 2-3 months.  Next Steps  Patient is to call for questions.

## 2023-04-20 NOTE — Patient Instructions (Addendum)
 Beverages:  Things to consider Increasing Water Spindrift or other seltzer water Herbal tea with less sugar Avoid artificial sweeteners  Less caffeine to help sleep Consider going for a walk while your husband is eating dinner. Get to bed on time - Consider no screen time for 1 hour before bed  Consider keeping a notebook by your bed and write bullet points Day planner Boundaries  Consider Mindfulness:  Consistently scheduled meal - avoid skipping  Choices  Eat slowly  Away from distraction (sitting in kitchen or dining room)  Stop eating when satisfied  Before a snack ask, "Am I hungry or eating for another reason?"   "What can I do instead if I am not hungry?"  Try to find something every day that brings you joy!

## 2023-06-15 ENCOUNTER — Ambulatory Visit: Admitting: Dietician

## 2023-06-21 ENCOUNTER — Other Ambulatory Visit (HOSPITAL_BASED_OUTPATIENT_CLINIC_OR_DEPARTMENT_OTHER): Payer: Self-pay

## 2023-06-21 ENCOUNTER — Encounter: Payer: Self-pay | Admitting: Family

## 2023-06-21 ENCOUNTER — Inpatient Hospital Stay (HOSPITAL_BASED_OUTPATIENT_CLINIC_OR_DEPARTMENT_OTHER): Payer: 59 | Admitting: Family

## 2023-06-21 ENCOUNTER — Inpatient Hospital Stay: Payer: 59 | Attending: Medical Oncology

## 2023-06-21 VITALS — BP 115/71 | HR 80 | Temp 99.1°F | Resp 18 | Wt 229.1 lb

## 2023-06-21 DIAGNOSIS — K909 Intestinal malabsorption, unspecified: Secondary | ICD-10-CM | POA: Insufficient documentation

## 2023-06-21 DIAGNOSIS — D500 Iron deficiency anemia secondary to blood loss (chronic): Secondary | ICD-10-CM | POA: Diagnosis not present

## 2023-06-21 DIAGNOSIS — N920 Excessive and frequent menstruation with regular cycle: Secondary | ICD-10-CM | POA: Insufficient documentation

## 2023-06-21 LAB — IRON AND IRON BINDING CAPACITY (CC-WL,HP ONLY)
Iron: 58 ug/dL (ref 28–170)
Saturation Ratios: 15 % (ref 10.4–31.8)
TIBC: 400 ug/dL (ref 250–450)
UIBC: 342 ug/dL (ref 148–442)

## 2023-06-21 LAB — RETICULOCYTES
Immature Retic Fract: 12 % (ref 2.3–15.9)
RBC.: 4.56 MIL/uL (ref 3.87–5.11)
Retic Count, Absolute: 67.9 10*3/uL (ref 19.0–186.0)
Retic Ct Pct: 1.5 % (ref 0.4–3.1)

## 2023-06-21 LAB — CBC WITH DIFFERENTIAL (CANCER CENTER ONLY)
Abs Immature Granulocytes: 0.01 10*3/uL (ref 0.00–0.07)
Basophils Absolute: 0 10*3/uL (ref 0.0–0.1)
Basophils Relative: 1 %
Eosinophils Absolute: 0.3 10*3/uL (ref 0.0–0.5)
Eosinophils Relative: 6 %
HCT: 38.5 % (ref 36.0–46.0)
Hemoglobin: 12.5 g/dL (ref 12.0–15.0)
Immature Granulocytes: 0 %
Lymphocytes Relative: 35 %
Lymphs Abs: 1.5 10*3/uL (ref 0.7–4.0)
MCH: 27.2 pg (ref 26.0–34.0)
MCHC: 32.5 g/dL (ref 30.0–36.0)
MCV: 83.9 fL (ref 80.0–100.0)
Monocytes Absolute: 0.4 10*3/uL (ref 0.1–1.0)
Monocytes Relative: 10 %
Neutro Abs: 2 10*3/uL (ref 1.7–7.7)
Neutrophils Relative %: 48 %
Platelet Count: 288 10*3/uL (ref 150–400)
RBC: 4.59 MIL/uL (ref 3.87–5.11)
RDW: 15 % (ref 11.5–15.5)
WBC Count: 4.3 10*3/uL (ref 4.0–10.5)
nRBC: 0 % (ref 0.0–0.2)

## 2023-06-21 LAB — FERRITIN: Ferritin: 26 ng/mL (ref 11–307)

## 2023-06-21 NOTE — Progress Notes (Signed)
 Hematology and Oncology Follow Up Visit  Michelle Pacheco [id number] 07/07/1985 38 y.o. 06/21/2023   Principle Diagnosis:  Iron deficiency anemia secondary to heavy cycles and intermittent GI blood loss/malabsorption with Crohn's    Current Therapy:        IV iron as indicated       Interim History:  Michelle Pacheco is here today for follow-up. She is feeling fatigued and states that she has been under a lot of stress at work. She had a flare with her Crohn's last month and had a lot of bright red blood in her stools. No blood loss noted this month.  No abnormal bruising or petechiae.  No fever, chills, n/v, cough, rash, dizziness, SOB, chest pain, palpitations, abdominal pain or changes in bowel or bladder habits.  No numbness or tingling in her extremities.  She does feel that she is retaining fluid/bloating.  No falls or syncope reported.  Appetite and hydration are good. She is trying to avoid any food triggers for Crohn's.  She has an appointment tomorrow with a Wellness provider.  Weight is 229 lbs.   ECOG Performance Status: 1 - Symptomatic but completely ambulatory  Medications:  Allergies as of 06/21/2023       Reactions   Bioflavonoids Hives   Latex Hives   Peanuts [peanut Oil] Swelling   Shellfish Allergy Hives, Shortness Of Breath   Citrus Hives        Medication List        Accurate as of Jun 21, 2023  9:10 AM. If you have any questions, ask your nurse or doctor.          benzonatate  100 MG capsule Commonly known as: TESSALON  Take 1 capsule (100 mg total) by mouth 3 (three) times daily as needed for cough.   cetirizine  10 MG tablet Commonly known as: ZyrTEC  Allergy Take 1 tablet (10 mg total) by mouth 2 (two) times daily.   EPINEPHrine  0.3 mg/0.3 mL Soaj injection Commonly known as: EPI-PEN Use as directed as needed for systemic reactions.   fluticasone  50 MCG/ACT nasal spray Commonly known as: FLONASE  Place 2 sprays into both nostrils daily.    mesalamine  0.375 g 24 hr capsule Commonly known as: APRISO  Take 4 capsules (1.5 g total) by mouth in the morning.   polyethylene glycol powder 17 GM/SCOOP powder Commonly known as: GLYCOLAX/MIRALAX See admin instructions.   triamcinolone  ointment 0.1 % Commonly known as: KENALOG  Apply 1 Application topically 2 (two) times daily as needed to body only.        Allergies:  Allergies  Allergen Reactions   Bioflavonoids Hives   Latex Hives   Peanuts [Peanut Oil] Swelling   Shellfish Allergy Hives and Shortness Of Breath   Citrus Hives    Past Medical History, Surgical history, Social history, and Family History were reviewed and updated.  Review of Systems: All other 10 point review of systems is negative.   Physical Exam:  vitals were not taken for this visit.   Wt Readings from Last 3 Encounters:  02/22/23 218 lb (98.9 kg)  09/14/22 212 lb 0.6 oz (96.2 kg)  08/31/22 217 lb (98.4 kg)    Ocular: Sclerae unicteric, pupils equal, round and reactive to light Ear-nose-throat: Oropharynx clear, dentition fair Lymphatic: No cervical or supraclavicular adenopathy Lungs no rales or rhonchi, good excursion bilaterally Heart regular rate and rhythm, no murmur appreciated Abd soft, nontender, positive bowel sounds MSK no focal spinal tenderness, no joint edema Neuro: non-focal, well-oriented, appropriate  affect Breasts: Deferred   Lab Results  Component Value Date   WBC 4.3 06/21/2023   HGB 12.5 06/21/2023   HCT 38.5 06/21/2023   MCV 83.9 06/21/2023   PLT 288 06/21/2023   Lab Results  Component Value Date   FERRITIN 22 02/22/2023   IRON 78 02/22/2023   TIBC 420 02/22/2023   UIBC 342 02/22/2023   IRONPCTSAT 19 02/22/2023   Lab Results  Component Value Date   RETICCTPCT 1.5 06/21/2023   RBC 4.56 06/21/2023   RBC 4.59 06/21/2023   No results found for: "KPAFRELGTCHN", "LAMBDASER", "KAPLAMBRATIO" No results found for: "IGGSERUM", "IGA", "IGMSERUM" No results  found for: "TOTALPROTELP", "ALBUMINELP", "A1GS", "A2GS", "BETS", "BETA2SER", "GAMS", "MSPIKE", "SPEI"   Chemistry      Component Value Date/Time   NA 138 06/30/2020 0855   NA 140 06/19/2018 0813   K 3.9 06/30/2020 0855   CL 103 06/30/2020 0855   CO2 28 06/30/2020 0855   BUN 14 06/30/2020 0855   BUN 13 06/19/2018 0813   CREATININE 0.88 06/30/2020 0855      Component Value Date/Time   CALCIUM 9.8 06/30/2020 0855   ALKPHOS 64 06/30/2020 0855   AST 20 06/30/2020 0855   ALT 21 06/30/2020 0855   BILITOT 0.3 06/30/2020 0855       Impression and Plan: Michelle Pacheco is a very pleasant 38 yo African American female with history of iron deficiency anemia secondary to heavy cycles and intermittent GI blood loss/malabsorption with Crohn's. Iron studies are pending.  Follow-up in 6 months.   Kennard Pea, NP 5/7/20259:10 AM

## 2023-06-22 ENCOUNTER — Other Ambulatory Visit (HOSPITAL_BASED_OUTPATIENT_CLINIC_OR_DEPARTMENT_OTHER): Payer: Self-pay

## 2023-06-22 ENCOUNTER — Other Ambulatory Visit (HOSPITAL_COMMUNITY): Payer: Self-pay

## 2023-06-22 DIAGNOSIS — Z13220 Encounter for screening for lipoid disorders: Secondary | ICD-10-CM | POA: Diagnosis not present

## 2023-06-22 DIAGNOSIS — Z1331 Encounter for screening for depression: Secondary | ICD-10-CM | POA: Diagnosis not present

## 2023-06-22 DIAGNOSIS — E6609 Other obesity due to excess calories: Secondary | ICD-10-CM | POA: Diagnosis not present

## 2023-06-22 DIAGNOSIS — E8889 Other specified metabolic disorders: Secondary | ICD-10-CM | POA: Diagnosis not present

## 2023-06-22 DIAGNOSIS — E66812 Obesity, class 2: Secondary | ICD-10-CM | POA: Diagnosis not present

## 2023-06-22 DIAGNOSIS — D500 Iron deficiency anemia secondary to blood loss (chronic): Secondary | ICD-10-CM | POA: Diagnosis not present

## 2023-06-22 DIAGNOSIS — K5010 Crohn's disease of large intestine without complications: Secondary | ICD-10-CM | POA: Diagnosis not present

## 2023-06-22 DIAGNOSIS — Z6838 Body mass index (BMI) 38.0-38.9, adult: Secondary | ICD-10-CM | POA: Diagnosis not present

## 2023-06-22 DIAGNOSIS — Z131 Encounter for screening for diabetes mellitus: Secondary | ICD-10-CM | POA: Diagnosis not present

## 2023-06-22 MED ORDER — MESALAMINE ER 0.375 G PO CP24
1.5000 g | ORAL_CAPSULE | Freq: Every morning | ORAL | 0 refills | Status: DC
  Filled 2023-06-22 (×2): qty 120, 30d supply, fill #0

## 2023-06-23 ENCOUNTER — Other Ambulatory Visit: Payer: Self-pay

## 2023-06-29 ENCOUNTER — Other Ambulatory Visit (HOSPITAL_BASED_OUTPATIENT_CLINIC_OR_DEPARTMENT_OTHER): Payer: Self-pay

## 2023-06-29 ENCOUNTER — Encounter: Payer: Self-pay | Admitting: Family

## 2023-06-29 DIAGNOSIS — R21 Rash and other nonspecific skin eruption: Secondary | ICD-10-CM | POA: Diagnosis not present

## 2023-06-29 DIAGNOSIS — Z91013 Allergy to seafood: Secondary | ICD-10-CM | POA: Diagnosis not present

## 2023-06-29 DIAGNOSIS — J301 Allergic rhinitis due to pollen: Secondary | ICD-10-CM | POA: Diagnosis not present

## 2023-06-29 DIAGNOSIS — Z91018 Allergy to other foods: Secondary | ICD-10-CM | POA: Diagnosis not present

## 2023-06-29 MED ORDER — EPINEPHRINE 0.3 MG/0.3ML IJ SOAJ
0.3000 mg | INTRAMUSCULAR | 1 refills | Status: AC | PRN
Start: 2023-06-29 — End: ?
  Filled 2023-06-29: qty 2, 30d supply, fill #0

## 2023-06-29 MED ORDER — TRIAMCINOLONE ACETONIDE 0.1 % EX OINT
1.0000 | TOPICAL_OINTMENT | Freq: Two times a day (BID) | CUTANEOUS | 5 refills | Status: AC | PRN
Start: 2023-06-29 — End: ?
  Filled 2023-06-29: qty 60, 30d supply, fill #0

## 2023-06-29 MED ORDER — CETIRIZINE HCL 10 MG PO TABS
10.0000 mg | ORAL_TABLET | Freq: Two times a day (BID) | ORAL | 5 refills | Status: AC
  Filled 2023-06-29: qty 60, 30d supply, fill #0
  Filled 2023-10-20 (×2): qty 60, 30d supply, fill #1

## 2023-06-29 MED ORDER — OLOPATADINE HCL 0.2 % OP SOLN
1.0000 [drp] | Freq: Every day | OPHTHALMIC | 5 refills | Status: AC
  Filled 2023-06-29: qty 2.5, 30d supply, fill #0

## 2023-07-06 DIAGNOSIS — E6609 Other obesity due to excess calories: Secondary | ICD-10-CM | POA: Diagnosis not present

## 2023-07-06 DIAGNOSIS — E8889 Other specified metabolic disorders: Secondary | ICD-10-CM | POA: Diagnosis not present

## 2023-07-06 DIAGNOSIS — K5010 Crohn's disease of large intestine without complications: Secondary | ICD-10-CM | POA: Diagnosis not present

## 2023-07-06 DIAGNOSIS — E66812 Obesity, class 2: Secondary | ICD-10-CM | POA: Diagnosis not present

## 2023-07-06 DIAGNOSIS — D500 Iron deficiency anemia secondary to blood loss (chronic): Secondary | ICD-10-CM | POA: Diagnosis not present

## 2023-07-06 DIAGNOSIS — Z6838 Body mass index (BMI) 38.0-38.9, adult: Secondary | ICD-10-CM | POA: Diagnosis not present

## 2023-07-07 ENCOUNTER — Other Ambulatory Visit (HOSPITAL_BASED_OUTPATIENT_CLINIC_OR_DEPARTMENT_OTHER): Payer: Self-pay

## 2023-07-14 ENCOUNTER — Other Ambulatory Visit (HOSPITAL_BASED_OUTPATIENT_CLINIC_OR_DEPARTMENT_OTHER): Payer: Self-pay

## 2023-07-14 DIAGNOSIS — K50111 Crohn's disease of large intestine with rectal bleeding: Secondary | ICD-10-CM | POA: Diagnosis not present

## 2023-07-14 DIAGNOSIS — K5010 Crohn's disease of large intestine without complications: Secondary | ICD-10-CM | POA: Diagnosis not present

## 2023-07-14 MED ORDER — MESALAMINE ER 0.375 G PO CP24
1.5000 g | ORAL_CAPSULE | Freq: Every morning | ORAL | 3 refills | Status: AC
  Filled 2023-07-14 – 2023-07-19 (×2): qty 360, 90d supply, fill #0
  Filled 2023-10-20 (×2): qty 360, 90d supply, fill #1
  Filled 2024-01-18: qty 360, 90d supply, fill #2

## 2023-07-19 ENCOUNTER — Other Ambulatory Visit (HOSPITAL_BASED_OUTPATIENT_CLINIC_OR_DEPARTMENT_OTHER): Payer: Self-pay

## 2023-07-20 DIAGNOSIS — D500 Iron deficiency anemia secondary to blood loss (chronic): Secondary | ICD-10-CM | POA: Diagnosis not present

## 2023-07-20 DIAGNOSIS — K5010 Crohn's disease of large intestine without complications: Secondary | ICD-10-CM | POA: Diagnosis not present

## 2023-07-20 DIAGNOSIS — E6609 Other obesity due to excess calories: Secondary | ICD-10-CM | POA: Diagnosis not present

## 2023-07-20 DIAGNOSIS — Z6838 Body mass index (BMI) 38.0-38.9, adult: Secondary | ICD-10-CM | POA: Diagnosis not present

## 2023-07-20 DIAGNOSIS — E66812 Obesity, class 2: Secondary | ICD-10-CM | POA: Diagnosis not present

## 2023-07-20 DIAGNOSIS — E8889 Other specified metabolic disorders: Secondary | ICD-10-CM | POA: Diagnosis not present

## 2023-08-04 DIAGNOSIS — Z91018 Allergy to other foods: Secondary | ICD-10-CM | POA: Diagnosis not present

## 2023-08-10 DIAGNOSIS — Z6838 Body mass index (BMI) 38.0-38.9, adult: Secondary | ICD-10-CM | POA: Diagnosis not present

## 2023-08-10 DIAGNOSIS — E66812 Obesity, class 2: Secondary | ICD-10-CM | POA: Diagnosis not present

## 2023-08-10 DIAGNOSIS — D500 Iron deficiency anemia secondary to blood loss (chronic): Secondary | ICD-10-CM | POA: Diagnosis not present

## 2023-08-10 DIAGNOSIS — E6609 Other obesity due to excess calories: Secondary | ICD-10-CM | POA: Diagnosis not present

## 2023-08-10 DIAGNOSIS — K5010 Crohn's disease of large intestine without complications: Secondary | ICD-10-CM | POA: Diagnosis not present

## 2023-08-11 DIAGNOSIS — Z7251 High risk heterosexual behavior: Secondary | ICD-10-CM | POA: Diagnosis not present

## 2023-08-11 DIAGNOSIS — Z01419 Encounter for gynecological examination (general) (routine) without abnormal findings: Secondary | ICD-10-CM | POA: Diagnosis not present

## 2023-08-11 DIAGNOSIS — N898 Other specified noninflammatory disorders of vagina: Secondary | ICD-10-CM | POA: Diagnosis not present

## 2023-08-31 DIAGNOSIS — E6609 Other obesity due to excess calories: Secondary | ICD-10-CM | POA: Diagnosis not present

## 2023-08-31 DIAGNOSIS — D500 Iron deficiency anemia secondary to blood loss (chronic): Secondary | ICD-10-CM | POA: Diagnosis not present

## 2023-08-31 DIAGNOSIS — Z6838 Body mass index (BMI) 38.0-38.9, adult: Secondary | ICD-10-CM | POA: Diagnosis not present

## 2023-08-31 DIAGNOSIS — E66812 Obesity, class 2: Secondary | ICD-10-CM | POA: Diagnosis not present

## 2023-08-31 DIAGNOSIS — K5010 Crohn's disease of large intestine without complications: Secondary | ICD-10-CM | POA: Diagnosis not present

## 2023-10-20 ENCOUNTER — Other Ambulatory Visit: Payer: Self-pay

## 2023-10-20 ENCOUNTER — Encounter: Payer: Self-pay | Admitting: Family

## 2023-10-20 ENCOUNTER — Other Ambulatory Visit (HOSPITAL_BASED_OUTPATIENT_CLINIC_OR_DEPARTMENT_OTHER): Payer: Self-pay

## 2023-12-01 ENCOUNTER — Encounter: Payer: Self-pay | Admitting: Family

## 2023-12-01 ENCOUNTER — Other Ambulatory Visit (HOSPITAL_BASED_OUTPATIENT_CLINIC_OR_DEPARTMENT_OTHER): Payer: Self-pay

## 2023-12-01 MED ORDER — FLUZONE 0.5 ML IM SUSY
0.5000 mL | PREFILLED_SYRINGE | Freq: Once | INTRAMUSCULAR | 0 refills | Status: AC
  Filled 2023-12-01: qty 0.5, 1d supply, fill #0

## 2023-12-22 ENCOUNTER — Inpatient Hospital Stay

## 2023-12-22 ENCOUNTER — Inpatient Hospital Stay: Admitting: Family

## 2024-01-10 ENCOUNTER — Other Ambulatory Visit (HOSPITAL_BASED_OUTPATIENT_CLINIC_OR_DEPARTMENT_OTHER): Payer: Self-pay

## 2024-01-10 ENCOUNTER — Encounter: Payer: Self-pay | Admitting: Family

## 2024-01-10 DIAGNOSIS — E282 Polycystic ovarian syndrome: Secondary | ICD-10-CM | POA: Diagnosis not present

## 2024-01-10 DIAGNOSIS — R635 Abnormal weight gain: Secondary | ICD-10-CM | POA: Diagnosis not present

## 2024-01-10 DIAGNOSIS — D509 Iron deficiency anemia, unspecified: Secondary | ICD-10-CM | POA: Diagnosis not present

## 2024-01-10 DIAGNOSIS — K5010 Crohn's disease of large intestine without complications: Secondary | ICD-10-CM | POA: Diagnosis not present

## 2024-01-10 DIAGNOSIS — Z0000 Encounter for general adult medical examination without abnormal findings: Secondary | ICD-10-CM | POA: Diagnosis not present

## 2024-01-10 DIAGNOSIS — K5900 Constipation, unspecified: Secondary | ICD-10-CM | POA: Diagnosis not present

## 2024-01-10 DIAGNOSIS — E7800 Pure hypercholesterolemia, unspecified: Secondary | ICD-10-CM | POA: Diagnosis not present

## 2024-01-10 DIAGNOSIS — Z9889 Other specified postprocedural states: Secondary | ICD-10-CM | POA: Diagnosis not present

## 2024-01-10 DIAGNOSIS — N939 Abnormal uterine and vaginal bleeding, unspecified: Secondary | ICD-10-CM | POA: Diagnosis not present

## 2024-01-10 DIAGNOSIS — F419 Anxiety disorder, unspecified: Secondary | ICD-10-CM | POA: Diagnosis not present

## 2024-01-10 MED ORDER — WEGOVY 0.25 MG/0.5ML ~~LOC~~ SOAJ
0.5000 mL | SUBCUTANEOUS | 1 refills | Status: AC
  Filled 2024-01-10: qty 2, 28d supply, fill #0

## 2024-01-12 ENCOUNTER — Other Ambulatory Visit (HOSPITAL_BASED_OUTPATIENT_CLINIC_OR_DEPARTMENT_OTHER): Payer: Self-pay

## 2024-02-21 ENCOUNTER — Encounter: Payer: Self-pay | Admitting: Family

## 2024-02-28 ENCOUNTER — Inpatient Hospital Stay: Attending: Hematology & Oncology

## 2024-02-28 ENCOUNTER — Encounter: Payer: Self-pay | Admitting: Family

## 2024-02-28 ENCOUNTER — Inpatient Hospital Stay: Admitting: Family

## 2024-02-28 VITALS — BP 131/87 | HR 76 | Temp 98.3°F | Resp 17 | Wt 238.8 lb

## 2024-02-28 DIAGNOSIS — D500 Iron deficiency anemia secondary to blood loss (chronic): Secondary | ICD-10-CM | POA: Diagnosis not present

## 2024-02-28 LAB — RETICULOCYTES
Immature Retic Fract: 5.8 % (ref 2.3–15.9)
RBC.: 4.86 MIL/uL (ref 3.87–5.11)
Retic Count, Absolute: 58.8 K/uL (ref 19.0–186.0)
Retic Ct Pct: 1.2 % (ref 0.4–3.1)

## 2024-02-28 LAB — IRON AND IRON BINDING CAPACITY (CC-WL,HP ONLY)
Iron: 54 ug/dL (ref 28–170)
Saturation Ratios: 14 % (ref 10.4–31.8)
TIBC: 396 ug/dL (ref 250–450)
UIBC: 342 ug/dL

## 2024-02-28 LAB — CBC WITH DIFFERENTIAL (CANCER CENTER ONLY)
Abs Immature Granulocytes: 0.01 K/uL (ref 0.00–0.07)
Basophils Absolute: 0 K/uL (ref 0.0–0.1)
Basophils Relative: 1 %
Eosinophils Absolute: 0.2 K/uL (ref 0.0–0.5)
Eosinophils Relative: 4 %
HCT: 40.4 % (ref 36.0–46.0)
Hemoglobin: 13 g/dL (ref 12.0–15.0)
Immature Granulocytes: 0 %
Lymphocytes Relative: 35 %
Lymphs Abs: 1.7 K/uL (ref 0.7–4.0)
MCH: 26.8 pg (ref 26.0–34.0)
MCHC: 32.2 g/dL (ref 30.0–36.0)
MCV: 83.3 fL (ref 80.0–100.0)
Monocytes Absolute: 0.4 K/uL (ref 0.1–1.0)
Monocytes Relative: 9 %
Neutro Abs: 2.5 K/uL (ref 1.7–7.7)
Neutrophils Relative %: 51 %
Platelet Count: 290 K/uL (ref 150–400)
RBC: 4.85 MIL/uL (ref 3.87–5.11)
RDW: 14.5 % (ref 11.5–15.5)
WBC Count: 4.9 K/uL (ref 4.0–10.5)
nRBC: 0 % (ref 0.0–0.2)

## 2024-02-28 LAB — FERRITIN: Ferritin: 33 ng/mL (ref 11–307)

## 2024-02-28 NOTE — Progress Notes (Signed)
 " Hematology and Oncology Follow Up Visit  Michelle Pacheco [id number] [birth year] 39 y.o. 02/28/2024   Principle Diagnosis:  Iron deficiency anemia secondary to heavy cycles and intermittent GI blood loss/malabsorption with Crohn's    Current Therapy:        IV iron as indicated      Interim History:  Michelle Pacheco is here today for follow-up. She is symptomatic with fatigue, persistent headache and dizziness.  She states that she has been under some personal stress at home and had a Crohn's flare last week with blood in the stool.  She is also now on her cycle which is heavy flow.  No other blood loss noted.  No fever, chills, n/v, cough, rash, SOB, chest pain, palpitations, abdominal pain or changes in bowel or bladder habits.  Some numbness and tinging in her extremities that comes and goes.  No swelling noted.  No falls or syncope.  Appetite and hydration are good. Weight is stable at 238 lbs.   ECOG Performance Status: 1 - Symptomatic but completely ambulatory  Medications:  Allergies as of 02/28/2024       Reactions   Bioflavonoids Hives   Latex Hives   Peanuts [peanut Oil] Swelling   Shellfish Allergy Hives, Shortness Of Breath   Citrus Hives        Medication List        Accurate as of February 28, 2024 10:46 AM. If you have any questions, ask your nurse or doctor.          benzonatate  100 MG capsule Commonly known as: TESSALON  Take 1 capsule (100 mg total) by mouth 3 (three) times daily as needed for cough.   cetirizine  10 MG tablet Commonly known as: ZyrTEC  Allergy Take 1 tablet (10 mg total) by mouth 2 (two) times daily. What changed: when to take this   cetirizine  10 MG tablet Commonly known as: ZyrTEC  Allergy Take 1 tablet (10 mg total) by mouth 2 (two) times daily. What changed: Another medication with the same name was changed. Make sure you understand how and when to take each.   EPINEPHrine  0.3 mg/0.3 mL Soaj injection Commonly known as:  EPI-PEN Use as directed as needed for systemic reactions.   EPINEPHrine  0.3 mg/0.3 mL Soaj injection Commonly known as: EPI-PEN Inject 0.3 mg into the muscle as needed for systemic reactions.   fluticasone  50 MCG/ACT nasal spray Commonly known as: FLONASE  Place 2 sprays into both nostrils daily. What changed: additional instructions   mesalamine  0.375 g 24 hr capsule Commonly known as: APRISO  Take 4 capsules (1.5 g total) by mouth in the morning.   mesalamine  0.375 g 24 hr capsule Commonly known as: APRISO  Take 4 capsules (1.5 g total) by mouth every morning.   Olopatadine  HCl 0.2 % Soln Place 1 drop into affected eye(s) once a day or as needed.   polyethylene glycol powder 17 GM/SCOOP powder Commonly known as: GLYCOLAX/MIRALAX Take by mouth daily. As needed   triamcinolone  ointment 0.1 % Commonly known as: KENALOG  Apply 1 Application topically 2 (two) times daily as needed to body only.   triamcinolone  ointment 0.1 % Commonly known as: KENALOG  Apply 1 Application topically 2 (two) times daily or as needed to body only.   Wegovy  0.25 MG/0.5ML Soaj SQ injection Generic drug: semaglutide -weight management Inject 0.25 mg into the skin once a week.        Allergies: Allergies[1]  Past Medical History, Surgical history, Social history, and Family History were reviewed and  updated.  Review of Systems: All other 10 point review of systems is negative.   Physical Exam:  weight is 238 lb 12.8 oz (108.3 kg). Her oral temperature is 98.3 F (36.8 C). Her blood pressure is 131/87 and her pulse is 76. Her respiration is 17 and oxygen saturation is 100%.   Wt Readings from Last 3 Encounters:  02/28/24 238 lb 12.8 oz (108.3 kg)  06/21/23 229 lb 1.3 oz (103.9 kg)  02/22/23 218 lb (98.9 kg)    Ocular: Sclerae unicteric, pupils equal, round and reactive to light Ear-nose-throat: Oropharynx clear, dentition fair Lymphatic: No cervical or supraclavicular adenopathy Lungs no  rales or rhonchi, good excursion bilaterally Heart regular rate and rhythm, no murmur appreciated Abd soft, nontender, positive bowel sounds MSK no focal spinal tenderness, no joint edema Neuro: non-focal, well-oriented, appropriate affect Breasts: Deferred   Lab Results  Component Value Date   WBC 4.9 02/28/2024   HGB 13.0 02/28/2024   HCT 40.4 02/28/2024   MCV 83.3 02/28/2024   PLT 290 02/28/2024   Lab Results  Component Value Date   FERRITIN 26 06/21/2023   IRON 58 06/21/2023   TIBC 400 06/21/2023   UIBC 342 06/21/2023   IRONPCTSAT 15 06/21/2023   Lab Results  Component Value Date   RETICCTPCT 1.2 02/28/2024   RBC 4.86 02/28/2024   No results found for: KPAFRELGTCHN, LAMBDASER, KAPLAMBRATIO No results found for: IGGSERUM, IGA, IGMSERUM No results found for: STEPHANY CARLOTA BENSON MARKEL EARLA JOANNIE DOC VICK, SPEI   Chemistry      Component Value Date/Time   NA 138 06/30/2020 0855   NA 140 06/19/2018 0813   K 3.9 06/30/2020 0855   CL 103 06/30/2020 0855   CO2 28 06/30/2020 0855   BUN 14 06/30/2020 0855   BUN 13 06/19/2018 0813   CREATININE 0.88 06/30/2020 0855      Component Value Date/Time   CALCIUM 9.8 06/30/2020 0855   ALKPHOS 64 06/30/2020 0855   AST 20 06/30/2020 0855   ALT 21 06/30/2020 0855   BILITOT 0.3 06/30/2020 0855       Impression and Plan:  Michelle Pacheco is a very pleasant 39 yo African American female with history of iron deficiency anemia secondary to heavy cycles and intermittent GI blood loss/malabsorption with Crohn's. Iron studies are pending.  Follow-up in 1 year per patient preference.    Lauraine Pepper, NP 1/14/202610:46 AM     [1]  Allergies Allergen Reactions   Bioflavonoids Hives   Latex Hives   Peanuts [Peanut Oil] Swelling   Shellfish Allergy Hives and Shortness Of Breath   Citrus Hives   "

## 2024-03-01 ENCOUNTER — Other Ambulatory Visit: Payer: Self-pay | Admitting: Family

## 2024-03-04 ENCOUNTER — Telehealth: Payer: Self-pay

## 2024-03-04 NOTE — Telephone Encounter (Signed)
 Patient requesting a call to discuss lab results. RN returned call as requested and answered questions to patient's satisfaction. Scheduling message sent to get patient set up for IV iron infusion x 1.

## 2024-03-13 ENCOUNTER — Inpatient Hospital Stay

## 2024-03-21 ENCOUNTER — Telehealth: Payer: Self-pay | Admitting: Pharmacist

## 2024-03-21 NOTE — Progress Notes (Signed)
" ° °  03/21/2024  Patient ID: Michelle Pacheco Agent, female   DOB: [birth year], 39 y.o.   MRN: [id number]  Received referral request from Dr. Vernon regarding weight loss medication options for the patient- requested [facility] pharmacist referral.   Reviewed options with both Dr. Vernon and Dr. Aura [facility]). Medication considerations were related to oral option only (no injections) and safety related to Crohn's flares.  Patient declined Topiramate option offered by Dr. Vernon. Will work on re-establishing with Dr. Aura for weight management at this time. Can discuss other options during that timeframe as well.    Aloysius Lewis, PharmD, [facility] Phone Number: [phone]  "

## 2025-02-26 ENCOUNTER — Inpatient Hospital Stay

## 2025-02-26 ENCOUNTER — Inpatient Hospital Stay: Admitting: Family
